# Patient Record
Sex: Female | Born: 2000
Health system: Southern US, Community
[De-identification: ages and names within clinical notes are randomized; demographics above are authoritative.]

## PROBLEM LIST (undated history)

## (undated) DIAGNOSIS — D649 Anemia, unspecified: Secondary | ICD-10-CM

## (undated) DIAGNOSIS — G4730 Sleep apnea, unspecified: Secondary | ICD-10-CM

## (undated) DIAGNOSIS — A6000 Herpesviral infection of urogenital system, unspecified: Secondary | ICD-10-CM

## (undated) DIAGNOSIS — K219 Gastro-esophageal reflux disease without esophagitis: Secondary | ICD-10-CM

## (undated) DIAGNOSIS — R0602 Shortness of breath: Secondary | ICD-10-CM

## (undated) DIAGNOSIS — R7303 Prediabetes: Secondary | ICD-10-CM

## (undated) DIAGNOSIS — R519 Headache, unspecified: Secondary | ICD-10-CM

## (undated) DIAGNOSIS — J45909 Unspecified asthma, uncomplicated: Secondary | ICD-10-CM

## (undated) DIAGNOSIS — R5383 Other fatigue: Secondary | ICD-10-CM

## (undated) DIAGNOSIS — T7840XA Allergy, unspecified, initial encounter: Secondary | ICD-10-CM

## (undated) DIAGNOSIS — E282 Polycystic ovarian syndrome: Secondary | ICD-10-CM

## (undated) DIAGNOSIS — Z87898 Personal history of other specified conditions: Secondary | ICD-10-CM

## (undated) HISTORY — PX: TONSILLECTOMY: SUR1361

## (undated) HISTORY — DX: Unspecified asthma, uncomplicated: J45.909

## (undated) HISTORY — DX: Other fatigue: R53.83

## (undated) HISTORY — DX: Sleep apnea, unspecified: G47.30

## (undated) HISTORY — DX: Anemia, unspecified: D64.9

## (undated) HISTORY — DX: Allergy, unspecified, initial encounter: T78.40XA

## (undated) HISTORY — DX: Shortness of breath: R06.02

## (undated) HISTORY — DX: Polycystic ovarian syndrome: E28.2

## (undated) HISTORY — DX: Herpesviral infection of urogenital system, unspecified: A60.00

## (undated) HISTORY — DX: Headache, unspecified: R51.9

## (undated) HISTORY — DX: Prediabetes: R73.03

## (undated) HISTORY — DX: Gastro-esophageal reflux disease without esophagitis: K21.9

---
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
## 2018-02-22 ENCOUNTER — Other Ambulatory Visit: Payer: Self-pay

## 2018-02-22 ENCOUNTER — Encounter (HOSPITAL_COMMUNITY): Payer: Self-pay

## 2018-02-22 ENCOUNTER — Emergency Department (HOSPITAL_COMMUNITY)
Admission: EM | Admit: 2018-02-22 | Discharge: 2018-02-22 | Disposition: A | Discharge status: Home / Self Care | Payer: Medicaid Other | Attending: Emergency Medicine | Admitting: Emergency Medicine

## 2018-02-22 DIAGNOSIS — J029 Acute pharyngitis, unspecified: Secondary | ICD-10-CM | POA: Diagnosis present

## 2018-02-22 DIAGNOSIS — R69 Illness, unspecified: Secondary | ICD-10-CM

## 2018-02-22 DIAGNOSIS — J111 Influenza due to unidentified influenza virus with other respiratory manifestations: Secondary | ICD-10-CM | POA: Insufficient documentation

## 2018-02-22 LAB — GROUP A STREP BY PCR: GROUP A STREP BY PCR: NOT DETECTED

## 2018-02-22 MED ORDER — ACETAMINOPHEN 325 MG PO TABS
650.0000 mg | ORAL_TABLET | Freq: Once | ORAL | Status: AC
  Administered 2018-02-22: 650 mg via ORAL
  Filled 2018-02-22: qty 2

## 2018-02-22 MED ORDER — KETOROLAC TROMETHAMINE 30 MG/ML IJ SOLN
30.0000 mg | Freq: Once | INTRAMUSCULAR | Status: AC
  Administered 2018-02-22: 30 mg via INTRAMUSCULAR
  Filled 2018-02-22: qty 1

## 2018-02-22 NOTE — ED Triage Notes (Signed)
Pt states she has had a sore throat x 2 days. Pt also states generalized abdominal pain. Pt states she has been cold.  Pt states no relief with Nyquil and Robotussin.

## 2018-02-22 NOTE — Discharge Instructions (Signed)
Please read and follow all provided instructions.  Your diagnoses today include:  1. Influenza-like illness     Tests performed today include:  Strep test - negative  Vital signs. See below for your results today.   Medications prescribed:   Naproxen - anti-inflammatory pain medication  Do not exceed 500mg  naproxen every 12 hours, take with food  You have been prescribed an anti-inflammatory medication or NSAID. Take with food. Take smallest effective dose for the shortest duration needed for your pain. Stop taking if you experience stomach pain or vomiting.   Take any prescribed medications only as directed.  Home care instructions:  Follow any educational materials contained in this packet. Please continue drinking plenty of fluids. Use over-the-counter cold and flu medications as needed as directed on packaging for symptom relief. You may also use ibuprofen or tylenol as directed on packaging for pain or fever.   BE VERY CAREFUL not to take multiple medicines containing Tylenol (also called acetaminophen). Doing so can lead to an overdose which can damage your liver and cause liver failure and possibly death.   Follow-up instructions: Please follow-up with your primary care provider in the next 3 days for further evaluation of your symptoms.   Return instructions:   Please return to the Emergency Department if you experience worsening symptoms.  Please return if you have a high fever greater than 101 degrees not controlled with over-the-counter medications, persistent vomiting and cannot keep down fluids, or worsening trouble breathing.  Please return if you have any other emergent concerns.  Additional Information:  Your vital signs today were: BP 119/76 (BP Location: Left Arm)    Pulse (!) 119    Temp (!) 101.6 F (38.7 C) (Oral)    Resp 20    Ht 5\' 2"  (1.575 m)    Wt 95.3 kg    SpO2 100%    BMI 38.41 kg/m  If your blood pressure (BP) was elevated above 135/85 this  visit, please have this repeated by your doctor within one month.

## 2018-02-22 NOTE — ED Provider Notes (Signed)
[facility]-EMERGENCY DEPT Provider Note   CSN: [id number] Arrival date & [id number]: 02/22/18  0935     History   Chief Complaint Chief Complaint  Patient presents with  . Sore Throat  . Abdominal Pain    HPI Peggy Garza is a 18 y.o. female.  Patient presents emergency department with flulike symptoms over the past 2 days.  Patient complains of sore throat, body aches, fever/chills, and left upper quadrant abdominal pain.  No nausea, vomiting, or diarrhea.  She denies cough or chest pain.  She has been taking NyQuil and Robitussin without any improvement.  No known sick contacts.     History reviewed. No pertinent past medical history.  There are no active problems to display for this patient.   Past Surgical History:  Procedure Laterality Date  . TONSILLECTOMY       OB History   No obstetric history on file.      Home Medications    Prior to Admission medications   Not on File    Family History No family history on file.  Social History Social History   Tobacco Use  . Smoking status: Never Smoker  Substance Use Topics  . Alcohol use: Never    Frequency: Never  . Drug use: Never     Allergies   Patient has no known allergies.   Review of Systems Review of Systems  Constitutional: Positive for chills, fatigue and fever.  HENT: Positive for sore throat. Negative for congestion, ear pain, rhinorrhea and sinus pressure.   Eyes: Negative for redness.  Respiratory: Negative for cough and wheezing.   Cardiovascular: Negative for chest pain.  Gastrointestinal: Positive for abdominal pain. Negative for diarrhea, nausea and vomiting.  Genitourinary: Negative for dysuria.  Musculoskeletal: Positive for myalgias. Negative for neck stiffness.  Skin: Negative for rash.  Neurological: Negative for headaches.  Hematological: Negative for adenopathy.     Physical Exam Updated Vital Signs BP 119/76 (BP Location: Left Arm)   Pulse  (!) 119   Temp (!) 101.6 F (38.7 C) (Oral)   Resp 20   Ht 5\' 2"  (1.575 m)   Wt 95.3 kg   SpO2 100%   BMI 38.41 kg/m   Physical Exam Vitals signs and nursing note reviewed.  Constitutional:      Appearance: She is well-developed.  HENT:     Head: Normocephalic and atraumatic.     Right Ear: Tympanic membrane normal.     Left Ear: Tympanic membrane normal.     Mouth/Throat:     Mouth: Mucous membranes are moist.     Pharynx: Posterior oropharyngeal erythema present. No pharyngeal swelling or oropharyngeal exudate.     Tonsils: No tonsillar exudate or tonsillar abscesses.  Eyes:     General:        Right eye: No discharge.        Left eye: No discharge.     Conjunctiva/sclera: Conjunctivae normal.  Neck:     Musculoskeletal: Normal range of motion and neck supple.  Cardiovascular:     Rate and Rhythm: Regular rhythm. Tachycardia present.     Heart sounds: Normal heart sounds.  Pulmonary:     Effort: Pulmonary effort is normal.     Breath sounds: Normal breath sounds.  Abdominal:     Palpations: Abdomen is soft.     Tenderness: There is abdominal tenderness.     Comments: Minimal left upper quadrant tenderness  Skin:    General: Skin is warm and  dry.  Neurological:     Mental Status: She is alert.      ED Treatments / Results  Labs (all labs ordered are listed, but only abnormal results are displayed) Labs Reviewed  GROUP A STREP BY PCR    EKG None  Radiology No results found.  Procedures Procedures (including critical care time)  Medications Ordered in ED Medications  acetaminophen (TYLENOL) tablet 650 mg (650 mg Oral Given 02/22/18 1009)  ketorolac (TORADOL) 30 MG/ML injection 30 mg (30 mg Intramuscular Given 02/22/18 1009)     Initial Impression / Assessment and Plan / ED Course  I have reviewed the triage vital signs and the nursing notes.  Pertinent labs & imaging results that were available during my care of the patient were reviewed by me and  considered in my medical decision making (see chart for details).     Patient seen and examined. Work-up initiated --we will check strep test is primary complaint is sore throat today. Medications ordered.   Vital signs reviewed and are as follows: BP 119/76 (BP Location: Left Arm)   Pulse (!) 119   Temp (!) 101.6 F (38.7 C) (Oral)   Resp 20   Ht 5\' 2"  (1.575 m)   Wt 95.3 kg   SpO2 100%   BMI 38.41 kg/m   11:54 AM patient updated on negative strep test.  Fever is improving.  Encouraged to rest and drink plenty of fluids.  Patient told to return to ED or see their primary doctor if their symptoms worsen, high fever not controlled with tylenol, persistent vomiting, they feel they are dehydrated, or if they have any other concerns.  Patient verbalized understanding and agreed with plan.     Final Clinical Impressions(s) / ED Diagnoses   Final diagnoses:  Influenza-like illness   Patient with symptoms consistent with influenza. Strep neg.  No sign of PTA.  Vitals are stable, low-grade fever. No signs of dehydration, tolerating PO's. Lungs are clear. Supportive therapy indicated with return if symptoms worsen. Patient counseled.   ED Discharge Orders    None       Renne Crigler, Cordelia Poche 02/22/18 1525    Azalia Bilis, MD 02/22/18 [phone]

## 2018-04-06 ENCOUNTER — Encounter: Payer: Self-pay | Admitting: Obstetrics & Gynecology

## 2018-04-27 ENCOUNTER — Encounter: Payer: Self-pay | Admitting: *Deleted

## 2018-08-24 ENCOUNTER — Telehealth: Payer: Self-pay | Admitting: Family Medicine

## 2018-08-24 NOTE — Telephone Encounter (Signed)
Patient called in stating that she needs to reschedule her appointment because she has to work. Other appointment day/times were give to the patient but they did not work for her. Patient verbalized that she will just keep her appointment on 7/16 and work it out with her job. Patient instructed to give the office a call back if it does not work out so she can be rescheduled.

## 2018-08-26 ENCOUNTER — Encounter: Payer: Medicaid Other | Admitting: Obstetrics & Gynecology

## 2018-08-26 ENCOUNTER — Encounter: Payer: Self-pay | Admitting: Obstetrics & Gynecology

## 2018-10-21 ENCOUNTER — Encounter: Payer: Medicaid Other | Admitting: Obstetrics & Gynecology

## 2018-11-10 ENCOUNTER — Telehealth: Payer: Self-pay | Admitting: Women's Health

## 2018-11-10 NOTE — Telephone Encounter (Signed)
Attempted to call patient about her appointment on 10/1 @ 10:35. No answer left voicemail instructing patient to wear a face mask for the entire appointment and no visitors are allowed during the visit. Patient instructed not to attend the appointment if she was any symptoms. Symptom list and office number left.  °

## 2018-11-11 ENCOUNTER — Encounter: Payer: Medicaid Other | Admitting: Women's Health

## 2019-03-01 DIAGNOSIS — A6000 Herpesviral infection of urogenital system, unspecified: Secondary | ICD-10-CM | POA: Insufficient documentation

## 2019-03-01 DIAGNOSIS — K8020 Calculus of gallbladder without cholecystitis without obstruction: Secondary | ICD-10-CM | POA: Insufficient documentation

## 2019-03-28 ENCOUNTER — Ambulatory Visit (INDEPENDENT_AMBULATORY_CARE_PROVIDER_SITE_OTHER): Payer: 59 | Admitting: Family Medicine

## 2019-03-28 ENCOUNTER — Encounter: Payer: Self-pay | Admitting: Family Medicine

## 2019-03-28 ENCOUNTER — Other Ambulatory Visit: Payer: Self-pay

## 2019-03-28 ENCOUNTER — Other Ambulatory Visit (HOSPITAL_COMMUNITY)
Admission: RE | Admit: 2019-03-28 | Discharge: 2019-03-28 | Disposition: A | Discharge status: Home / Self Care | Payer: 59 | Source: Ambulatory Visit | Attending: Family Medicine | Admitting: Family Medicine

## 2019-03-28 VITALS — BP 115/76 | HR 99 | Wt 223.5 lb

## 2019-03-28 DIAGNOSIS — E6609 Other obesity due to excess calories: Secondary | ICD-10-CM | POA: Diagnosis not present

## 2019-03-28 DIAGNOSIS — Z23 Encounter for immunization: Secondary | ICD-10-CM

## 2019-03-28 DIAGNOSIS — E282 Polycystic ovarian syndrome: Secondary | ICD-10-CM

## 2019-03-28 DIAGNOSIS — N898 Other specified noninflammatory disorders of vagina: Secondary | ICD-10-CM | POA: Insufficient documentation

## 2019-03-28 MED ORDER — METFORMIN HCL 500 MG PO TABS: ORAL_TABLET | ORAL | 5 refills | Status: DC

## 2019-03-28 MED ORDER — CLOTRIMAZOLE-BETAMETHASONE 1-0.05 % EX CREA: 1.0000 "application " | TOPICAL_CREAM | Freq: Two times a day (BID) | CUTANEOUS | 0 refills | Status: DC

## 2019-03-28 MED ORDER — NORETHIN ACE-ETH ESTRAD-FE 1-20 MG-MCG(24) PO TABS: 1.0000 | ORAL_TABLET | Freq: Every day | ORAL | 3 refills | Status: DC

## 2019-03-28 NOTE — Progress Notes (Signed)
Started menstruating at 19 yrs old. Began depo at approx 19 yrs old; pt reports being on Depo Provera for approx 3-4 years. Pt reports bleeding for 3 months after discontinuing Depo in 2018. No period since that time. Pt reports trying for pregnancy for 1 year.  Reports taking Provera twice about a year ago with no period afterwards.  Fleet Contras RN  03/28/19

## 2019-03-28 NOTE — Patient Instructions (Signed)

## 2019-03-28 NOTE — Progress Notes (Signed)
   Subjective:    Patient ID: Peggy Garza, female    DOB: Apr 15, 2000, 19 y.o.   MRN: [id number]  HPI  Patient is a G0, seen for secondary amenorrhea. Has history of irregular menses, starting as teenager. Started menses at age 41, menses every 3 months. At 14, was started on depo. Was on depo for a couple of years, but got off at age 44. Bleed for 3 months, then hasn't had menses since then. Had two provera challenges - about a year apart. No bleeding afterwards and no bleeding in between.  Has history of hair growth on chin and neck since teenager. Has strong family history of hirsutism with irregular cycles.   No galactorrhea, no new medications. No weight loss, weight gain.  I have reviewed the patients past medical, family, and social history.  I have reviewed the patient's medication list and allergies.   Review of Systems  BP 115/76   Pulse 99   Wt 223 lb 8 oz (101.4 kg)   BMI 40.88 kg/m      Objective:   Physical Exam Vitals reviewed.  Constitutional:      Appearance: Normal appearance.  HENT:     Head: Normocephalic and atraumatic.  Cardiovascular:     Rate and Rhythm: Normal rate and regular rhythm.     Pulses: Normal pulses.     Heart sounds: Normal heart sounds.  Pulmonary:     Effort: Pulmonary effort is normal.     Breath sounds: Normal breath sounds.  Abdominal:     General: Abdomen is flat. Bowel sounds are normal.     Palpations: Abdomen is soft.  Skin:    General: Skin is warm and dry.     Capillary Refill: Capillary refill takes less than 2 seconds.     Comments: Moderate Hirsutism on chin and neck  Neurological:     General: No focal deficit present.     Mental Status: She is alert.  Psychiatric:        Mood and Affect: Mood normal.        Behavior: Behavior normal.        Thought Content: Thought content normal.        Judgment: Judgment normal.       Assessment & Plan:  1. PCOS (polycystic ovarian syndrome) Check TSH, testosterone,  HgA1c. Discussed weight loss, even 5-10 pounds can improve testosterone levels and fertility. Patient does want to get pregnant in the next couple of years. Start metformin and OCPs. Will refer to obesity medicine (Dr Rinaldo Ratel). Will also refer to nutritionist.  - TSH - TestT+TestF+SHBG - Hemoglobin A1c - Ambulatory referral to [facility] - Referral to Nutrition and Diabetes Services  2. Obesity due to excess calories without serious comorbidity, unspecified classification - Ambulatory referral to [facility] - Referral to Nutrition and Diabetes Services  3. Vaginal discharge - Cervicovaginal ancillary only( [place])

## 2019-03-31 ENCOUNTER — Other Ambulatory Visit: Payer: Medicaid Other

## 2019-03-31 LAB — CERVICOVAGINAL ANCILLARY ONLY
Bacterial Vaginitis (gardnerella): POSITIVE — AB
Candida Glabrata: NEGATIVE
Candida Vaginitis: NEGATIVE
Chlamydia: NEGATIVE
Comment: NEGATIVE
Comment: NEGATIVE
Comment: NEGATIVE
Comment: NEGATIVE
Comment: NEGATIVE
Comment: NORMAL
Neisseria Gonorrhea: NEGATIVE
Trichomonas: NEGATIVE

## 2019-04-01 LAB — TESTT+TESTF+SHBG
Sex Hormone Binding: 26.8 nmol/L (ref 24.6–122.0)
Testosterone, Free: 6.1 pg/mL — ABNORMAL HIGH (ref 0.0–4.2)
Testosterone, Total, LC/MS: 108.8 ng/dL — ABNORMAL HIGH (ref 10.0–55.0)

## 2019-04-01 LAB — TSH: TSH: 0.516 u[IU]/mL (ref 0.450–4.500)

## 2019-04-01 LAB — HEMOGLOBIN A1C
Est. average glucose Bld gHb Est-mCnc: 117 mg/dL
Hgb A1c MFr Bld: 5.7 % — ABNORMAL HIGH (ref 4.8–5.6)

## 2019-04-04 ENCOUNTER — Telehealth: Payer: Self-pay | Admitting: Lactation Services

## 2019-04-04 ENCOUNTER — Telehealth (HOSPITAL_COMMUNITY): Payer: Self-pay | Admitting: Lactation Services

## 2019-04-04 ENCOUNTER — Telehealth: Payer: Self-pay | Admitting: *Deleted

## 2019-04-04 ENCOUNTER — Other Ambulatory Visit: Payer: Self-pay | Admitting: Family Medicine

## 2019-04-04 MED ORDER — METRONIDAZOLE 500 MG PO TABS: 500.0000 mg | ORAL_TABLET | Freq: Two times a day (BID) | ORAL | 0 refills | Status: DC

## 2019-04-04 NOTE — Telephone Encounter (Signed)
-----   Message from Levie Heritage, DO sent at 04/04/2019 11:49 AM EST ----- Regarding: RE: Treatment for BV? Thank you - I sent a prescription for flagyl to the pharmacy for her. No alcohol while on this medication. ----- Message ----- From: Ed Blalock, RN Sent: 04/04/2019   9:52 AM EST To: Levie Heritage, DO Subject: Treatment for BV?                              Dr. Adrian Blackwater,   Do you advise treatment for BV? I attempted to reach patient earlier today with other results and left a message for her to call back.   Thanks Jasmine December, RN

## 2019-04-04 NOTE — Telephone Encounter (Signed)
Received voicemail message stating she is calling for results.  Jakell Trusty,RN

## 2019-04-04 NOTE — Telephone Encounter (Signed)
-----   Message from Levie Heritage, DO sent at 04/01/2019 12:47 PM EST ----- Patient has PCOS and pre-diabetes. Continue with metformin and OCPs. Please let patient know.

## 2019-04-04 NOTE — Telephone Encounter (Signed)
Called patient and informed her that she is prediabetic and has PCOS and to keep taking OCP's and Metformin. . Patient informed she has BV and antibiotics called into pharmacy. Pt advised not to drink alcohol while taking Flagyl. She reports she has frequent BV but it is not usually bothersome. She reports she uses Dove soap and wears Polyester underwear. She does not douche. Advised her to try 100% cotton underwear to see if that helps. Pt to let us know if she has symptoms after taking ATB. Pt voiced understanding to all the above.

## 2019-04-04 NOTE — Telephone Encounter (Signed)
Called patient to inform her of her lab results and recommendations. Pt did not answer. Left message for patient to call the office for her lab results.

## 2019-04-04 NOTE — Telephone Encounter (Signed)
This was addressed in another telephone encounter also dated today. Kanan Sobek,RN

## 2019-06-15 DIAGNOSIS — Z0000 Encounter for general adult medical examination without abnormal findings: Secondary | ICD-10-CM | POA: Diagnosis not present

## 2019-06-15 DIAGNOSIS — Z124 Encounter for screening for malignant neoplasm of cervix: Secondary | ICD-10-CM | POA: Diagnosis not present

## 2019-06-15 DIAGNOSIS — E282 Polycystic ovarian syndrome: Secondary | ICD-10-CM | POA: Insufficient documentation

## 2019-06-15 DIAGNOSIS — E669 Obesity, unspecified: Secondary | ICD-10-CM | POA: Diagnosis not present

## 2019-06-22 DIAGNOSIS — R7303 Prediabetes: Secondary | ICD-10-CM | POA: Insufficient documentation

## 2019-06-22 DIAGNOSIS — E559 Vitamin D deficiency, unspecified: Secondary | ICD-10-CM | POA: Insufficient documentation

## 2019-07-14 ENCOUNTER — Ambulatory Visit: Payer: 59 | Admitting: Family Medicine

## 2019-08-13 DIAGNOSIS — N63 Unspecified lump in unspecified breast: Secondary | ICD-10-CM | POA: Diagnosis not present

## 2019-08-13 DIAGNOSIS — A609 Anogenital herpesviral infection, unspecified: Secondary | ICD-10-CM | POA: Diagnosis not present

## 2019-08-13 DIAGNOSIS — Z309 Encounter for contraceptive management, unspecified: Secondary | ICD-10-CM | POA: Diagnosis not present

## 2019-08-29 ENCOUNTER — Other Ambulatory Visit: Payer: Self-pay | Admitting: Urgent Care

## 2019-08-29 ENCOUNTER — Ambulatory Visit
Admission: RE | Admit: 2019-08-29 | Discharge: 2019-08-29 | Disposition: A | Discharge status: Home / Self Care | Payer: BC Managed Care – PPO | Source: Ambulatory Visit | Attending: Urgent Care | Admitting: Urgent Care

## 2019-08-29 ENCOUNTER — Other Ambulatory Visit: Payer: Self-pay

## 2019-08-29 DIAGNOSIS — N63 Unspecified lump in unspecified breast: Secondary | ICD-10-CM

## 2019-08-29 DIAGNOSIS — N611 Abscess of the breast and nipple: Secondary | ICD-10-CM

## 2019-08-29 DIAGNOSIS — N6310 Unspecified lump in the right breast, unspecified quadrant: Secondary | ICD-10-CM | POA: Diagnosis not present

## 2019-08-29 DIAGNOSIS — N61 Mastitis without abscess: Secondary | ICD-10-CM | POA: Diagnosis not present

## 2019-09-09 ENCOUNTER — Other Ambulatory Visit: Payer: Self-pay

## 2019-09-09 ENCOUNTER — Ambulatory Visit
Admission: RE | Admit: 2019-09-09 | Discharge: 2019-09-09 | Disposition: A | Discharge status: Home / Self Care | Payer: BC Managed Care – PPO | Source: Ambulatory Visit | Attending: Urgent Care | Admitting: Urgent Care

## 2019-09-09 DIAGNOSIS — N611 Abscess of the breast and nipple: Secondary | ICD-10-CM

## 2019-10-19 DIAGNOSIS — Z6841 Body Mass Index (BMI) 40.0 and over, adult: Secondary | ICD-10-CM | POA: Diagnosis not present

## 2019-10-19 DIAGNOSIS — G43909 Migraine, unspecified, not intractable, without status migrainosus: Secondary | ICD-10-CM | POA: Diagnosis not present

## 2019-10-19 DIAGNOSIS — N644 Mastodynia: Secondary | ICD-10-CM | POA: Diagnosis not present

## 2019-10-21 ENCOUNTER — Other Ambulatory Visit: Payer: Self-pay

## 2019-10-21 ENCOUNTER — Other Ambulatory Visit: Payer: Self-pay | Admitting: Family

## 2019-10-21 DIAGNOSIS — N644 Mastodynia: Secondary | ICD-10-CM

## 2019-11-03 ENCOUNTER — Other Ambulatory Visit: Payer: BC Managed Care – PPO

## 2019-11-07 ENCOUNTER — Ambulatory Visit: Payer: BC Managed Care – PPO | Admitting: Family Medicine

## 2019-12-09 ENCOUNTER — Other Ambulatory Visit: Payer: BC Managed Care – PPO

## 2019-12-20 ENCOUNTER — Other Ambulatory Visit: Payer: BC Managed Care – PPO

## 2020-01-13 ENCOUNTER — Other Ambulatory Visit: Payer: BC Managed Care – PPO

## 2020-01-25 ENCOUNTER — Ambulatory Visit
Admission: RE | Admit: 2020-01-25 | Discharge: 2020-01-25 | Disposition: A | Discharge status: Home / Self Care | Payer: BC Managed Care – PPO | Source: Ambulatory Visit | Attending: Family | Admitting: Family

## 2020-01-25 ENCOUNTER — Other Ambulatory Visit: Payer: Self-pay

## 2020-01-25 DIAGNOSIS — N6489 Other specified disorders of breast: Secondary | ICD-10-CM | POA: Diagnosis not present

## 2020-01-25 DIAGNOSIS — N644 Mastodynia: Secondary | ICD-10-CM

## 2020-01-26 ENCOUNTER — Ambulatory Visit: Payer: Medicaid Other | Admitting: Neurology

## 2020-01-26 DIAGNOSIS — Z209 Contact with and (suspected) exposure to unspecified communicable disease: Secondary | ICD-10-CM | POA: Diagnosis not present

## 2020-01-28 DIAGNOSIS — M79661 Pain in right lower leg: Secondary | ICD-10-CM | POA: Diagnosis not present

## 2020-01-28 DIAGNOSIS — Z041 Encounter for examination and observation following transport accident: Secondary | ICD-10-CM | POA: Diagnosis not present

## 2020-01-28 DIAGNOSIS — M79674 Pain in right toe(s): Secondary | ICD-10-CM | POA: Diagnosis not present

## 2020-02-25 DIAGNOSIS — N61 Mastitis without abscess: Secondary | ICD-10-CM | POA: Diagnosis not present

## 2020-02-28 ENCOUNTER — Ambulatory Visit (INDEPENDENT_AMBULATORY_CARE_PROVIDER_SITE_OTHER): Payer: Medicaid Other | Admitting: Family Medicine

## 2020-03-06 ENCOUNTER — Encounter (INDEPENDENT_AMBULATORY_CARE_PROVIDER_SITE_OTHER): Payer: Self-pay | Admitting: Family Medicine

## 2020-03-06 ENCOUNTER — Other Ambulatory Visit: Payer: Self-pay

## 2020-03-06 ENCOUNTER — Other Ambulatory Visit: Payer: Self-pay | Admitting: Urgent Care

## 2020-03-06 ENCOUNTER — Ambulatory Visit
Admission: RE | Admit: 2020-03-06 | Discharge: 2020-03-06 | Disposition: A | Discharge status: Home / Self Care | Payer: BC Managed Care – PPO | Source: Ambulatory Visit | Attending: Urgent Care | Admitting: Urgent Care

## 2020-03-06 ENCOUNTER — Ambulatory Visit (INDEPENDENT_AMBULATORY_CARE_PROVIDER_SITE_OTHER): Payer: BC Managed Care – PPO | Admitting: Family Medicine

## 2020-03-06 VITALS — BP 94/65 | HR 83 | Temp 98.3°F | Ht 61.0 in | Wt 239.0 lb

## 2020-03-06 DIAGNOSIS — Z1331 Encounter for screening for depression: Secondary | ICD-10-CM | POA: Diagnosis not present

## 2020-03-06 DIAGNOSIS — R739 Hyperglycemia, unspecified: Secondary | ICD-10-CM | POA: Diagnosis not present

## 2020-03-06 DIAGNOSIS — N611 Abscess of the breast and nipple: Secondary | ICD-10-CM

## 2020-03-06 DIAGNOSIS — Z9189 Other specified personal risk factors, not elsewhere classified: Secondary | ICD-10-CM

## 2020-03-06 DIAGNOSIS — R5383 Other fatigue: Secondary | ICD-10-CM

## 2020-03-06 DIAGNOSIS — Z0289 Encounter for other administrative examinations: Secondary | ICD-10-CM

## 2020-03-06 DIAGNOSIS — R0602 Shortness of breath: Secondary | ICD-10-CM | POA: Diagnosis not present

## 2020-03-06 DIAGNOSIS — Z6841 Body Mass Index (BMI) 40.0 and over, adult: Secondary | ICD-10-CM

## 2020-03-07 LAB — T3: T3, Total: 154 ng/dL (ref 71–180)

## 2020-03-07 LAB — COMPREHENSIVE METABOLIC PANEL
ALT: 8 IU/L (ref 0–32)
AST: 18 IU/L (ref 0–40)
Albumin/Globulin Ratio: 1 — ABNORMAL LOW (ref 1.2–2.2)
Albumin: 3.8 g/dL — ABNORMAL LOW (ref 3.9–5.0)
Alkaline Phosphatase: 49 IU/L (ref 44–121)
BUN/Creatinine Ratio: 10 (ref 9–23)
BUN: 8 mg/dL (ref 6–20)
Bilirubin Total: 0.3 mg/dL (ref 0.0–1.2)
CO2: 22 mmol/L (ref 20–29)
Calcium: 9.3 mg/dL (ref 8.7–10.2)
Chloride: 104 mmol/L (ref 96–106)
Creatinine, Ser: 0.79 mg/dL (ref 0.57–1.00)
GFR calc Af Amer: 124 mL/min/{1.73_m2} (ref >59)
GFR calc non Af Amer: 107 mL/min/{1.73_m2} (ref >59)
Globulin, Total: 4 g/dL (ref 1.5–4.5)
Glucose: 84 mg/dL (ref 65–99)
Potassium: 4.3 mmol/L (ref 3.5–5.2)
Sodium: 138 mmol/L (ref 134–144)
Total Protein: 7.8 g/dL (ref 6.0–8.5)

## 2020-03-07 LAB — CBC WITH DIFFERENTIAL/PLATELET
Basophils Absolute: 0 10*3/uL (ref 0.0–0.2)
Basos: 1 %
EOS (ABSOLUTE): 0.1 10*3/uL (ref 0.0–0.4)
Eos: 2 %
Hematocrit: 36.8 % (ref 34.0–46.6)
Hemoglobin: 11.3 g/dL (ref 11.1–15.9)
Immature Grans (Abs): 0 10*3/uL (ref 0.0–0.1)
Immature Granulocytes: 0 %
Lymphocytes Absolute: 3 10*3/uL (ref 0.7–3.1)
Lymphs: 49 %
MCH: 25.3 pg — ABNORMAL LOW (ref 26.6–33.0)
MCHC: 30.7 g/dL — ABNORMAL LOW (ref 31.5–35.7)
MCV: 82 fL (ref 79–97)
Monocytes Absolute: 0.4 10*3/uL (ref 0.1–0.9)
Monocytes: 6 %
Neutrophils Absolute: 2.6 10*3/uL (ref 1.4–7.0)
Neutrophils: 42 %
Platelets: 283 10*3/uL (ref 150–450)
RBC: 4.47 x10E6/uL (ref 3.77–5.28)
RDW: 14.1 % (ref 11.7–15.4)
WBC: 6.1 10*3/uL (ref 3.4–10.8)

## 2020-03-07 LAB — LIPID PANEL WITH LDL/HDL RATIO
Cholesterol, Total: 150 mg/dL (ref 100–199)
HDL: 43 mg/dL (ref >39)
LDL Chol Calc (NIH): 88 mg/dL (ref 0–99)
LDL/HDL Ratio: 2 ratio (ref 0.0–3.2)
Triglycerides: 103 mg/dL (ref 0–149)
VLDL Cholesterol Cal: 19 mg/dL (ref 5–40)

## 2020-03-07 LAB — INSULIN, RANDOM: INSULIN: 28.7 u[IU]/mL — ABNORMAL HIGH (ref 2.6–24.9)

## 2020-03-07 LAB — TSH: TSH: 0.575 u[IU]/mL (ref 0.450–4.500)

## 2020-03-07 LAB — HEMOGLOBIN A1C
Est. average glucose Bld gHb Est-mCnc: 126 mg/dL
Hgb A1c MFr Bld: 6 % — ABNORMAL HIGH (ref 4.8–5.6)

## 2020-03-07 LAB — VITAMIN D 25 HYDROXY (VIT D DEFICIENCY, FRACTURES): Vit D, 25-Hydroxy: 31.6 ng/mL (ref 30.0–100.0)

## 2020-03-07 LAB — FOLATE: Folate: 16.4 ng/mL (ref >3.0)

## 2020-03-07 LAB — VITAMIN B12: Vitamin B-12: 262 pg/mL (ref 232–1245)

## 2020-03-07 LAB — T4: T4, Total: 9.5 ug/dL (ref 4.5–12.0)

## 2020-03-07 NOTE — Progress Notes (Signed)
Chief Complaint:   OBESITY Peggy Garza (MR# [id number]) is a 20 y.o. female who presents for evaluation and treatment of obesity and related comorbidities. Current BMI is Body mass index is 45.16 kg/m. Peggy Garza has been struggling with her weight for many years and has been unsuccessful in either losing weight, maintaining weight loss, or reaching her healthy weight goal.  Peggy Garza states she is a very picky eater, and this limits her food choices. She especially doesn't like to eat fruit or vegetables.  Peggy Garza is currently in the action stage of change and ready to dedicate time achieving and maintaining a healthier weight. Peggy Garza is interested in becoming our patient and working on intensive lifestyle modifications including (but not limited to) diet and exercise for weight loss.  Peggy Garza's habits were reviewed today and are as follows: her desired weight loss is 59 lbs, she has been heavy most of her life, she started gaining weight after the depot shot, her heaviest weight ever was 247 pounds, she is a picky eater and doesn't like to eat healthier foods, she has significant food cravings issues, she skips meals frequently, she frequently makes poor food choices and she struggles with emotional eating.  Depression Screen Peggy Garza's Food and Mood (modified PHQ-9) score was 16.  Depression screen [facility] 2/9 03/06/2020  Decreased Interest 2  Down, Depressed, Hopeless 1  PHQ - 2 Score 3  Altered sleeping 3  Tired, decreased energy 3  Change in appetite 1  Feeling bad or failure about yourself  3  Trouble concentrating 3  Moving slowly or fidgety/restless 0  Suicidal thoughts -  PHQ-9 Score 16   Subjective:   1. Other fatigue Peggy Garza admits to daytime somnolence and admits to waking up still tired. Patent has a history of symptoms of daytime fatigue and morning headache. Peggy Garza generally gets 5 hours of sleep per night, and states that she has generally restful sleep. Snoring is present.  Apneic episodes are present. Epworth Sleepiness Score is 15.  2. Shortness of breath on exertion Keyoni notes increasing shortness of breath with exercising and seems to be worsening over time with weight gain. She notes getting out of breath sooner with activity than she used to. This has not gotten worse recently. Peggy Garza denies shortness of breath at rest or orthopnea.  3. Hyperglycemia Peggy Garza is on metformin, but she takes this infrequently. She notes polyphagia sometimes but not always.  4. At risk for diabetes mellitus Peggy Garza is at higher than average risk for developing diabetes due to obesity.   Assessment/Plan:   1. Other fatigue Peggy Garza does feel that her weight is causing her energy to be lower than it should be. Fatigue may be related to obesity, depression or many other causes. Labs will be ordered, and in the meanwhile, Peggy Garza will focus on self care including making healthy food choices, increasing physical activity and focusing on stress reduction.  - Vitamin B12 - CBC with Differential/Platelet - EKG 12-Lead - Folate - T3 - T4 - TSH - VITAMIN D 25 Hydroxy (Vit-D Deficiency, Fractures)  2. Shortness of breath on exertion Peggy Garza does feel that she gets out of breath more easily that she used to when she exercises. Peggy Garza's shortness of breath appears to be obesity related and exercise induced. She has agreed to work on weight loss and gradually increase exercise to treat her exercise induced shortness of breath. Will continue to monitor closely.  - Lipid Panel With LDL/HDL Ratio  3. Hyperglycemia Fasting labs  will be obtained today, and results with be discussed with Corena in 2 weeks at her follow up visit. In the meanwhile Peggy Garza will start journaling and will work on weight loss efforts.  - Vitamin B12 - Comprehensive metabolic panel - Hemoglobin A1c - Insulin, random  4. Screening for depression Peggy Garza had a positive depression screening. Depression is commonly  associated with obesity and often results in emotional eating behaviors. We will monitor this closely and work on CBT to help improve the non-hunger eating patterns. Referral to Psychology may be required if no improvement is seen as she continues in our clinic.  5. At risk for diabetes mellitus Peggy Garza was given approximately 15 minutes of diabetes education and counseling today. We discussed intensive lifestyle modifications today with an emphasis on weight loss as well as increasing exercise and decreasing simple carbohydrates in her diet. We also reviewed medication options with an emphasis on risk versus benefit of those discussed.   Repetitive spaced learning was employed today to elicit superior memory formation and behavioral change.  6. Class 3 severe obesity with serious comorbidity and body mass index (BMI) of 45.0 to 49.9 in adult, unspecified obesity type (HCC) Peggy Garza is currently in the action stage of change and her goal is to continue with weight loss efforts. I recommend Peggy Garza begin the structured treatment plan as follows:  She has agreed to keeping a food journal and adhering to recommended goals of 1400-1700 calories and 85+ grams of protein daily.  Exercise goals: No exercise has been prescribed for now, while we concentrate on nutritional changes.  Behavioral modification strategies: meal planning and cooking strategies and keeping a strict food journal.  She was informed of the importance of frequent follow-up visits to maximize her success with intensive lifestyle modifications for her multiple health conditions. She was informed we would discuss her lab results at her next visit unless there is a critical issue that needs to be addressed sooner. Peggy Garza agreed to keep her next visit at the agreed upon time to discuss these results.  Objective:   Blood pressure 94/65, pulse 83, temperature 98.3 F (36.8 C), height 5\' 1"  (1.549 m), weight 239 lb (108.4 kg), SpO2 98 %. Body  mass index is 45.16 kg/m.  EKG: Normal sinus rhythm, rate 82 BPM.  Indirect Calorimeter completed today shows a VO2 of 292 and a REE of 2031.  Her calculated basal metabolic rate is 2032 thus her basal metabolic rate is better than expected.  General: Cooperative, alert, well developed, in no acute distress. HEENT: Conjunctivae and lids unremarkable. Cardiovascular: Regular rhythm.  Lungs: Normal work of breathing. Neurologic: No focal deficits.   Lab Results  Component Value Date   CREATININE 0.79 03/06/2020   BUN 8 03/06/2020   NA 138 03/06/2020   K 4.3 03/06/2020   CL 104 03/06/2020   CO2 22 03/06/2020   Lab Results  Component Value Date   ALT 8 03/06/2020   AST 18 03/06/2020   ALKPHOS 49 03/06/2020   BILITOT 0.3 03/06/2020   Lab Results  Component Value Date   HGBA1C 6.0 (H) 03/06/2020   HGBA1C 5.7 (H) 03/28/2019   Lab Results  Component Value Date   INSULIN 28.7 (H) 03/06/2020   Lab Results  Component Value Date   TSH 0.575 03/06/2020   Lab Results  Component Value Date   CHOL 150 03/06/2020   HDL 43 03/06/2020   LDLCALC 88 03/06/2020   TRIG 103 03/06/2020   Lab Results  Component  Value Date   WBC 6.1 03/06/2020   HGB 11.3 03/06/2020   HCT 36.8 03/06/2020   MCV 82 03/06/2020   PLT 283 03/06/2020   No results found for: IRON, TIBC, FERRITIN  Attestation Statements:   Reviewed by clinician on day of visit: allergies, medications, problem list, medical history, surgical history, family history, social history, and previous encounter notes.   I, Burt Knack, am acting as transcriptionist for Quillian Quince, MD.  I have reviewed the above documentation for accuracy and completeness, and I agree with the above. - Quillian Quince, MD

## 2020-03-13 ENCOUNTER — Ambulatory Visit (INDEPENDENT_AMBULATORY_CARE_PROVIDER_SITE_OTHER): Payer: Medicaid Other | Admitting: Family Medicine

## 2020-03-14 ENCOUNTER — Emergency Department (HOSPITAL_BASED_OUTPATIENT_CLINIC_OR_DEPARTMENT_OTHER)
Admission: EM | Admit: 2020-03-14 | Discharge: 2020-03-14 | Disposition: A | Discharge status: Home / Self Care | Payer: BC Managed Care – PPO | Attending: Emergency Medicine | Admitting: Emergency Medicine

## 2020-03-14 ENCOUNTER — Encounter (HOSPITAL_BASED_OUTPATIENT_CLINIC_OR_DEPARTMENT_OTHER): Payer: Self-pay | Admitting: *Deleted

## 2020-03-14 ENCOUNTER — Emergency Department (HOSPITAL_BASED_OUTPATIENT_CLINIC_OR_DEPARTMENT_OTHER): Payer: BC Managed Care – PPO

## 2020-03-14 ENCOUNTER — Other Ambulatory Visit: Payer: Self-pay

## 2020-03-14 DIAGNOSIS — M545 Low back pain, unspecified: Secondary | ICD-10-CM | POA: Insufficient documentation

## 2020-03-14 DIAGNOSIS — Y92481 Parking lot as the place of occurrence of the external cause: Secondary | ICD-10-CM | POA: Insufficient documentation

## 2020-03-14 LAB — PREGNANCY, URINE: Preg Test, Ur: NEGATIVE

## 2020-03-14 MED ORDER — METHOCARBAMOL 500 MG PO TABS: 500.0000 mg | ORAL_TABLET | Freq: Two times a day (BID) | ORAL | 0 refills | Status: DC

## 2020-03-14 MED ORDER — LIDOCAINE 5 % EX PTCH: 1.0000 | MEDICATED_PATCH | CUTANEOUS | 0 refills | Status: DC
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
## 2020-03-14 NOTE — Discharge Instructions (Addendum)
At this time there does not appear to be the presence of an emergent medical condition, however there is always the potential for conditions to change. Please read and follow the below instructions.  Please return to the Emergency Department immediately for any new or worsening symptoms. Please be sure to follow up with your Primary Care Provider within one week regarding your visit today; please call their office to schedule an appointment even if you are feeling better for a follow-up visit. You may use the muscle relaxer Robaxin as prescribed to help with your symptoms.  Do not drive or operate heavy machinery while taking Robaxin as it will make you drowsy.  Do not drink alcohol or take other sedating medications while taking Robaxin as this will worsen side effects. You may use the Lidoderm patch as prescribed to help with your symptoms.  Lidoderm may be expensive so you may speak with your pharmacist about finding over-the-counter medications that work similarly such as SalonPas. You may use over-the-counter anti-inflammatory such as Tylenol as directed on the packaging to help with your symptoms.  Go to the nearest Emergency Department immediately if: You have fever or chills You have: Loss of feeling (numbness), tingling, or weakness in your arms or legs. Very bad neck pain, especially tenderness in the middle of the back of your neck. A change in your ability to control your pee or poop (stool). More pain in any area of your body. Swelling in any area of your body, especially your legs. Shortness of breath or light-headedness. Chest pain. Blood in your pee, poop, or vomit. Very bad pain in your belly (abdomen) or your back. Very bad headaches or headaches that are getting worse. Sudden vision loss or double vision. Your eye suddenly turns red. The black center of your eye (pupil) is an odd shape or size. You have any new/concerning or worsening of symptoms   Please read the  additional information packets attached to your discharge summary.  Do not take your medicine if  develop an itchy rash, swelling in your mouth or lips, or difficulty breathing; call 911 and seek immediate emergency medical attention if this occurs.  You may review your lab tests and imaging results in their entirety on your MyChart account.  Please discuss all results of fully with your primary care provider and other specialist at your follow-up visit.  Note: Portions of this text may have been transcribed using voice recognition software. Every effort was made to ensure accuracy; however, inadvertent computerized transcription errors may still be present.

## 2020-03-14 NOTE — ED Notes (Signed)
ED Provider at bedside. 

## 2020-03-17 DIAGNOSIS — S299XXD Unspecified injury of thorax, subsequent encounter: Secondary | ICD-10-CM | POA: Diagnosis not present

## 2020-03-19 DIAGNOSIS — N6089 Other benign mammary dysplasias of unspecified breast: Secondary | ICD-10-CM | POA: Diagnosis not present

## 2020-03-19 DIAGNOSIS — N62 Hypertrophy of breast: Secondary | ICD-10-CM | POA: Diagnosis not present

## 2020-03-20 ENCOUNTER — Other Ambulatory Visit: Payer: Self-pay

## 2020-03-20 ENCOUNTER — Ambulatory Visit: Payer: Medicaid Other | Admitting: Neurology

## 2020-03-20 ENCOUNTER — Ambulatory Visit (INDEPENDENT_AMBULATORY_CARE_PROVIDER_SITE_OTHER): Payer: BC Managed Care – PPO | Admitting: Family Medicine

## 2020-03-20 ENCOUNTER — Encounter (INDEPENDENT_AMBULATORY_CARE_PROVIDER_SITE_OTHER): Payer: Self-pay | Admitting: Family Medicine

## 2020-03-20 VITALS — BP 117/68 | HR 82 | Temp 98.3°F | Ht 61.0 in | Wt 239.0 lb

## 2020-03-20 DIAGNOSIS — R7303 Prediabetes: Secondary | ICD-10-CM | POA: Diagnosis not present

## 2020-03-20 DIAGNOSIS — E538 Deficiency of other specified B group vitamins: Secondary | ICD-10-CM

## 2020-03-20 DIAGNOSIS — E559 Vitamin D deficiency, unspecified: Secondary | ICD-10-CM

## 2020-03-20 DIAGNOSIS — Z6841 Body Mass Index (BMI) 40.0 and over, adult: Secondary | ICD-10-CM

## 2020-03-20 MED ORDER — VITAMIN D (ERGOCALCIFEROL) 1.25 MG (50000 UNIT) PO CAPS: 50000.0000 [IU] | ORAL_CAPSULE | ORAL | 0 refills | Status: DC

## 2020-03-21 NOTE — Progress Notes (Signed)
Chief Complaint:   OBESITY Peggy Garza is here to discuss her progress with her obesity treatment plan along with follow-up of her obesity related diagnoses. Peggy Garza is on keeping a food journal and adhering to recommended goals of 1400-1700 calories and 85+ grams of protein daily and states she is following her eating plan approximately 0% of the time. Peggy Garza states she is doing 0 minutes 0 times per week.  Today's visit was #: 2 Starting weight: 239 lbs Starting date: 03/06/2020 Today's weight: 239 lbs Today's date: 03/20/2020 Total lbs lost to date: 0 Total lbs lost since last in-office visit: 0  Interim History: Peggy Garza started to keep a journal since our last visit. Her protein intake was low at 30 grams daily, and her carbohydrates were increased. She frequently ate too few calories which is likely decreased her RMR.  Subjective:   1. Pre-diabetes Peggy Garza's A1c is worsening and is elevated now to 6.0. She denies morning polyphagia. She has a history of GI upset with metformin but also traditionally has eaten too many simple carbohydrates. I discussed labs with the patient today.  2. Vitamin D deficiency Peggy Garza is on Vit D, and her level is very low. She notes fatigue. I discussed labs with the patient today.  3. B12 deficiency Peggy Garza's B12 is lower than goal. She denies a history of anemia and or GI surgery, but she eats very little meat. I discussed labs with the patient today.  Assessment/Plan:   1. Pre-diabetes Peggy Garza will restart metformin at 1/2 tablet PO q daily (250 mg q daily with food), and decrease simple carbohydrates to help decrease the risk of diabetes.   2. Vitamin D deficiency Low Vitamin D level contributes to fatigue and are associated with obesity, breast, and colon cancer. Peggy Garza agreed to start prescription Vitamin D 50,000 IU every week with no refills. She will follow-up for routine testing of Vitamin D, at least 2-3 times per year to avoid  over-replacement.  - Vitamin D, Ergocalciferol, (DRISDOL) 1.25 MG (50000 UNIT) CAPS capsule; Take 1 capsule (50,000 Units total) by mouth every 7 (seven) days.  Dispense: 4 capsule; Refill: 0  3. B12 deficiency The diagnosis was reviewed with the patient. Peggy Garza is now on a B12 rich diet, and we will recheck labs in 3 months. Orders and follow up as documented in patient record.  4. Class 3 severe obesity with serious comorbidity and body mass index (BMI) of 45.0 to 49.9 in adult, unspecified obesity type (HCC) Peggy Garza is currently in the action stage of change. As such, her goal is to continue with weight loss efforts. She has agreed to keeping a food journal and adhering to recommended goals of 1400-1700 calories and 85+ grams of protein daily.   Peggy Garza is ok to use protein supplements for now to help increase her RMR.  Behavioral modification strategies: increasing lean protein intake and no skipping meals.  Peggy Garza has agreed to follow-up with our clinic in 2 weeks. She was informed of the importance of frequent follow-up visits to maximize her success with intensive lifestyle modifications for her multiple health conditions.   Objective:   Blood pressure 117/68, pulse 82, temperature 98.3 F (36.8 C), height 5\' 1"  (1.549 m), weight 239 lb (108.4 kg), SpO2 98 %. Body mass index is 45.16 kg/m.  General: Cooperative, alert, well developed, in no acute distress. HEENT: Conjunctivae and lids unremarkable. Cardiovascular: Regular rhythm.  Lungs: Normal work of breathing. Neurologic: No focal deficits.   Lab Results  Component Value Date   CREATININE 0.79 03/06/2020   BUN 8 03/06/2020   NA 138 03/06/2020   K 4.3 03/06/2020   CL 104 03/06/2020   CO2 22 03/06/2020   Lab Results  Component Value Date   ALT 8 03/06/2020   AST 18 03/06/2020   ALKPHOS 49 03/06/2020   BILITOT 0.3 03/06/2020   Lab Results  Component Value Date   HGBA1C 6.0 (H) 03/06/2020   HGBA1C 5.7 (H)  03/28/2019   Lab Results  Component Value Date   INSULIN 28.7 (H) 03/06/2020   Lab Results  Component Value Date   TSH 0.575 03/06/2020   Lab Results  Component Value Date   CHOL 150 03/06/2020   HDL 43 03/06/2020   LDLCALC 88 03/06/2020   TRIG 103 03/06/2020   Lab Results  Component Value Date   WBC 6.1 03/06/2020   HGB 11.3 03/06/2020   HCT 36.8 03/06/2020   MCV 82 03/06/2020   PLT 283 03/06/2020   No results found for: IRON, TIBC, FERRITIN  Attestation Statements:   Reviewed by clinician on day of visit: allergies, medications, problem list, medical history, surgical history, family history, social history, and previous encounter notes.  Time spent on visit including pre-visit chart review and post-visit care and charting was 45 minutes.    I, Burt Knack, am acting as transcriptionist for Quillian Quince, MD.  I have reviewed the above documentation for accuracy and completeness, and I agree with the above. -  Quillian Quince, MD

## 2020-03-23 DIAGNOSIS — M545 Low back pain, unspecified: Secondary | ICD-10-CM | POA: Diagnosis not present

## 2020-04-02 ENCOUNTER — Other Ambulatory Visit: Payer: Self-pay

## 2020-04-02 ENCOUNTER — Encounter (INDEPENDENT_AMBULATORY_CARE_PROVIDER_SITE_OTHER): Payer: Self-pay | Admitting: Physician Assistant

## 2020-04-02 ENCOUNTER — Ambulatory Visit (INDEPENDENT_AMBULATORY_CARE_PROVIDER_SITE_OTHER): Payer: BC Managed Care – PPO | Admitting: Physician Assistant

## 2020-04-02 VITALS — BP 105/67 | HR 88 | Temp 98.6°F | Ht 61.0 in | Wt 239.0 lb

## 2020-04-02 DIAGNOSIS — Z9189 Other specified personal risk factors, not elsewhere classified: Secondary | ICD-10-CM | POA: Diagnosis not present

## 2020-04-02 DIAGNOSIS — R7303 Prediabetes: Secondary | ICD-10-CM

## 2020-04-02 DIAGNOSIS — E559 Vitamin D deficiency, unspecified: Secondary | ICD-10-CM | POA: Diagnosis not present

## 2020-04-02 DIAGNOSIS — Z6841 Body Mass Index (BMI) 40.0 and over, adult: Secondary | ICD-10-CM

## 2020-04-03 NOTE — Progress Notes (Signed)
Chief Complaint:   OBESITY Peggy Garza is here to discuss her progress with her obesity treatment plan along with follow-up of her obesity related diagnoses. Peggy Garza is on keeping a food journal and adhering to recommended goals of 1400-1700 calories and 85+ grams of protein daily and states she is following her eating plan approximately 50% of the time. Peggy Garza states she is doing squats and cardio for 120 minutes 5 times per week.  Today's visit was #: 3 Starting weight: 239 lbs Starting date: 03/06/2020 Today's weight: 239 lbs Today's date: 04/02/2020 Total lbs lost to date: 0 Total lbs lost since last in-office visit: 0  Interim History: Peggy Garza reports that she is a very picky eater. She has not been journaling consistently. She states she is not always hungry early in the day, and sometimes she skips meals and then eats the wrong foods at night. She does not like fruits or veggies.  Subjective:   1. Pre-diabetes Peggy Garza is on metformin BID. Last A1c was 6.0. She denies polyphagia.  2. Vitamin D deficiency Peggy Garza is on Vit D weekly, and she is tolerating it well.  3. At risk for diabetes mellitus Peggy Garza is at higher than average risk for developing diabetes due to obesity.   Assessment/Plan:   1. Pre-diabetes Peggy Garza will continue her medications, and will continue to work on weight loss, exercise, and decreasing simple carbohydrates to help decrease the risk of diabetes.   2. Vitamin D deficiency Low Vitamin D level contributes to fatigue and are associated with obesity, breast, and colon cancer. Peggy Garza agreed to continue taking prescription Vitamin D 50,000 IU every week and will follow-up for routine testing of Vitamin D, at least 2-3 times per year to avoid over-replacement.  3. At risk for diabetes mellitus Peggy Garza was given approximately 15 minutes of diabetes education and counseling today. We discussed intensive lifestyle modifications today with an emphasis on weight loss  as well as increasing exercise and decreasing simple carbohydrates in her diet. We also reviewed medication options with an emphasis on risk versus benefit of those discussed.   Repetitive spaced learning was employed today to elicit superior memory formation and behavioral change.  4. Class 3 severe obesity with serious comorbidity and body mass index (BMI) of 45.0 to 49.9 in adult, unspecified obesity type (HCC) Peggy Garza is currently in the action stage of change. As such, her goal is to continue with weight loss efforts. She has agreed to the Category 3 Plan, modified.   Category 3 plan, and options given in detail.   Exercise goals: As is.  Behavioral modification strategies: increasing lean protein intake and no skipping meals.  Peggy Garza has agreed to follow-up with our clinic in 2 weeks. She was informed of the importance of frequent follow-up visits to maximize her success with intensive lifestyle modifications for her multiple health conditions.   Objective:   Blood pressure 105/67, pulse 88, temperature 98.6 F (37 C), height 5\' 1"  (1.549 m), weight 239 lb (108.4 kg), SpO2 100 %. Body mass index is 45.16 kg/m.  General: Cooperative, alert, well developed, in no acute distress. HEENT: Conjunctivae and lids unremarkable. Cardiovascular: Regular rhythm.  Lungs: Normal work of breathing. Neurologic: No focal deficits.   Lab Results  Component Value Date   CREATININE 0.79 03/06/2020   BUN 8 03/06/2020   NA 138 03/06/2020   K 4.3 03/06/2020   CL 104 03/06/2020   CO2 22 03/06/2020   Lab Results  Component Value Date  ALT 8 03/06/2020   AST 18 03/06/2020   ALKPHOS 49 03/06/2020   BILITOT 0.3 03/06/2020   Lab Results  Component Value Date   HGBA1C 6.0 (H) 03/06/2020   HGBA1C 5.7 (H) 03/28/2019   Lab Results  Component Value Date   INSULIN 28.7 (H) 03/06/2020   Lab Results  Component Value Date   TSH 0.575 03/06/2020   Lab Results  Component Value Date   CHOL  150 03/06/2020   HDL 43 03/06/2020   LDLCALC 88 03/06/2020   TRIG 103 03/06/2020   Lab Results  Component Value Date   WBC 6.1 03/06/2020   HGB 11.3 03/06/2020   HCT 36.8 03/06/2020   MCV 82 03/06/2020   PLT 283 03/06/2020   No results found for: IRON, TIBC, FERRITIN  Attestation Statements:   Reviewed by clinician on day of visit: allergies, medications, problem list, medical history, surgical history, family history, social history, and previous encounter notes.  Time spent on visit including pre-visit chart review and post-visit care and charting was 45 minutes.    Trude Mcburney, am acting as transcriptionist for [employer], PA-C.  I have reviewed the above documentation for accuracy and completeness, and I agree with the above. -  metacarpal

## 2020-04-04 ENCOUNTER — Encounter (INDEPENDENT_AMBULATORY_CARE_PROVIDER_SITE_OTHER): Payer: Self-pay

## 2020-04-07 DIAGNOSIS — M542 Cervicalgia: Secondary | ICD-10-CM | POA: Diagnosis not present

## 2020-04-16 ENCOUNTER — Ambulatory Visit (INDEPENDENT_AMBULATORY_CARE_PROVIDER_SITE_OTHER): Payer: BC Managed Care – PPO | Admitting: Family Medicine

## 2020-04-18 ENCOUNTER — Ambulatory Visit: Payer: Medicaid Other | Admitting: Neurology

## 2020-04-20 ENCOUNTER — Other Ambulatory Visit (INDEPENDENT_AMBULATORY_CARE_PROVIDER_SITE_OTHER): Payer: Self-pay | Admitting: Family Medicine

## 2020-04-20 DIAGNOSIS — E559 Vitamin D deficiency, unspecified: Secondary | ICD-10-CM

## 2020-04-23 ENCOUNTER — Encounter (INDEPENDENT_AMBULATORY_CARE_PROVIDER_SITE_OTHER): Payer: Self-pay

## 2020-04-23 NOTE — Telephone Encounter (Signed)
Last OV with Tracey 

## 2020-04-25 NOTE — Telephone Encounter (Signed)
Please review

## 2020-05-02 ENCOUNTER — Other Ambulatory Visit: Payer: Self-pay

## 2020-05-02 ENCOUNTER — Ambulatory Visit (INDEPENDENT_AMBULATORY_CARE_PROVIDER_SITE_OTHER): Payer: BC Managed Care – PPO | Admitting: Physician Assistant

## 2020-05-02 ENCOUNTER — Other Ambulatory Visit (INDEPENDENT_AMBULATORY_CARE_PROVIDER_SITE_OTHER): Payer: Self-pay | Admitting: Physician Assistant

## 2020-05-02 ENCOUNTER — Encounter (INDEPENDENT_AMBULATORY_CARE_PROVIDER_SITE_OTHER): Payer: Self-pay | Admitting: Physician Assistant

## 2020-05-02 VITALS — BP 107/67 | HR 94 | Temp 97.9°F | Ht 61.0 in | Wt 244.0 lb

## 2020-05-02 DIAGNOSIS — Z9189 Other specified personal risk factors, not elsewhere classified: Secondary | ICD-10-CM

## 2020-05-02 DIAGNOSIS — Z6841 Body Mass Index (BMI) 40.0 and over, adult: Secondary | ICD-10-CM

## 2020-05-02 DIAGNOSIS — E559 Vitamin D deficiency, unspecified: Secondary | ICD-10-CM

## 2020-05-02 DIAGNOSIS — R7303 Prediabetes: Secondary | ICD-10-CM | POA: Diagnosis not present

## 2020-05-02 MED ORDER — VITAMIN D (ERGOCALCIFEROL) 1.25 MG (50000 UNIT) PO CAPS: 50000.0000 [IU] | ORAL_CAPSULE | ORAL | 0 refills | Status: DC

## 2020-05-07 NOTE — Progress Notes (Signed)
Chief Complaint:   OBESITY Peggy Garza is here to discuss her progress with her obesity treatment plan along with follow-up of her obesity related diagnoses. Peggy Garza is on the Category 3 Plan and states she is following her eating plan approximately 0% of the time. Peggy Garza states she is doing 0 minutes 0 times per week.  Today's visit was #: 4 Starting weight: 239 lbs Starting date: 03/06/2020 Today's weight: 244 lbs Today's date: 05/02/2020 Total lbs lost to date: 0 Total lbs lost since last in-office visit: 0  Interim History: Peggy Garza has tried [place] bacon and she liked it. She is also eating yogurt. She skips breakfast and lunch, and she is drinking protein shakes. She does not like veggies.  Subjective:   1. Vitamin D deficiency As[place] is on Vit D. She missed the last 2 weeks due to running out.  2. Pre-diabetes Peggy Garza is on metformin, and she denies issues with medications. She denies polyphagia.  3. At risk for osteoporosis Peggy Garza is at higher risk of osteopenia and osteoporosis due to Vitamin D deficiency.   Assessment/Plan:   1. Vitamin D deficiency Low Vitamin D level contributes to fatigue and are associated with obesity, breast, and colon cancer. We will refill prescription Vitamin D for 1 month. Peggy Garza will follow-up for routine testing of Vitamin D, at least 2-3 times per year to avoid over-replacement.  - Vitamin D, Ergocalciferol, (DRISDOL) 1.25 MG (50000 UNIT) CAPS capsule; Take 1 capsule (50,000 Units total) by mouth every 7 (seven) days.  Dispense: 4 capsule; Refill: 0  2. Pre-diabetes Peggy Garza will continue her medications, and will continue to work on weight loss, exercise, and decreasing simple carbohydrates to help decrease the risk of diabetes.   3. At risk for osteoporosis Peggy Garza was given approximately 15 minutes of osteoporosis prevention counseling today. Peggy Garza is at risk for osteopenia and osteoporosis due to her Vitamin D deficiency. She was encouraged  to take her Vitamin D and follow her higher calcium diet and increase strengthening exercise to help strengthen her bones and decrease her risk of osteopenia and osteoporosis.  Repetitive spaced learning was employed today to elicit superior memory formation and behavioral change.  4. Class 3 severe obesity with serious comorbidity and body mass index (BMI) of 45.0 to 49.9 in adult, unspecified obesity type (HCC) Peggy Garza is currently in the action stage of change. As such, her goal is to continue with weight loss efforts. She has agreed to the Category 3 Plan.   Exercise goals: No exercise has been prescribed at this time.  Behavioral modification strategies: meal planning and cooking strategies and keeping healthy foods in the home.  Peggy Garza has agreed to follow-up with our clinic in 2 weeks. She was informed of the importance of frequent follow-up visits to maximize her success with intensive lifestyle modifications for her multiple health conditions.   Objective:   Blood pressure 107/67, pulse 94, temperature 97.9 F (36.6 C), temperature source Oral, height 5\' 1"  (1.549 m), weight 244 lb (110.7 kg), SpO2 96 %. Body mass index is 46.1 kg/m.  General: Cooperative, alert, well developed, in no acute distress. HEENT: Conjunctivae and lids unremarkable. Cardiovascular: Regular rhythm.  Lungs: Normal work of breathing. Neurologic: No focal deficits.   Lab Results  Component Value Date   CREATININE 0.79 03/06/2020   BUN 8 03/06/2020   NA 138 03/06/2020   K 4.3 03/06/2020   CL 104 03/06/2020   CO2 22 03/06/2020   Lab Results  Component Value Date  ALT 8 03/06/2020   AST 18 03/06/2020   ALKPHOS 49 03/06/2020   BILITOT 0.3 03/06/2020   Lab Results  Component Value Date   HGBA1C 6.0 (H) 03/06/2020   HGBA1C 5.7 (H) 03/28/2019   Lab Results  Component Value Date   INSULIN 28.7 (H) 03/06/2020   Lab Results  Component Value Date   TSH 0.575 03/06/2020   Lab Results   Component Value Date   CHOL 150 03/06/2020   HDL 43 03/06/2020   LDLCALC 88 03/06/2020   TRIG 103 03/06/2020   Lab Results  Component Value Date   WBC 6.1 03/06/2020   HGB 11.3 03/06/2020   HCT 36.8 03/06/2020   MCV 82 03/06/2020   PLT 283 03/06/2020   No results found for: IRON, TIBC, FERRITIN  Attestation Statements:   Reviewed by clinician on day of visit: allergies, medications, problem list, medical history, surgical history, family history, social history, and previous encounter notes.   Trude Mcburney, am acting as transcriptionist for [employer], PA-C.  I have reviewed the above documentation for accuracy and completeness, and I agree with the above. -  *Alois Cliche, PA-C

## 2020-05-09 ENCOUNTER — Ambulatory Visit: Payer: BC Managed Care – PPO | Admitting: Family Medicine

## 2020-05-09 ENCOUNTER — Encounter: Payer: Self-pay | Admitting: Family Medicine

## 2020-05-16 ENCOUNTER — Ambulatory Visit (INDEPENDENT_AMBULATORY_CARE_PROVIDER_SITE_OTHER): Payer: No Typology Code available for payment source | Admitting: Physician Assistant

## 2020-05-16 ENCOUNTER — Encounter (INDEPENDENT_AMBULATORY_CARE_PROVIDER_SITE_OTHER): Payer: Self-pay | Admitting: Physician Assistant

## 2020-05-16 ENCOUNTER — Other Ambulatory Visit: Payer: Self-pay

## 2020-05-16 VITALS — BP 103/64 | HR 67 | Temp 97.9°F | Ht 61.0 in | Wt 248.0 lb

## 2020-05-16 DIAGNOSIS — Z6841 Body Mass Index (BMI) 40.0 and over, adult: Secondary | ICD-10-CM | POA: Diagnosis not present

## 2020-05-16 DIAGNOSIS — R7303 Prediabetes: Secondary | ICD-10-CM | POA: Diagnosis not present

## 2020-05-23 NOTE — Progress Notes (Signed)
Chief Complaint:   OBESITY Peggy Garza is here to discuss her progress with her obesity treatment plan along with follow-up of her obesity related diagnoses. Peggy Garza is on the Category 3 Plan and states she is following her eating plan approximately 50% of the time. Peggy Garza states she is doing cardio for 120 minutes 4 times per week.  Today's visit was #: 5 Starting weight: 239 lbs Starting date: 03/06/2020 Today's weight: 248 lbs Today's date: 05/16/2020 Total lbs lost to date: 0 Total lbs lost since last in-office visit: 0  Interim History: Peggy Garza is eating more yogurt with ritz crackers, peanut butter, and milk. She skips lunch 3-4 times a week. She is eating protein for dinner but no vegetables.   Subjective:   1. Pre-diabetes Peggy Garza has a diagnosis of prediabetes based on her elevated HgA1c and was informed this puts her at greater risk of developing diabetes. She continues to work on diet and exercise to decrease her risk of diabetes. She denies nausea or hypoglycemia. Medication: Metformin, tolerating well.   Lab Results  Component Value Date   HGBA1C 6.0 (H) 03/06/2020   Lab Results  Component Value Date   INSULIN 28.7 (H) 03/06/2020   Assessment/Plan:   1. Pre-diabetes Peggy Garza will continue to work on weight loss, exercise, and decreasing simple carbohydrates to help decrease the risk of diabetes. Continue with medications and weight loss./  2. Obesity, current BMI 46.9 Peggy Garza is currently in the action stage of change. As such, her goal is to continue with weight loss efforts. She has agreed to the Category 3 Plan.   Exercise goals: As is.  Behavioral modification strategies: increasing lean protein intake and no skipping meals.  Peggy Garza has agreed to follow-up with our clinic in 3 weeks. She was informed of the importance of frequent follow-up visits to maximize her success with intensive lifestyle modifications for her multiple health conditions.   Objective:    Blood pressure 103/64, pulse 67, temperature 97.9 F (36.6 C), height 5\' 1"  (1.549 m), weight 248 lb (112.5 kg), SpO2 97 %. Body mass index is 46.86 kg/m.  General: Cooperative, alert, well developed, in no acute distress. HEENT: Conjunctivae and lids unremarkable. Cardiovascular: Regular rhythm.  Lungs: Normal work of breathing. Neurologic: No focal deficits.   Lab Results  Component Value Date   CREATININE 0.79 03/06/2020   BUN 8 03/06/2020   NA 138 03/06/2020   K 4.3 03/06/2020   CL 104 03/06/2020   CO2 22 03/06/2020   Lab Results  Component Value Date   ALT 8 03/06/2020   AST 18 03/06/2020   ALKPHOS 49 03/06/2020   BILITOT 0.3 03/06/2020   Lab Results  Component Value Date   HGBA1C 6.0 (H) 03/06/2020   HGBA1C 5.7 (H) 03/28/2019   Lab Results  Component Value Date   INSULIN 28.7 (H) 03/06/2020   Lab Results  Component Value Date   TSH 0.575 03/06/2020   Lab Results  Component Value Date   CHOL 150 03/06/2020   HDL 43 03/06/2020   LDLCALC 88 03/06/2020   TRIG 103 03/06/2020   Lab Results  Component Value Date   WBC 6.1 03/06/2020   HGB 11.3 03/06/2020   HCT 36.8 03/06/2020   MCV 82 03/06/2020   PLT 283 03/06/2020   Attestation Statements:   Reviewed by clinician on day of visit: allergies, medications, problem list, medical history, surgical history, family history, social history, and previous encounter notes.  Time spent on visit including pre-visit  chart review and post-visit care and charting was 31 minutes.   Carlos Levering Friedenbach, CMA, am acting as Energy manager for [employer], PA-C.  I have reviewed the above documentation for accuracy and completeness, and I agree with the above. Alois Cliche, PA-C

## 2020-05-30 ENCOUNTER — Other Ambulatory Visit: Payer: Self-pay

## 2020-05-30 ENCOUNTER — Ambulatory Visit (INDEPENDENT_AMBULATORY_CARE_PROVIDER_SITE_OTHER): Payer: BC Managed Care – PPO | Admitting: Family Medicine

## 2020-05-30 ENCOUNTER — Encounter: Payer: Self-pay | Admitting: Family Medicine

## 2020-05-30 VITALS — BP 124/74 | HR 90 | Wt 250.3 lb

## 2020-05-30 DIAGNOSIS — E282 Polycystic ovarian syndrome: Secondary | ICD-10-CM

## 2020-05-30 DIAGNOSIS — E6609 Other obesity due to excess calories: Secondary | ICD-10-CM

## 2020-05-30 MED ORDER — METFORMIN HCL ER 500 MG PO TB24: 500.0000 mg | ORAL_TABLET | Freq: Every day | ORAL | 3 refills | Status: DC

## 2020-05-30 NOTE — Progress Notes (Signed)
   Subjective:    Patient ID: Peggy Garza, female    DOB: 2000-11-05, 20 y.o.   MRN: [id number]  HPI  Patient seen for follow up of PCOS. Still having oligomenorrhea - last menses was in January. Has been taking her COCs. She is not tolerating metformin, even at 250mg  - gets nausea. Has been taking it with food. [facility]   Weight has been increasing over past year - 223# a year ago, now 250#.  A1c 6.0  Review of Systems     Objective:   Physical Exam Vitals reviewed. Exam conducted with a chaperone present.  Constitutional:      Appearance: Normal appearance.  Cardiovascular:     Rate and Rhythm: Normal rate.  Skin:    Capillary Refill: Capillary refill takes less than 2 seconds.  Neurological:     General: No focal deficit present.     Mental Status: She is alert.  Psychiatric:        Mood and Affect: Mood normal.        Behavior: Behavior normal.        Thought Content: Thought content normal.        Judgment: Judgment normal.       Assessment & Plan:  1. PCOS (polycystic ovarian syndrome) Will check [employer] for polycystic ovaries May need higher dose of COCs. Continue at [facility] office Will trial metformin XR500mg  daily with largest meal of day. Ok to increase to 2 tabs daily if tolerating. F/u in 3 months - US PELVIC COMPLETE WITH TRANSVAGINAL; Future  2. Obesity due to excess calories without serious comorbidity, unspecified classification

## 2020-06-07 ENCOUNTER — Ambulatory Visit (INDEPENDENT_AMBULATORY_CARE_PROVIDER_SITE_OTHER): Payer: No Typology Code available for payment source | Admitting: Physician Assistant

## 2020-06-07 ENCOUNTER — Other Ambulatory Visit: Payer: Self-pay

## 2020-06-07 VITALS — BP 112/77 | HR 75 | Temp 98.3°F | Ht 61.0 in | Wt 248.0 lb

## 2020-06-07 DIAGNOSIS — Z9189 Other specified personal risk factors, not elsewhere classified: Secondary | ICD-10-CM | POA: Diagnosis not present

## 2020-06-07 DIAGNOSIS — E559 Vitamin D deficiency, unspecified: Secondary | ICD-10-CM | POA: Diagnosis not present

## 2020-06-07 DIAGNOSIS — R7303 Prediabetes: Secondary | ICD-10-CM

## 2020-06-07 DIAGNOSIS — Z6841 Body Mass Index (BMI) 40.0 and over, adult: Secondary | ICD-10-CM | POA: Diagnosis not present

## 2020-06-07 MED ORDER — VITAMIN D (ERGOCALCIFEROL) 1.25 MG (50000 UNIT) PO CAPS: 50000.0000 [IU] | ORAL_CAPSULE | ORAL | 0 refills | Status: DC
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
## 2020-06-19 ENCOUNTER — Telehealth: Payer: Self-pay | Admitting: Neurology

## 2020-06-19 NOTE — Telephone Encounter (Signed)
She has canceled 4 new appointments in Neurology. No show and Canceled within 24 hours. If she calls back please discuss with Angie whether she can be scheduled d.

## 2020-06-20 ENCOUNTER — Ambulatory Visit: Payer: BC Managed Care – PPO | Admitting: Neurology

## 2020-06-21 ENCOUNTER — Inpatient Hospital Stay: Admission: RE | Admit: 2020-06-21 | Payer: BC Managed Care – PPO | Source: Ambulatory Visit

## 2020-06-26 ENCOUNTER — Ambulatory Visit (INDEPENDENT_AMBULATORY_CARE_PROVIDER_SITE_OTHER): Payer: No Typology Code available for payment source | Admitting: Physician Assistant

## 2020-08-07 ENCOUNTER — Telehealth: Payer: Self-pay | Admitting: Family Medicine

## 2020-08-14 ENCOUNTER — Other Ambulatory Visit: Payer: Self-pay

## 2020-08-14 ENCOUNTER — Ambulatory Visit (HOSPITAL_COMMUNITY)
Admission: RE | Admit: 2020-08-14 | Discharge: 2020-08-14 | Disposition: A | Discharge status: Home / Self Care | Payer: BC Managed Care – PPO | Source: Ambulatory Visit | Attending: Family Medicine | Admitting: Family Medicine

## 2020-08-14 DIAGNOSIS — N888 Other specified noninflammatory disorders of cervix uteri: Secondary | ICD-10-CM | POA: Diagnosis not present

## 2020-08-14 DIAGNOSIS — E282 Polycystic ovarian syndrome: Secondary | ICD-10-CM | POA: Insufficient documentation

## 2020-08-23 ENCOUNTER — Other Ambulatory Visit: Payer: Self-pay

## 2020-08-23 ENCOUNTER — Encounter (INDEPENDENT_AMBULATORY_CARE_PROVIDER_SITE_OTHER): Payer: Self-pay | Admitting: Adult Health

## 2020-08-23 ENCOUNTER — Ambulatory Visit (INDEPENDENT_AMBULATORY_CARE_PROVIDER_SITE_OTHER): Payer: BC Managed Care – PPO | Admitting: Adult Health

## 2020-08-23 ENCOUNTER — Other Ambulatory Visit (INDEPENDENT_AMBULATORY_CARE_PROVIDER_SITE_OTHER): Payer: Self-pay | Admitting: Adult Health

## 2020-08-23 VITALS — BP 105/69 | HR 76 | Temp 98.2°F | Ht 61.0 in | Wt 249.0 lb

## 2020-08-23 DIAGNOSIS — Z6841 Body Mass Index (BMI) 40.0 and over, adult: Secondary | ICD-10-CM

## 2020-08-23 DIAGNOSIS — Z9189 Other specified personal risk factors, not elsewhere classified: Secondary | ICD-10-CM | POA: Diagnosis not present

## 2020-08-23 DIAGNOSIS — E559 Vitamin D deficiency, unspecified: Secondary | ICD-10-CM

## 2020-08-23 DIAGNOSIS — E282 Polycystic ovarian syndrome: Secondary | ICD-10-CM

## 2020-08-23 DIAGNOSIS — R7303 Prediabetes: Secondary | ICD-10-CM

## 2020-08-23 DIAGNOSIS — R739 Hyperglycemia, unspecified: Secondary | ICD-10-CM

## 2020-08-23 DIAGNOSIS — E66813 Obesity, class 3: Secondary | ICD-10-CM

## 2020-08-23 MED ORDER — VITAMIN D (ERGOCALCIFEROL) 1.25 MG (50000 UNIT) PO CAPS: 50000.0000 [IU] | ORAL_CAPSULE | ORAL | 0 refills | Status: DC

## 2020-08-23 NOTE — Telephone Encounter (Signed)
Pt last seen by Katy Danford, FNP.  

## 2020-08-24 LAB — COMPREHENSIVE METABOLIC PANEL
ALT: 17 IU/L (ref 0–32)
AST: 18 IU/L (ref 0–40)
Albumin/Globulin Ratio: 1.1 — ABNORMAL LOW (ref 1.2–2.2)
Albumin: 4.1 g/dL (ref 3.9–5.0)
Alkaline Phosphatase: 58 IU/L (ref 44–121)
BUN/Creatinine Ratio: 13 (ref 9–23)
BUN: 10 mg/dL (ref 6–20)
Bilirubin Total: <0.2 mg/dL (ref 0.0–1.2)
CO2: 21 mmol/L (ref 20–29)
Calcium: 9.5 mg/dL (ref 8.7–10.2)
Chloride: 101 mmol/L (ref 96–106)
Creatinine, Ser: 0.77 mg/dL (ref 0.57–1.00)
Globulin, Total: 3.6 g/dL (ref 1.5–4.5)
Glucose: 90 mg/dL (ref 65–99)
Potassium: 4.5 mmol/L (ref 3.5–5.2)
Sodium: 140 mmol/L (ref 134–144)
Total Protein: 7.7 g/dL (ref 6.0–8.5)
eGFR: 112 mL/min/{1.73_m2} (ref >59)

## 2020-08-24 LAB — HEMOGLOBIN A1C
Est. average glucose Bld gHb Est-mCnc: 123 mg/dL
Hgb A1c MFr Bld: 5.9 % — ABNORMAL HIGH (ref 4.8–5.6)

## 2020-08-24 LAB — VITAMIN D 25 HYDROXY (VIT D DEFICIENCY, FRACTURES): Vit D, 25-Hydroxy: 29.6 ng/mL — ABNORMAL LOW (ref 30.0–100.0)

## 2020-08-24 LAB — INSULIN, RANDOM: INSULIN: 58.5 u[IU]/mL — ABNORMAL HIGH (ref 2.6–24.9)

## 2020-08-30 NOTE — Progress Notes (Signed)
Chief Complaint:   OBESITY Peggy Garza is here to discuss her progress with her obesity treatment plan along with follow-up of her obesity related diagnoses. [name] is on the Category 3 Plan and states she is following her eating plan approximately 80% of the time. [name] states she is going to the gym 120 minutes 5 times per week.  Today's visit was #: 7 Starting weight: 239 lbs Starting date: 03/06/2020 Today's weight: 249 lbs Today's date: 08/23/2020 Total lbs lost to date: 0 Total lbs lost since last in-office visit: 0  Interim History: Peggy Garza has been challenged to find protein, fruits, and vegetables that she like to eat on plan.  The only fruit she likes is cantaloupe. The only types of proteins she will consume are chicken and shrimp.  he has yet to find any vegetables that agree with her palate. A typical day of consumption is: Frosted Flakes,- breakfast Yogurt, peanut butter and jelly with 45 calorie bread- lunch Either shrimp or chicken often with rice- dinner  Subjective:   1. Vitamin D deficiency 03/06/20 Vit D level was 31.6 - below goal of 50. She is currently taking prescription vitamin D 50,000 IU each week. She denies nausea, vomiting or muscle weakness.   2. PCOS (polycystic ovarian syndrome) Peggy Garza has tried both formulations of Metformin- plain and XR- and experienced diarrhea with both.  3. Hyperglycemia She is unable to tolerate any formulation of Metformin due to diarrhea.  4. At risk for deficient intake of food Peggy Garza is at risk for deficient intake of food due to food aversions.  Assessment/Plan:   1. Vitamin D deficiency Low Vitamin D level contributes to fatigue and are associated with obesity, breast, and colon cancer. She agrees to continue to take prescription Vitamin D @50 ,000 IU every week and will follow-up for routine testing of Vitamin D, at least 2-3 times per year to avoid over-replacement. Check labs today.  Refill- Vitamin D,  Ergocalciferol, (DRISDOL) 1.25 MG (50000 UNIT) CAPS capsule; Take 1 capsule (50,000 Units total) by mouth every 7 (seven) days.  Dispense: 4 capsule; Refill: 0  - VITAMIN D 25 Hydroxy (Vit-D Deficiency, Fractures)  2. PCOS (polycystic ovarian syndrome) Follow up with OB/Gyn. Intensive lifestyle modifications are first line treatment for this issue. We discussed several lifestyle modifications today and she will continue to work on diet, exercise and weight loss efforts. Orders and follow up as documented in patient record.  Counseling PCOS is a leading cause of menstrual irregularities and infertility. It is also associated with obesity, hirsutism (excessive hair growth on the face, chest, or back), and cardiovascular risk factors such as high cholesterol and insulin resistance. Insulin resistance appears to play a central role.  Women with PCOS have been shown to have impaired appetite-regulating hormones. Metformin is one medication that can improve metabolic parameters.  Women with polycystic ovary syndrome (PCOS) have an increased risk for cardiovascular disease (CVD) - European Journal of Preventive Cardiology.   3. Hyperglycemia Fasting labs will be obtained and results with be discussed with [name] in 2 weeks at her follow up visit. In the meanwhile [name] was started on a lower simple carbohydrate diet and will work on weight loss efforts.  Check labs today.  - Comprehensive metabolic panel - Hemoglobin A1c - Insulin, random  4. At risk for deficient intake of food Peggy Garza was given approximately 15 minutes of deficit intake of food prevention counseling today. Peggy Garza is at risk for eating too few calories based on current  food recall. She was encouraged to focus on meeting caloric and protein goals according to her recommended meal plan.    5. Obesity, current BMI 47.1  [name] is currently in the action stage of change. As such, her goal is to continue with weight loss efforts. She  has agreed to the Category 3 Plan.   Exercise goals:  As is  Behavioral modification strategies: increasing lean protein intake, decreasing simple carbohydrates, no skipping meals, meal planning and cooking strategies, keeping healthy foods in the home, better snacking choices, and planning for success.  Peggy Garza has agreed to follow-up with our clinic in 3 weeks. She was informed of the importance of frequent follow-up visits to maximize her success with intensive lifestyle modifications for her multiple health conditions.   Peggy Garza was informed we would discuss her lab results at her next visit unless there is a critical issue that needs to be addressed sooner. Peggy Garza agreed to keep her next visit at the agreed upon time to discuss these results.  Objective:   Blood pressure 105/69, pulse 76, temperature 98.2 F (36.8 C), height 5\' 1"  (1.549 m), weight 249 lb (112.9 kg), SpO2 98 %. Body mass index is 47.05 kg/m.  General: Cooperative, alert, well developed, in no acute distress. HEENT: Conjunctivae and lids unremarkable. Cardiovascular: Regular rhythm.  Lungs: Normal work of breathing. Neurologic: No focal deficits.   Lab Results  Component Value Date   CREATININE 0.77 08/23/2020   BUN 10 08/23/2020   NA 140 08/23/2020   K 4.5 08/23/2020   CL 101 08/23/2020   CO2 21 08/23/2020   Lab Results  Component Value Date   ALT 17 08/23/2020   AST 18 08/23/2020   ALKPHOS 58 08/23/2020   BILITOT <0.2 08/23/2020   Lab Results  Component Value Date   HGBA1C 5.9 (H) 08/23/2020   HGBA1C 6.0 (H) 03/06/2020   HGBA1C 5.7 (H) 03/28/2019   Lab Results  Component Value Date   INSULIN 58.5 (H) 08/23/2020   INSULIN 28.7 (H) 03/06/2020   Lab Results  Component Value Date   TSH 0.575 03/06/2020   Lab Results  Component Value Date   CHOL 150 03/06/2020   HDL 43 03/06/2020   LDLCALC 88 03/06/2020   TRIG 103 03/06/2020   Lab Results  Component Value Date   VD25OH 29.6 (L) 08/23/2020    VD25OH 31.6 03/06/2020   Lab Results  Component Value Date   WBC 6.1 03/06/2020   HGB 11.3 03/06/2020   HCT 36.8 03/06/2020   MCV 82 03/06/2020   PLT 283 03/06/2020    Attestation Statements:   Reviewed by clinician on day of visit: allergies, medications, problem list, medical history, surgical history, family history, social history, and previous encounter notes.  03/08/2020, CMA, am acting as transcriptionist for Edmund Hilda, NP.  I have reviewed the above documentation for accuracy and completeness, and I agree with the above. -  Joscelyne Renville d. George Alcantar, NP-C

## 2020-09-02 DIAGNOSIS — R739 Hyperglycemia, unspecified: Secondary | ICD-10-CM | POA: Insufficient documentation

## 2020-09-02 DIAGNOSIS — Z6841 Body Mass Index (BMI) 40.0 and over, adult: Secondary | ICD-10-CM | POA: Insufficient documentation

## 2020-09-13 ENCOUNTER — Encounter (INDEPENDENT_AMBULATORY_CARE_PROVIDER_SITE_OTHER): Payer: Self-pay | Admitting: Adult Health

## 2020-09-13 ENCOUNTER — Ambulatory Visit (INDEPENDENT_AMBULATORY_CARE_PROVIDER_SITE_OTHER): Payer: BC Managed Care – PPO | Admitting: Adult Health

## 2020-09-13 ENCOUNTER — Other Ambulatory Visit: Payer: Self-pay

## 2020-09-13 ENCOUNTER — Other Ambulatory Visit (INDEPENDENT_AMBULATORY_CARE_PROVIDER_SITE_OTHER): Payer: Self-pay | Admitting: Adult Health

## 2020-09-13 VITALS — BP 104/69 | HR 84 | Temp 97.5°F | Ht 61.0 in | Wt 247.0 lb

## 2020-09-13 DIAGNOSIS — Z9189 Other specified personal risk factors, not elsewhere classified: Secondary | ICD-10-CM | POA: Diagnosis not present

## 2020-09-13 DIAGNOSIS — E559 Vitamin D deficiency, unspecified: Secondary | ICD-10-CM | POA: Diagnosis not present

## 2020-09-13 DIAGNOSIS — Z6841 Body Mass Index (BMI) 40.0 and over, adult: Secondary | ICD-10-CM | POA: Diagnosis not present

## 2020-09-13 DIAGNOSIS — R7303 Prediabetes: Secondary | ICD-10-CM

## 2020-09-13 MED ORDER — PEN NEEDLES 32G X 4 MM MISC: 1.0000 | Freq: Two times a day (BID) | 0 refills | Status: DC

## 2020-09-13 MED ORDER — VITAMIN D (ERGOCALCIFEROL) 1.25 MG (50000 UNIT) PO CAPS: 50000.0000 [IU] | ORAL_CAPSULE | ORAL | 0 refills | Status: DC

## 2020-09-13 MED ORDER — LIRAGLUTIDE 18 MG/3ML ~~LOC~~ SOPN
0.6000 mg | PEN_INJECTOR | Freq: Every day | SUBCUTANEOUS | 0 refills | Status: DC
Start: 2020-09-13 — End: 2020-10-08

## 2020-09-18 NOTE — Progress Notes (Signed)
Chief Complaint:   OBESITY Peggy Garza is here to discuss her progress with her obesity treatment plan along with follow-up of her obesity related diagnoses. Peggy Garza is on the Category 3 Plan and states she is following her eating plan approximately 80% of the time. Peggy Garza states she is doing cardio and weights for 60-120 minutes 5 times per week.  Today's visit was #: 8 Starting weight: 249 lbs Starting date: 03/06/2020 Today's weight: 247 lbs Today's date: 09/13/2020 Total lbs lost to date: 2 Total lbs lost since last in-office visit: 2  Interim History: Peggy Garza is frustrated with slow weight loss despite following Category 3 >80% and exercising on a regular basis.  Of note-previously intolerant to any formulation of metformin.   Subjective:   1. Vitamin D deficiency Peggy Garza's Vit D is worsening. Last Vit D level was 29.6, and is well below goal of 50. She is on Ergocalciferol. I discussed labs with the patient today.  2. Pre-diabetes Peggy Garza's level are worsening. Last insulin level was 58.5 and A1c was 5.9 on 08/23/2020, both above goal. Her maternal grandmother, mother, paternal grandfather, all were diabetic. She has profound nausea and diarrhea when on metformin (plain and XR).  She denies a family history of MTC and she denies a personal history of pancreatitis. I discussed labs with the patient today.  3. At risk for constipation Peggy Garza is at increased risk for constipation due to GLP-1 initiated for pre-diabetes.  Assessment/Plan:   1. Vitamin D deficiency Low Vitamin D level contributes to fatigue and are associated with obesity, breast, and colon cancer. Peggy Garza agreed to continue prescription Vitamin D 50,000 IU every week and we will refill for 1 month. She will follow-up for routine testing of Vitamin D, at least 2-3 times per year to avoid over-replacement.  - Vitamin D, Ergocalciferol, (DRISDOL) 1.25 MG (50000 UNIT) CAPS capsule; Take 1 capsule (50,000 Units total) by  mouth every 7 (seven) days.  Dispense: 4 capsule; Refill: 0  2. Pre-diabetes Peggy Garza agreed to start Victoza 0.6 mg once daily with no refills, and pen needles #100 with no refills. Slow titration on GLP-1 due to previous GI upset on metformin. She will continue to work on weight loss, exercise, and decreasing simple carbohydrates to help decrease the risk of diabetes.   - liraglutide (VICTOZA) 18 MG/3ML SOPN; Inject 0.6 mg into the skin daily.  Dispense: 3 mL; Refill: 0 - Insulin Pen Needle (PEN NEEDLES) 32G X 4 MM MISC; 1 Device by Does not apply route 2 (two) times daily.  Dispense: 100 each; Refill: 0  3. At risk for constipation Peggy Garza was informed that a decrease in bowel movement frequency is normal while losing weight, but stools should not be hard or painful. Orders and follow up as documented in patient record.   Counseling Getting to Good Bowel Health: Your goal is to have one soft bowel movement each day. Drink at least 8 glasses of water each day. Eat plenty of fiber (goal is over 25 grams each day). It is best to get most of your fiber from dietary sources which includes leafy green vegetables, fresh fruit, and whole grains. You may need to add fiber with the help of OTC fiber supplements. These include Metamucil, Citrucel, and Flaxseed. If you are still having trouble, try adding Miralax or Magnesium Citrate. If all of these changes do not work, Dietitian.  4. Obesity, current BMI 46.7 Peggy Garza is currently in the action stage of change. As such,  her goal is to continue with weight loss efforts. She has agreed to the Category 3 Plan.   Exercise goals: As is.  Behavioral modification strategies: increasing lean protein intake, decreasing simple carbohydrates, meal planning and cooking strategies, keeping healthy foods in the home, and planning for success.  Peggy Garza has agreed to follow-up with our clinic in 2 to 3 weeks. She was informed of the importance of frequent  follow-up visits to maximize her success with intensive lifestyle modifications for her multiple health conditions.   Objective:   Blood pressure 104/69, pulse 84, temperature (!) 97.5 F (36.4 C), height 5\' 1"  (1.549 m), weight 247 lb (112 kg), SpO2 97 %. Body mass index is 46.67 kg/m.  General: Cooperative, alert, well developed, in no acute distress. HEENT: Conjunctivae and lids unremarkable. Cardiovascular: Regular rhythm.  Lungs: Normal work of breathing. Neurologic: No focal deficits.   Lab Results  Component Value Date   CREATININE 0.77 08/23/2020   BUN 10 08/23/2020   NA 140 08/23/2020   K 4.5 08/23/2020   CL 101 08/23/2020   CO2 21 08/23/2020   Lab Results  Component Value Date   ALT 17 08/23/2020   AST 18 08/23/2020   ALKPHOS 58 08/23/2020   BILITOT <0.2 08/23/2020   Lab Results  Component Value Date   HGBA1C 5.9 (H) 08/23/2020   HGBA1C 6.0 (H) 03/06/2020   HGBA1C 5.7 (H) 03/28/2019   Lab Results  Component Value Date   INSULIN 58.5 (H) 08/23/2020   INSULIN 28.7 (H) 03/06/2020   Lab Results  Component Value Date   TSH 0.575 03/06/2020   Lab Results  Component Value Date   CHOL 150 03/06/2020   HDL 43 03/06/2020   LDLCALC 88 03/06/2020   TRIG 103 03/06/2020   Lab Results  Component Value Date   VD25OH 29.6 (L) 08/23/2020   VD25OH 31.6 03/06/2020   Lab Results  Component Value Date   WBC 6.1 03/06/2020   HGB 11.3 03/06/2020   HCT 36.8 03/06/2020   MCV 82 03/06/2020   PLT 283 03/06/2020   No results found for: IRON, TIBC, FERRITIN  Attestation Statements:   Reviewed by clinician on day of visit: allergies, medications, problem list, medical history, surgical history, family history, social history, and previous encounter notes.   03/08/2020, am acting as transcriptionist for Trude Mcburney, NP.  I have reviewed the above documentation for accuracy and completeness, and I agree with the above. -  Shayon Trompeter d. Treyden Hakim, NP-C

## 2020-10-05 DIAGNOSIS — N6002 Solitary cyst of left breast: Secondary | ICD-10-CM | POA: Diagnosis not present

## 2020-10-08 ENCOUNTER — Other Ambulatory Visit: Payer: Self-pay

## 2020-10-08 ENCOUNTER — Other Ambulatory Visit (INDEPENDENT_AMBULATORY_CARE_PROVIDER_SITE_OTHER): Payer: Self-pay | Admitting: Adult Health

## 2020-10-08 ENCOUNTER — Encounter (INDEPENDENT_AMBULATORY_CARE_PROVIDER_SITE_OTHER): Payer: Self-pay | Admitting: Adult Health

## 2020-10-08 ENCOUNTER — Ambulatory Visit (INDEPENDENT_AMBULATORY_CARE_PROVIDER_SITE_OTHER): Payer: BC Managed Care – PPO | Admitting: Adult Health

## 2020-10-08 VITALS — BP 105/68 | HR 91 | Temp 98.3°F | Ht 61.0 in | Wt 242.0 lb

## 2020-10-08 DIAGNOSIS — Z6841 Body Mass Index (BMI) 40.0 and over, adult: Secondary | ICD-10-CM | POA: Diagnosis not present

## 2020-10-08 DIAGNOSIS — E559 Vitamin D deficiency, unspecified: Secondary | ICD-10-CM

## 2020-10-08 DIAGNOSIS — Z9189 Other specified personal risk factors, not elsewhere classified: Secondary | ICD-10-CM

## 2020-10-08 DIAGNOSIS — R7303 Prediabetes: Secondary | ICD-10-CM

## 2020-10-08 MED ORDER — VITAMIN D (ERGOCALCIFEROL) 1.25 MG (50000 UNIT) PO CAPS: 50000.0000 [IU] | ORAL_CAPSULE | ORAL | 0 refills | Status: DC

## 2020-10-08 MED ORDER — LIRAGLUTIDE 18 MG/3ML ~~LOC~~ SOPN: 1.2000 mg | PEN_INJECTOR | Freq: Every day | SUBCUTANEOUS | 0 refills | Status: DC

## 2020-10-08 NOTE — Progress Notes (Signed)
Chief Complaint:   OBESITY Peggy Garza is here to discuss her progress with her obesity treatment plan along with follow-up of her obesity related diagnoses. Peggy Garza is on the Category 3 Plan and states she is following her eating plan approximately 50% of the time. [name] states she is doing cardio and weights for 2 hours 3-4 times per week.  Today's visit was #: 9 Starting weight: 249 lbs Starting date: 03/06/2020 Today's weight: 242 lbs Today's date: 2/29/202 Total lbs lost to date: 7 lbs Total lbs lost since last in-office visit: 5 lbs  Interim History: Peggy Garza was started on Victoza 0.6 mg daily on 09/13/2020 for prediabetes (previously intolerant to metformin). She denies mass in neck, dysphagia, dyspepsia, persistent hoarseness, nausea, or constipation. 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  Subjective:   1. Vitamin D deficiency Vitamin D level was 29.6 on 08/23/2020 - well below goal of 50.  She is currently taking prescription vitamin D 50,000 IU each week. She denies nausea, vomiting or muscle weakness.  Lab Results  Component Value Date   VD25OH 29.6 (L) 08/23/2020   VD25OH 31.6 03/06/2020   2. Pre-diabetes On 08/23/2020, A1c was 5.9.  She was started on Victoza 0.6 mg daily on 09/13/2020.  She denies mass in neck, dysphagia, dyspepsia, persistent hoarseness, nausea, or constipation.  Lab Results  Component Value Date   HGBA1C 5.9 (H) 08/23/2020   Lab Results  Component Value Date   INSULIN 58.5 (H) 08/23/2020   INSULIN 28.7 (H) 03/06/2020   3. At risk for constipation Peggy Garza is at increased risk for constipation due to increasing her GLP-1 for prediabetes.   Assessment/Plan:   1. Vitamin D deficiency Low Vitamin D level contributes to fatigue and are associated with obesity, breast, and colon cancer. She agrees to continue to take prescription Vitamin D @50 ,000 IU every week.  We will check labs in 3 months.    -  Vitamin D, Ergocalciferol, (DRISDOL) 1.25 MG (50000 UNIT) CAPS capsule; Take 1 capsule (50,000 Units total) by mouth every 7 (seven) days.  Dispense: 4 capsule; Refill: 0  2. Pre-diabetes Peggy Garza will continue to work on weight loss, exercise, and decreasing simple carbohydrates to help decrease the risk of diabetes.  Will increase Victoza to 1.2 mg daily, as per below, and check labs in 3 months.   - Increase and refill liraglutide (VICTOZA) 18 MG/3ML SOPN; Inject 1.2 mg into the skin daily.  Dispense: 6 mL; Refill: 0  3. At risk for constipation Peggy Garza was given approximately 15 minutes of counseling today regarding prevention of constipation. She was encouraged to increase water and fiber intake.    4. Obesity, current BMI 45.8  Peggy Garza is currently in the action stage of change. As such, her goal is to continue with weight loss efforts. She has agreed to the Category 3 Plan.   Exercise goals:  As is.  Behavioral modification strategies: increasing lean protein intake, decreasing simple carbohydrates, meal planning and cooking strategies, keeping healthy foods in the home, and planning for success.  Consume at least a high protein shake for dinner instead of skipping a meal altogether.  Peggy Garza has agreed to follow-up with our clinic in 3 weeks. She was informed of the importance of frequent follow-up visits to maximize her success with intensive lifestyle modifications for her multiple health conditions.   Objective:   Blood pressure 105/68, pulse 91, temperature 98.3 F (36.8 C), height 5\' 1"  (  1.549 m), weight 242 lb (109.8 kg), SpO2 98 %. Body mass index is 45.73 kg/m.  General: Cooperative, alert, well developed, in no acute distress. HEENT: Conjunctivae and lids unremarkable. Cardiovascular: Regular rhythm.  Lungs: Normal work of breathing. Neurologic: No focal deficits.   Lab Results  Component Value Date   CREATININE 0.77 08/23/2020   BUN 10 08/23/2020   NA 140  08/23/2020   K 4.5 08/23/2020   CL 101 08/23/2020   CO2 21 08/23/2020   Lab Results  Component Value Date   ALT 17 08/23/2020   AST 18 08/23/2020   ALKPHOS 58 08/23/2020   BILITOT <0.2 08/23/2020   Lab Results  Component Value Date   HGBA1C 5.9 (H) 08/23/2020   HGBA1C 6.0 (H) 03/06/2020   HGBA1C 5.7 (H) 03/28/2019   Lab Results  Component Value Date   INSULIN 58.5 (H) 08/23/2020   INSULIN 28.7 (H) 03/06/2020   Lab Results  Component Value Date   TSH 0.575 03/06/2020   Lab Results  Component Value Date   CHOL 150 03/06/2020   HDL 43 03/06/2020   LDLCALC 88 03/06/2020   TRIG 103 03/06/2020   Lab Results  Component Value Date   VD25OH 29.6 (L) 08/23/2020   VD25OH 31.6 03/06/2020   Lab Results  Component Value Date   WBC 6.1 03/06/2020   HGB 11.3 03/06/2020   HCT 36.8 03/06/2020   MCV 82 03/06/2020   PLT 283 03/06/2020   Attestation Statements:   Reviewed by clinician on day of visit: allergies, medications, problem list, medical history, surgical history, family history, social history, and previous encounter notes.  I, Insurance claims handler, CMA, am acting as Energy manager for William Hamburger, NP.  I have reviewed the above documentation for accuracy and completeness, and I agree with the above. -  Mabrey Howland d. Elyse Prevo, NP-C

## 2020-10-20 DIAGNOSIS — R519 Headache, unspecified: Secondary | ICD-10-CM | POA: Diagnosis not present

## 2020-10-20 DIAGNOSIS — A609 Anogenital herpesviral infection, unspecified: Secondary | ICD-10-CM | POA: Diagnosis not present

## 2020-10-20 DIAGNOSIS — Z711 Person with feared health complaint in whom no diagnosis is made: Secondary | ICD-10-CM | POA: Diagnosis not present

## 2020-10-23 ENCOUNTER — Other Ambulatory Visit: Payer: Self-pay

## 2020-10-23 ENCOUNTER — Encounter: Payer: Self-pay | Admitting: Neurology

## 2020-10-23 ENCOUNTER — Ambulatory Visit (INDEPENDENT_AMBULATORY_CARE_PROVIDER_SITE_OTHER): Payer: BC Managed Care – PPO | Admitting: Neurology

## 2020-10-23 VITALS — BP 114/71 | HR 86 | Ht 62.0 in | Wt 241.8 lb

## 2020-10-23 DIAGNOSIS — Q064 Hydromyelia: Secondary | ICD-10-CM

## 2020-10-23 DIAGNOSIS — R519 Headache, unspecified: Secondary | ICD-10-CM | POA: Diagnosis not present

## 2020-10-23 DIAGNOSIS — H538 Other visual disturbances: Secondary | ICD-10-CM | POA: Diagnosis not present

## 2020-10-23 DIAGNOSIS — G43709 Chronic migraine without aura, not intractable, without status migrainosus: Secondary | ICD-10-CM

## 2020-10-23 DIAGNOSIS — R5383 Other fatigue: Secondary | ICD-10-CM | POA: Diagnosis not present

## 2020-10-23 DIAGNOSIS — R0681 Apnea, not elsewhere classified: Secondary | ICD-10-CM

## 2020-10-23 DIAGNOSIS — R0683 Snoring: Secondary | ICD-10-CM

## 2020-10-23 DIAGNOSIS — R51 Headache with orthostatic component, not elsewhere classified: Secondary | ICD-10-CM

## 2020-10-23 MED ORDER — RIZATRIPTAN BENZOATE 10 MG PO TBDP: 10.0000 mg | ORAL_TABLET | ORAL | 11 refills | Status: DC | PRN

## 2020-10-23 MED ORDER — ONDANSETRON 4 MG PO TBDP: 4.0000 mg | ORAL_TABLET | Freq: Three times a day (TID) | ORAL | 3 refills | Status: DC | PRN

## 2020-10-23 MED ORDER — TOPIRAMATE 50 MG PO TABS: 50.0000 mg | ORAL_TABLET | Freq: Every day | ORAL | 3 refills | Status: DC

## 2020-10-23 NOTE — Patient Instructions (Addendum)
Acute/emergent: Rizatriptan. Please take one tablet at the onset of your headache. If it does not improve the symptoms please take one additional tablet. Do not take more then 2 tablets in 24hrs. Do not take use more then 2 to 3 times in a week. If fails try Ubrelvy. Ondansetron for nausea.  Preventative: Topiramate every night. If filed would try Ajovy, Emgality or Qulipta  Sleep study: Send sleep team - will call  MRI of the brain  Check thyroid  Topiramate Tablets What is this medication? TOPIRAMATE (toe PYRE a mate) prevents and controls seizures in people with epilepsy. It may also be used to prevent migraine headaches. It works by calming overactive nerves in your body. This medicine may be used for other purposes; ask your health care provider or pharmacist if you have questions. COMMON BRAND NAME(S): Topamax, Topiragen What should I tell my care team before I take this medication? They need to know if you have any of these conditions: Bleeding disorder Kidney disease Lung disease Suicidal thoughts, plans or attempt An unusual or allergic reaction to topiramate, other medications, foods, dyes, or preservatives Pregnant or trying to get pregnant Breast-feeding How should I use this medication? Take this medication by mouth with water. Take it as directed on the prescription label at the same time every day. Do not cut, crush or chew this medicine. Swallow the tablets whole. You can take it with or without food. If it upsets your stomach, take it with food. Keep taking it unless your care team tells you to stop. A special MedGuide will be given to you by the pharmacist with each prescription and refill. Be sure to read this information carefully each time. Talk to your care team about the use of this medication in children. While it may be prescribed for children as young as 2 years for selected conditions, precautions do apply. Overdosage: If you think you have taken too much of  this medicine contact a poison control center or emergency room at once. NOTE: This medicine is only for you. Do not share this medicine with others. What if I miss a dose? If you miss a dose, take it as soon as you can unless it is within 6 hours of the next dose. If it is within 6 hours of the next dose, skip the missed dose. Take the next dose at the normal time. Do not take double or extra doses. What may interact with this medication? Acetazolamide Alcohol Antihistamines for allergy, cough, and cold Aspirin and aspirin-like medications Atropine Birth control pills Certain medications for anxiety or sleep Certain medications for bladder problems like oxybutynin, tolterodine Certain medications for depression like amitriptyline, fluoxetine, sertraline Certain medications for Parkinson's disease like benztropine, trihexyphenidyl Certain medications for seizures like carbamazepine, lamotrigine, phenobarbital, phenytoin, primidone, valproic acid, zonisamide Certain medications for stomach problems like dicyclomine, hyoscyamine Certain medications for travel sickness like scopolamine Certain medications that treat or prevent blood clots like warfarin, enoxaparin, dalteparin, apixaban, dabigatran, and rivaroxaban Digoxin Diltiazem General anesthetics like halothane, isoflurane, methoxyflurane, propofol Glyburide Hydrochlorothiazide Ipratropium Lithium Medications that relax muscles Metformin Narcotic medications for pain NSAIDs, medications for pain and inflammation, like ibuprofen or naproxen Phenothiazines like chlorpromazine, mesoridazine, prochlorperazine, thioridazine Pioglitazone This list may not describe all possible interactions. Give your health care provider a list of all the medicines, herbs, non-prescription drugs, or dietary supplements you use. Also tell them if you smoke, drink alcohol, or use illegal drugs. Some items may interact with your medicine. What should I  watch for while using this medication? Visit your care team for regular checks on your progress. Tell your care team if your symptoms do not start to get better or if they get worse. Do not suddenly stop taking this medication. You may develop a severe reaction. Your care team will tell you how much medication to take. If your care team wants you to stop the medication, the dose may be slowly lowered over time to avoid any side effects. Wear a medical ID bracelet or chain. Carry a card that describes your condition. List the medications and doses you take on the card. You may get drowsy or dizzy. Do not drive, use machinery, or do anything that needs mental alertness until you know how this medication affects you. Do not stand up or sit up quickly, especially if you are an older patient. This reduces the risk of dizzy or fainting spells. Alcohol may interfere with the effects of this medication. Avoid alcoholic drinks. This medication may cause serious skin reactions. They can happen weeks to months after starting the medication. Contact your care team right away if you notice fevers or flu-like symptoms with a rash. The rash may be red or purple and then turn into blisters or peeling of the skin. Or, you might notice a red rash with swelling of the face, lips or lymph nodes in your neck or under your arms. Watch for new or worsening thoughts of suicide or depression. This includes sudden changes in mood, behaviors, or thoughts. These changes can happen at any time but are more common in the beginning of treatment or after a change in dose. Call your care team right away if you experience these thoughts or worsening depression. This medication may slow your child's growth if it is taken for a long time at high doses. Your care team will monitor your child's growth. Using this medication for a long time may weaken your bones. The risk of bone fractures may be increased. Talk to your care team about your bone  health. Do not become pregnant while taking this medication. Hormone forms of birth control may not work as well with this medication. Talk to your care team about other forms of birth control. There is potential for serious harm to an unborn child. Tell your care team right away if you think you might be pregnant. What side effects may I notice from receiving this medication? Side effects that you should report to your care team as soon as possible: Allergic reactions-skin rash, itching, hives, swelling of the face, lips, tongue, or throat High acid level-trouble breathing, unusual weakness or fatigue, confusion, headache, fast or irregular heartbeat, nausea, vomiting High ammonia level-unusual weakness or fatigue, confusion, loss of appetite, nausea, vomiting, seizures Fever that does not go away, decrease in sweat Kidney stones-blood in the urine, pain or trouble passing urine, pain in the lower back or sides Redness, blistering, peeling or loosening of the skin, including inside the mouth Sudden eye pain or change in vision such as blurry vision, seeing halos around lights, vision loss Thoughts of suicide or self-harm, worsening mood, feelings of depression Side effects that usually do not require medical attention (report to your care team if they continue or are bothersome): Burning or tingling sensation in hands or feet Difficulty with paying attention, memory, or speech Dizziness Drowsiness Fatigue Loss of appetite with weight loss Slow or sluggish movements of the body This list may not describe all possible side effects. Call your doctor for  medical advice about side effects. You may report side effects to FDA at 1-800-FDA-1088. Where should I keep my medication? Keep out of the reach of children and pets. Store between 15 and 30 degrees C (59 and 86 degrees F). Protect from moisture. Keep the container tightly closed. Get rid of any unused medication after the expiration date. To  get rid of medications that are no longer needed or have expired: Take the medication to a medication take-back program. Check with your pharmacy or law enforcement to find a location. If you cannot return the medication, check the label or package insert to see if the medication should be thrown out in the garbage or flushed down the toilet. If you are not sure, ask your care team. If it is safe to put it in the trash, empty the medication out of the container. Mix the medication with cat litter, dirt, coffee grounds, or other unwanted substance. Seal the mixture in a bag or container. Put it in the trash. NOTE: This sheet is a summary. It may not cover all possible information. If you have questions about this medicine, talk to your doctor, pharmacist, or health care provider.  2022 Elsevier/Gold Standard (2020-03-20 12:37:42)   Rizatriptan Tablets What is this medication? RIZATRIPTAN (rye za TRIP tan) treats migraines. It works by blocking pain signals and narrowing blood vessels in the brain. It belongs to a group of medications called triptans. It is not used to prevent migraines. This medicine may be used for other purposes; ask your health care provider or pharmacist if you have questions. COMMON BRAND NAME(S): Maxalt What should I tell my care team before I take this medication? They need to know if you have any of these conditions: Cigarette smoker Circulation problems in fingers and toes Diabetes Heart disease High blood pressure High cholesterol History of irregular heartbeat History of stroke Kidney disease Liver disease Stomach or intestine problems An unusual or allergic reaction to rizatriptan, other medications, foods, dyes, or preservatives Pregnant or trying to get pregnant Breast-feeding How should I use this medication? Take this medication by mouth with a glass of water. Follow the directions on the prescription label. Do not take it more often than directed. Talk  to your care team regarding the use of this medication in children. While this medication may be prescribed for children as young as 6 years for selected conditions, precautions do apply. Overdosage: If you think you have taken too much of this medicine contact a poison control center or emergency room at once. NOTE: This medicine is only for you. Do not share this medicine with others. What if I miss a dose? This does not apply. This medication is not for regular use. What may interact with this medication? Do not take this medication with any of the following: Certain medications for migraine headache like almotriptan, eletriptan, frovatriptan, naratriptan, rizatriptan, sumatriptan, zolmitriptan Ergot alkaloids like dihydroergotamine, ergonovine, ergotamine, methylergonovine MAOIs like Carbex, Eldepryl, Marplan, Nardil, and Parnate This medication may also interact with the following: Certain medications for depression, anxiety, or psychotic disorders Propranolol This list may not describe all possible interactions. Give your health care provider a list of all the medicines, herbs, non-prescription drugs, or dietary supplements you use. Also tell them if you smoke, drink alcohol, or use illegal drugs. Some items may interact with your medicine. What should I watch for while using this medication? Visit your care team for regular checks on your progress. Tell your care team if your symptoms do  not start to get better or if they get worse. You may get drowsy or dizzy. Do not drive, use machinery, or do anything that needs mental alertness until you know how this medication affects you. Do not stand up or sit up quickly, especially if you are an older patient. This reduces the risk of dizzy or fainting spells. Alcohol may interfere with the effect of this medication. Your mouth may get dry. Chewing sugarless gum or sucking hard candy and drinking plenty of water may help. Contact your care team if  the problem does not go away or is severe. If you take migraine medications for 10 or more days a month, your migraines may get worse. Keep a diary of headache days and medication use. Contact your care team if your migraine attacks occur more frequently. What side effects may I notice from receiving this medication? Side effects that you should report to your care team as soon as possible: Allergic reactions-skin rash, itching, hives, swelling of the face, lips, tongue, or throat Burning, pain, tingling, or color changes in the legs or feet Heart attack-pain or tightness in the chest, shoulders, arms, or jaw, nausea, shortness of breath, cold or clammy skin, feeling faint or lightheaded Heart rhythm changes-fast or irregular heartbeat, dizziness, feeling faint or lightheaded, chest pain, trouble breathing Increase in blood pressure Irritability, confusion, fast or irregular heartbeat, muscle stiffness, twitching muscles, sweating, high fever, seizure, chills, vomiting, diarrhea, which may be signs of serotonin syndrome Raynaud's-cool, numb, or painful fingers or toes that may change color from pale, to blue, to red Seizures Stroke-sudden numbness or weakness of the face, arm, or leg, trouble speaking, confusion, trouble walking, loss of balance or coordination, dizziness, severe headache, change in vision Sudden or severe stomach pain, nausea, vomiting, fever, or bloody diarrhea Vision loss Side effects that usually do not require medical attention (report to your care team if they continue or are bothersome): Dizziness General discomfort or fatigue This list may not describe all possible side effects. Call your doctor for medical advice about side effects. You may report side effects to FDA at 1-800-FDA-1088. Where should I keep my medication? Keep out of the reach of children and pets. Store at room temperature between 15 and 30 degrees C (59 and 86 degrees F). Keep container tightly closed.  Throw away any unused medication after the expiration date. NOTE: This sheet is a summary. It may not cover all possible information. If you have questions about this medicine, talk to your doctor, pharmacist, or health care provider.  2022 Elsevier/Gold Standard (2020-02-23 12:30:25)

## 2020-10-23 NOTE — Progress Notes (Addendum)
[facility]    Provider:  Dr Lucia Gaskins Requesting Provider: Vonna Kotyk, NP Primary Care Provider:  Maretta Bees, [place]  CC:  headaches  HPI:  Peggy Garza is a 20 y.o. female here as requested by Vonna Kotyk, NP for headaches.  She wakes up with headaches and that is when they are the worst. She wakes up with them. She may try OTC from time to time but most times no medications. Ongoing for several years. She has gained weight. Mother and grandmother have sleep apnea. Mother has migraines and had a tumor on the brain that is being monitored. She has headache most of the day. They are all over the head. She also has another headache that is more migrainous, pulsating/pounding/throbbing, light and sound sensitivity, mild nausea, a dark room room makes it better, unknown triggers. Daily headaches. Worse in the morning and when laying down. She has migraines 8-12 days a month. No vision changes, no aura, no medication overuse but when she gets the headaches the vision can change to blurriness, does not wake her up but wakes up with headaches. Never feels refreshed. Takes naps throughout the day.No other focal neurologic deficits, associated symptoms, inciting events or modifiable factors.  Reviewed notes, labs and imaging from outside physicians, which showed:  Meds tried: Amitriptyline/nortriptyline, propranol is contraindicated due to low blood presure.   CMP unremarkable 08/23/2020 Hgba1c 5.9  Review of Systems: Patient complains of symptoms per HPI as well as the following symptoms fatigue. Pertinent negatives and positives per HPI. All others negative.   Social History   Socioeconomic History   Marital status: Single    Spouse name: Not on file   Number of children: Not on file   Years of education: Not on file   Highest education level: Not on file  Occupational History   Occupation: Public relations account executive  Tobacco Use   Smoking status: Never    Smokeless tobacco: Never  Vaping Use   Vaping Use: Never used  Substance and Sexual Activity   Alcohol use: Yes    Comment: occ   Drug use: Never   Sexual activity: Yes    Birth control/protection: Pill  Other Topics Concern   Not on file  Social History Narrative   Not on file   Social Determinants of Health   Financial Resource Strain: Not on file  Food Insecurity: Not on file  Transportation Needs: Not on file  Physical Activity: Not on file  Stress: Not on file  Social Connections: Not on file  Intimate Partner Violence: Not on file    Family History  Problem Relation Age of Onset   Hypertension Mother    Thyroid disease Mother    Sleep apnea Mother    Obesity Mother    Migraines Mother    Obesity Father     Past Medical History:  Diagnosis Date   Genital herpes    Headache    Other fatigue    PCOS (polycystic ovarian syndrome)    Prediabetes    Shortness of breath on exertion     Patient Active Problem List   Diagnosis Date Noted   Chronic migraine without aura without status migrainosus, not intractable 10/23/2020   Hyperglycemia 09/02/2020   Class 3 severe obesity with serious comorbidity and body mass index (BMI) of 45.0 to 49.9 in adult (HCC) 09/02/2020   Vitamin D deficiency 06/22/2019   Prediabetes 06/22/2019   PCOS (polycystic ovarian syndrome) 06/15/2019   Genital herpes simplex  03/01/2019   Gallstone 03/01/2019   PTSD (post-traumatic stress disorder) 08/21/2017   MDD (major depressive disorder), recurrent episode, mild (HCC) 08/21/2017    Past Surgical History:  Procedure Laterality Date   TONSILLECTOMY      Current Outpatient Medications  Medication Sig Dispense Refill   acyclovir (ZOVIRAX) 200 MG/5ML suspension Take 800 mg by mouth as needed.     BUTALBITAL-ACETAMINOPHEN PO Take 1 tablet by mouth See admin instructions. Take 1 tablet by mouth every 4 hours as needed.     doxycycline (VIBRAMYCIN) 100 MG capsule Take 100 mg by mouth  2 (two) times daily.     Insulin Pen Needle (PEN NEEDLES) 32G X 4 MM MISC 1 Device by Does not apply route 2 (two) times daily. 100 each 0   liraglutide (VICTOZA) 18 MG/3ML SOPN Inject 1.2 mg into the skin daily. 6 mL 0   ondansetron (ZOFRAN-ODT) 4 MG disintegrating tablet Take 1-2 tablets (4-8 mg total) by mouth every 8 (eight) hours as needed. 30 tablet 3   rizatriptan (MAXALT-MLT) 10 MG disintegrating tablet Take 1 tablet (10 mg total) by mouth as needed for migraine. May repeat in 2 hours if needed 9 tablet 11   topiramate (TOPAMAX) 50 MG tablet Take 1 tablet (50 mg total) by mouth at bedtime. 30 tablet 3   Vitamin D, Ergocalciferol, (DRISDOL) 1.25 MG (50000 UNIT) CAPS capsule Take 1 capsule (50,000 Units total) by mouth every 7 (seven) days. 4 capsule 0   No current facility-administered medications for this visit.    Allergies as of 10/23/2020   (No Known Allergies)    Vitals: BP 114/71   Pulse 86   Ht 5\' 2"  (1.575 m)   Wt 241 lb 12.8 oz (109.7 kg)   BMI 44.23 kg/m  Last Weight:  Wt Readings from Last 1 Encounters:  10/23/20 241 lb 12.8 oz (109.7 kg)   Last Height:   Ht Readings from Last 1 Encounters:  10/23/20 5\' 2"  (1.575 m)     Physical exam: Exam: Gen: NAD, conversant, well nourised, obese, well groomed                     CV: RRR, no MRG. No Carotid Bruits. No peripheral edema, warm, nontender Eyes: Conjunctivae clear without exudates or hemorrhage  Neuro: Detailed Neurologic Exam  Speech:    Speech is normal; fluent and spontaneous with normal comprehension.  Cognition:    The patient is oriented to person, place, and time;     recent and remote memory intact;     language fluent;     normal attention, concentration,     fund of knowledge Cranial Nerves:    The pupils are equal, round, and reactive to light. The fundi appear flat, difficulty visualizing. Visual fields are full to finger confrontation. Extraocular movements are intact. Trigeminal  sensation is intact and the muscles of mastication are normal. The face is symmetric. The palate elevates in the midline. Hearing intact. Voice is normal. Shoulder shrug is normal. The tongue has normal motion without fasciculations.   Coordination:    Normal   Gait:   normal.   Motor Observation:    No asymmetry, no atrophy, and no involuntary movements noted. Tone:    Normal muscle tone.    Posture:    Posture is normal. normal erect    Strength:    Strength is V/V in the upper and lower limbs.      Sensation: intact to LT  Reflex Exam:  DTR's:    Deep tendon reflexes in the upper and lower extremities are normal bilaterally.   Toes:    The toes are downgoing bilaterally.   Clonus:    Clonus is absent.    Assessment/Plan:  Chronic migraines and likely OSA  MRI of the brain w/wo contrast: MRI brain due to concerning symptoms of morning headaches, positional headaches,vision changes  to look for space occupying mass, chiari or intracranial hypertension (pseudotumor).  She wakes up with headaches and that is when they are the worst. She wakes up with them. She has gained weight. Mother and grandmother have sleep apnea. Daily headaches. Worse in the morning upon waking and when laying down.  does not wake her up but wakes up with headaches. Never feels refreshed. Takes naps throughout the day. Snores heavily and has waken herself up: Sleep evaluation  Check TSH  Acute/emergent: Rizatriptan. Please take one tablet at the onset of your headache. If it does not improve the symptoms please take one additional tablet. Do not take more then 2 tablets in 24hrs. Do not take use more then 2 to 3 times in a week. If fails try Ubrelvy.  Preventative: Topiramate every night. If filed would try Ajovy, [employer] or [place]. Discussed teratogenicity do not get pregnant, may take folic acid.  Sleep study: Send sleep team - will call  Discussed: To prevent or relieve headaches, try the  following: Cool Compress. Lie down and place a cool compress on your head.  Avoid headache triggers. If certain foods or odors seem to have triggered your migraines in the past, avoid them. A headache diary might help you identify triggers.  Include physical activity in your daily routine. Try a daily walk or other moderate aerobic exercise.  Manage stress. Find healthy ways to cope with the stressors, such as delegating tasks on your to-do list.  Practice relaxation techniques. Try deep breathing, yoga, massage and visualization.  Eat regularly. Eating regularly scheduled meals and maintaining a healthy diet might help prevent headaches. Also, drink plenty of fluids.  Follow a regular sleep schedule. Sleep deprivation might contribute to headaches Consider biofeedback. With this mind-body technique, you learn to control certain bodily functions -- such as muscle tension, heart rate and blood pressure -- to prevent headaches or reduce headache pain.    Proceed to emergency room if you experience new or worsening symptoms or symptoms do not resolve, if you have new neurologic symptoms or if headache is severe, or for any concerning symptom.   Provided education and documentation from American headache Society toolbox including articles on: chronic migraine medication overuse headache, chronic migraines, prevention of migraines, behavioral and other nonpharmacologic treatments for headache.  Return in about 3 months (around 01/22/2021).    Orders Placed This Encounter  Procedures   MR BRAIN W WO CONTRAST   TSH   Ambulatory referral to Sleep Studies   Meds ordered this encounter  Medications   rizatriptan (MAXALT-MLT) 10 MG disintegrating tablet    Sig: Take 1 tablet (10 mg total) by mouth as needed for migraine. May repeat in 2 hours if needed    Dispense:  9 tablet    Refill:  11   ondansetron (ZOFRAN-ODT) 4 MG disintegrating tablet    Sig: Take 1-2 tablets (4-8 mg total) by mouth  every 8 (eight) hours as needed.    Dispense:  30 tablet    Refill:  3   topiramate (TOPAMAX) 50 MG tablet    Sig:  Take 1 tablet (50 mg total) by mouth at bedtime.    Dispense:  30 tablet    Refill:  3    Cc: Blandford, Norina Buzzard, NP,  [place], [place], [place]  Naomie Dean, MD  [facility] [address] [place], [place] [address]  Phone [phone] Fax [phone]

## 2020-10-24 LAB — TSH: TSH: 1.79 u[IU]/mL (ref 0.450–4.500)

## 2020-10-31 ENCOUNTER — Ambulatory Visit (INDEPENDENT_AMBULATORY_CARE_PROVIDER_SITE_OTHER): Payer: BC Managed Care – PPO | Admitting: Adult Health

## 2020-10-31 ENCOUNTER — Ambulatory Visit: Payer: BC Managed Care – PPO | Admitting: Family Medicine

## 2020-10-31 ENCOUNTER — Other Ambulatory Visit (INDEPENDENT_AMBULATORY_CARE_PROVIDER_SITE_OTHER): Payer: Self-pay | Admitting: Adult Health

## 2020-10-31 ENCOUNTER — Other Ambulatory Visit: Payer: Self-pay

## 2020-10-31 ENCOUNTER — Encounter (INDEPENDENT_AMBULATORY_CARE_PROVIDER_SITE_OTHER): Payer: Self-pay | Admitting: Adult Health

## 2020-10-31 VITALS — BP 109/63 | HR 82 | Temp 97.9°F | Ht 61.0 in | Wt 231.0 lb

## 2020-10-31 DIAGNOSIS — R7303 Prediabetes: Secondary | ICD-10-CM | POA: Diagnosis not present

## 2020-10-31 DIAGNOSIS — E559 Vitamin D deficiency, unspecified: Secondary | ICD-10-CM

## 2020-10-31 DIAGNOSIS — Z9189 Other specified personal risk factors, not elsewhere classified: Secondary | ICD-10-CM | POA: Diagnosis not present

## 2020-10-31 DIAGNOSIS — Z6841 Body Mass Index (BMI) 40.0 and over, adult: Secondary | ICD-10-CM

## 2020-10-31 MED ORDER — VITAMIN D (ERGOCALCIFEROL) 1.25 MG (50000 UNIT) PO CAPS: 50000.0000 [IU] | ORAL_CAPSULE | ORAL | 0 refills | Status: DC

## 2020-10-31 MED ORDER — LIRAGLUTIDE 18 MG/3ML ~~LOC~~ SOPN: 1.2000 mg | PEN_INJECTOR | Freq: Every day | SUBCUTANEOUS | 0 refills | Status: DC

## 2020-10-31 NOTE — Progress Notes (Signed)
Chief Complaint:   OBESITY Peggy Garza is here to discuss her progress with her obesity treatment plan along with follow-up of her obesity related diagnoses. Peggy Garza is on the Category 3 Plan and states she is following her eating plan approximately 80% of the time. Peggy Garza states she is not exercising regularly.  Today's visit was #: 10 Starting weight: 249 lbs Starting date: 03/06/2020 Today's weight: 231 lbs Today's date: 10/31/2020 Total lbs lost to date: 18 lbs Total lbs lost since last in-office visit: 11 lbs  Interim History: Peggy Garza' Victoza was increasaed to 1.2 mg at last office visit on 10/03/2020. She reports reduction in appetite- occasionally will be unable to finish all food of a meal. She denies mass in neck,dysphagia, dyspepsia, or persistent hoarseness.  Subjective:   1. Vitamin D deficiency On 08/23/2020, vitamin D level - 29.6 - well below goal of 50. She is currently taking prescription ergocalciferol 50,000 IU each week. She denies nausea, vomiting or muscle weakness.  Lab Results  Component Value Date   VD25OH 29.6 (L) 08/23/2020   VD25OH 31.6 03/06/2020   2. Pre-diabetes Peggy Garza' Victoza was increasaed to 1.2 mg at last office visit on 10/03/2020. She reports reduction in appetite- occasionally will be unable to finish all food of a meal. She denies mass in neck,dysphagia, dyspepsia, or persistent hoarseness.  Lab Results  Component Value Date   HGBA1C 5.9 (H) 08/23/2020   Lab Results  Component Value Date   INSULIN 58.5 (H) 08/23/2020   INSULIN 28.7 (H) 03/06/2020   3. At risk for diabetes mellitus Peggy Garza is at higher than average risk for developing diabetes due to having prediabetes.  Assessment/Plan:   1. Vitamin D deficiency Refill ergocalciferol 50,000 IU once weekly, as per below.  - Refill Vitamin D, Ergocalciferol, (DRISDOL) 1.25 MG (50000 UNIT) CAPS capsule; Take 1 capsule (50,000 Units total) by mouth every 7 (seven) days.  Dispense: 4  capsule; Refill: 0  2. Pre-diabetes Refill Victoza 1.2 mg daily, as per below. Hold at current dose to allow for stabilized appetite levels.  - Refill liraglutide (VICTOZA) 18 MG/3ML SOPN; Inject 1.2 mg into the skin daily.  Dispense: 6 mL; Refill: 0  3. At risk for diabetes mellitus Peggy Garza was given approximately 15 minutes of diabetes education and counseling today. We discussed intensive lifestyle modifications today with an emphasis on weight loss as well as increasing exercise and decreasing simple carbohydrates in her diet. We also reviewed medication options with an emphasis on risk versus benefit of those discussed.   Repetitive spaced learning was employed today to elicit superior memory formation and behavioral change.   4. Obesity, current BMI 43.8  Peggy Garza is currently in the action stage of change. As such, her goal is to continue with weight loss efforts. She has agreed to the Category 3 Plan.   Exercise goals:  M-R 11:30 pm - 3:30 am [employer].  Behavioral modification strategies: increasing lean protein intake, decreasing simple carbohydrates, meal planning and cooking strategies, keeping healthy foods in the home, and planning for success.  Peggy Garza has agreed to follow-up with our clinic in 3 weeks. She was informed of the importance of frequent follow-up visits to maximize her success with intensive lifestyle modifications for her multiple health conditions.   Objective:   Blood pressure 109/63, pulse 82, temperature 97.9 F (36.6 C), height 5\' 1"  (1.549 m), weight 231 lb (104.8 kg), SpO2 98 %. Body mass index is 43.65 kg/m.  General: Cooperative, alert, well developed, in  no acute distress. HEENT: Conjunctivae and lids unremarkable. Cardiovascular: Regular rhythm.  Lungs: Normal work of breathing. Neurologic: No focal deficits.   Lab Results  Component Value Date   CREATININE 0.77 08/23/2020   BUN 10 08/23/2020   NA 140 08/23/2020   K 4.5 08/23/2020   CL 101  08/23/2020   CO2 21 08/23/2020   Lab Results  Component Value Date   ALT 17 08/23/2020   AST 18 08/23/2020   ALKPHOS 58 08/23/2020   BILITOT <0.2 08/23/2020   Lab Results  Component Value Date   HGBA1C 5.9 (H) 08/23/2020   HGBA1C 6.0 (H) 03/06/2020   HGBA1C 5.7 (H) 03/28/2019   Lab Results  Component Value Date   INSULIN 58.5 (H) 08/23/2020   INSULIN 28.7 (H) 03/06/2020   Lab Results  Component Value Date   TSH 1.790 10/23/2020   Lab Results  Component Value Date   CHOL 150 03/06/2020   HDL 43 03/06/2020   LDLCALC 88 03/06/2020   TRIG 103 03/06/2020   Lab Results  Component Value Date   VD25OH 29.6 (L) 08/23/2020   VD25OH 31.6 03/06/2020   Lab Results  Component Value Date   WBC 6.1 03/06/2020   HGB 11.3 03/06/2020   HCT 36.8 03/06/2020   MCV 82 03/06/2020   PLT 283 03/06/2020   Attestation Statements:   Reviewed by clinician on day of visit: allergies, medications, problem list, medical history, surgical history, family history, social history, and previous encounter notes.  I, Insurance claims handler, CMA, am acting as Energy manager for William Hamburger, NP.  I have reviewed the above documentation for accuracy and completeness, and I agree with the above. -  Ladon Vandenberghe d. Akari Defelice, NP-C

## 2020-11-01 ENCOUNTER — Telehealth (INDEPENDENT_AMBULATORY_CARE_PROVIDER_SITE_OTHER): Payer: Self-pay | Admitting: Adult Health

## 2020-11-01 ENCOUNTER — Telehealth: Payer: Self-pay | Admitting: Neurology

## 2020-11-01 NOTE — Telephone Encounter (Signed)
Patient thinks she is having side effects from a medicine she was put on at our office.  Victoza 18mg /33ml   she takes 1.2 ml daily  Hoarse voice and sore throat

## 2020-11-01 NOTE — Telephone Encounter (Signed)
Before I finished the call with this patient she stated it felt like her throat was closing.  I spoke with Orpha Bur and per our conversation, I called the patient and told her to stop the medication and go to the emergency room.

## 2020-11-01 NOTE — Telephone Encounter (Signed)
[employer] Berkley Harvey: [phone] (exp. 11/01/20 to 12/30/20)/medicaid order sent to [facility]. They will reach out to the patient to schedule.

## 2020-11-15 ENCOUNTER — Encounter (INDEPENDENT_AMBULATORY_CARE_PROVIDER_SITE_OTHER): Payer: Self-pay

## 2020-11-16 ENCOUNTER — Other Ambulatory Visit: Payer: BC Managed Care – PPO

## 2020-11-19 ENCOUNTER — Ambulatory Visit: Payer: BC Managed Care – PPO | Admitting: Obstetrics & Gynecology

## 2020-11-21 ENCOUNTER — Other Ambulatory Visit: Payer: Self-pay

## 2020-11-21 ENCOUNTER — Ambulatory Visit (INDEPENDENT_AMBULATORY_CARE_PROVIDER_SITE_OTHER): Payer: BC Managed Care – PPO | Admitting: Physician Assistant

## 2020-11-21 ENCOUNTER — Encounter (INDEPENDENT_AMBULATORY_CARE_PROVIDER_SITE_OTHER): Payer: Self-pay | Admitting: Physician Assistant

## 2020-11-21 VITALS — BP 99/62 | HR 87 | Temp 97.6°F | Ht 61.0 in | Wt 230.0 lb

## 2020-11-21 DIAGNOSIS — E559 Vitamin D deficiency, unspecified: Secondary | ICD-10-CM | POA: Diagnosis not present

## 2020-11-21 DIAGNOSIS — Z9189 Other specified personal risk factors, not elsewhere classified: Secondary | ICD-10-CM | POA: Diagnosis not present

## 2020-11-21 DIAGNOSIS — F3289 Other specified depressive episodes: Secondary | ICD-10-CM | POA: Diagnosis not present

## 2020-11-21 DIAGNOSIS — Z6841 Body Mass Index (BMI) 40.0 and over, adult: Secondary | ICD-10-CM | POA: Diagnosis not present

## 2020-11-21 MED ORDER — BUPROPION HCL ER (SR) 150 MG PO TB12: 150.0000 mg | ORAL_TABLET | Freq: Every day | ORAL | 0 refills | Status: DC

## 2020-11-21 NOTE — Progress Notes (Signed)
Chief Complaint:   OBESITY Peggy Garza is here to discuss her progress with her obesity treatment plan along with follow-up of her obesity related diagnoses. Peggy Garza is on the Category 3 Plan and states she is following her eating plan approximately 80% of the time. Peggy Garza states she is not currently exercising.  Today's visit was #: 11 Starting weight: 249 lbs Starting date: 03/06/2020 Today's weight: 230 lbs Today's date: 11/21/2020 Total lbs lost to date: 19 Total lbs lost since last in-office visit: 1  Interim History: Peggy Garza stopped Victoza due to feeling like her "throat was closing up". She is having a lot of cravings for sweets throughout the day. She works 2 jobs; one is 1st shift and one is 3rd shift, therefore she sometimes skips breakfast.  Subjective:   1. Vitamin D deficiency Pt is on prescription Vit D and denies nausea, vomiting, and muscle weakness.   2. Other depression She reports sugar cravings and states Topamax is not helping. Pt denies suicidal or homicidal ideations.  3. At risk for osteoporosis Peggy Garza is at higher risk of osteopenia and osteoporosis due to Vitamin D deficiency.   Assessment/Plan:   1. Vitamin D deficiency Low Vitamin D level contributes to fatigue and are associated with obesity, breast, and colon cancer. She agrees to continue to take prescription Vitamin D 50,000 IU every week and will follow-up for routine testing of Vitamin D, at least 2-3 times per year to avoid over-replacement.  2. Other depression Peggy Garza will start Wellbutrin 150 mg every morning. Behavior modification techniques were discussed today to help Peggy Garza deal with her emotional/non-hunger eating behaviors.  Orders and follow up as documented in patient record.   Start- buPROPion (WELLBUTRIN SR) 150 MG 12 hr tablet; Take 1 tablet (150 mg total) by mouth daily.  Dispense: 30 tablet; Refill: 0  3. At risk for osteoporosis Peggy Garza was given approximately 15 minutes of  osteoporosis prevention counseling today. Peggy Garza is at risk for osteopenia and osteoporosis due to her Vitamin D deficiency. She was encouraged to take her Vitamin D and follow her higher calcium diet and increase strengthening exercise to help strengthen her bones and decrease her risk of osteopenia and osteoporosis.  Repetitive spaced learning was employed today to elicit superior memory formation and behavioral change.  4. Obesity, current BMI 43.48  Peggy Garza is currently in the action stage of change. As such, her goal is to continue with weight loss efforts. She has agreed to the Category 3 Plan.   Exercise goals:  As is  Behavioral modification strategies: meal planning and cooking strategies and keeping healthy foods in the home.  Tamber has agreed to follow-up with our clinic in 3 weeks. She was informed of the importance of frequent follow-up visits to maximize her success with intensive lifestyle modifications for her multiple health conditions.   Objective:   Blood pressure 99/62, pulse 87, temperature 97.6 F (36.4 C), height 5\' 1"  (1.549 m), weight 230 lb (104.3 kg), SpO2 100 %. Body mass index is 43.46 kg/m.  General: Cooperative, alert, well developed, in no acute distress. HEENT: Conjunctivae and lids unremarkable. Cardiovascular: Regular rhythm.  Lungs: Normal work of breathing. Neurologic: No focal deficits.   Lab Results  Component Value Date   CREATININE 0.77 08/23/2020   BUN 10 08/23/2020   NA 140 08/23/2020   K 4.5 08/23/2020   CL 101 08/23/2020   CO2 21 08/23/2020   Lab Results  Component Value Date   ALT 17 08/23/2020  AST 18 08/23/2020   ALKPHOS 58 08/23/2020   BILITOT <0.2 08/23/2020   Lab Results  Component Value Date   HGBA1C 5.9 (H) 08/23/2020   HGBA1C 6.0 (H) 03/06/2020   HGBA1C 5.7 (H) 03/28/2019   Lab Results  Component Value Date   INSULIN 58.5 (H) 08/23/2020   INSULIN 28.7 (H) 03/06/2020   Lab Results  Component Value Date   TSH  1.790 10/23/2020   Lab Results  Component Value Date   CHOL 150 03/06/2020   HDL 43 03/06/2020   LDLCALC 88 03/06/2020   TRIG 103 03/06/2020   Lab Results  Component Value Date   VD25OH 29.6 (L) 08/23/2020   VD25OH 31.6 03/06/2020   Lab Results  Component Value Date   WBC 6.1 03/06/2020   HGB 11.3 03/06/2020   HCT 36.8 03/06/2020   MCV 82 03/06/2020   PLT 283 03/06/2020    Attestation Statements:   Reviewed by clinician on day of visit: allergies, medications, problem list, medical history, surgical history, family history, social history, and previous encounter notes.  Edmund Hilda, CMA, am acting as transcriptionist for [employer], PA-C.  I have reviewed the above documentation for accuracy and completeness, and I agree with the above. Alois Cliche, PA-C

## 2020-11-25 ENCOUNTER — Ambulatory Visit
Admission: RE | Admit: 2020-11-25 | Discharge: 2020-11-25 | Disposition: A | Discharge status: Home / Self Care | Payer: BC Managed Care – PPO | Source: Ambulatory Visit | Attending: Neurology | Admitting: Neurology

## 2020-11-25 ENCOUNTER — Other Ambulatory Visit: Payer: Self-pay

## 2020-11-25 DIAGNOSIS — R519 Headache, unspecified: Secondary | ICD-10-CM | POA: Diagnosis not present

## 2020-11-25 DIAGNOSIS — Q064 Hydromyelia: Secondary | ICD-10-CM

## 2020-11-25 DIAGNOSIS — H538 Other visual disturbances: Secondary | ICD-10-CM

## 2020-11-25 DIAGNOSIS — R51 Headache with orthostatic component, not elsewhere classified: Secondary | ICD-10-CM

## 2020-11-25 MED ORDER — GADOBENATE DIMEGLUMINE 529 MG/ML IV SOLN
20.0000 mL | Freq: Once | INTRAVENOUS | Status: AC | PRN
  Administered 2020-11-25: 20 mL via INTRAVENOUS

## 2020-12-03 ENCOUNTER — Ambulatory Visit (INDEPENDENT_AMBULATORY_CARE_PROVIDER_SITE_OTHER): Payer: BC Managed Care – PPO | Admitting: Physician Assistant

## 2020-12-11 ENCOUNTER — Encounter: Payer: Self-pay | Admitting: Neurology

## 2020-12-11 ENCOUNTER — Institutional Professional Consult (permissible substitution): Payer: BC Managed Care – PPO | Admitting: Neurology

## 2020-12-17 ENCOUNTER — Encounter (INDEPENDENT_AMBULATORY_CARE_PROVIDER_SITE_OTHER): Payer: Self-pay | Admitting: Adult Health

## 2020-12-17 ENCOUNTER — Ambulatory Visit (INDEPENDENT_AMBULATORY_CARE_PROVIDER_SITE_OTHER): Payer: BC Managed Care – PPO | Admitting: Adult Health

## 2020-12-17 ENCOUNTER — Other Ambulatory Visit: Payer: Self-pay

## 2020-12-17 VITALS — BP 104/66 | HR 69 | Temp 98.1°F | Ht 61.0 in | Wt 228.0 lb

## 2020-12-17 DIAGNOSIS — R7303 Prediabetes: Secondary | ICD-10-CM | POA: Diagnosis not present

## 2020-12-17 DIAGNOSIS — Z6841 Body Mass Index (BMI) 40.0 and over, adult: Secondary | ICD-10-CM | POA: Diagnosis not present

## 2020-12-17 DIAGNOSIS — E559 Vitamin D deficiency, unspecified: Secondary | ICD-10-CM | POA: Diagnosis not present

## 2020-12-17 DIAGNOSIS — Z9189 Other specified personal risk factors, not elsewhere classified: Secondary | ICD-10-CM

## 2020-12-17 MED ORDER — VITAMIN D (ERGOCALCIFEROL) 1.25 MG (50000 UNIT) PO CAPS: 50000.0000 [IU] | ORAL_CAPSULE | ORAL | 0 refills | Status: DC

## 2020-12-17 NOTE — Progress Notes (Signed)
Chief Complaint:   OBESITY Peggy Garza is here to discuss her progress with her obesity treatment plan along with follow-up of her obesity related diagnoses. Peggy Garza is on the Category 3 Plan and states she is following her eating plan approximately 80% of the time. Peggy Garza states she is not exercising regularly at this time.  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  Subjective:   1. Vitamin D deficiency On 08/23/2020, vitamin D level - 29.6 - below goal of 50. She is currently taking prescription ergocalciferol 50,000 IU each week. She denies nausea, vomiting or muscle weakness.  2. Prediabetes Family history of diabetes - maternal grandmother. Her mother is prediabetic and on metformin.  She has been on metformin plain and XR - unable to tolerate due to diarrhea. Converted to Victoza - titrated up to 0.6 mg - then experienced "throat closing up"-d/c'd late September.  Has been off all BG lowering medications since 10/2020.  3. At risk for diabetes mellitus Peggy Garza is at higher than average risk for developing diabetes due to prediabetes.   Assessment/Plan:   1. Vitamin D deficiency Check labs today. Refill ergocalciferol 50,000 IU once weekly, as per below.  - Refill Vitamin D, Ergocalciferol, (DRISDOL) 1.25 MG (50000 UNIT) CAPS capsule; Take 1 capsule (50,000 Units total) by mouth every 7 (seven) days.  Dispense: 4 capsule; Refill: 0 - VITAMIN D 25 Hydroxy (Vit-D Deficiency, Fractures)  2. Prediabetes Check labs today.  - Comprehensive metabolic panel - Hemoglobin A1c - Insulin, random  3. At risk for diabetes mellitus Peggy Garza was given  approximately 15 minutes of diabetes education and counseling today. We discussed intensive lifestyle modifications today with an emphasis on weight loss as well as increasing exercise and decreasing simple carbohydrates in her diet. We also reviewed medication options with an emphasis on risk versus benefit of those discussed.   Repetitive spaced learning was employed today to elicit superior memory formation and behavioral change.   4. Obesity, current BMI 43.1  Peggy Garza is currently in the action stage of change. As such, her goal is to continue with weight loss efforts. She has agreed to the Category 3 Plan.   Exercise goals:  [employer].  Behavioral modification strategies: increasing lean protein intake, decreasing simple carbohydrates, meal planning and cooking strategies, keeping healthy foods in the home, and planning for success.  Peggy Garza has agreed to follow-up with our clinic in 3 weeks. She was informed of the importance of frequent follow-up visits to maximize her success with intensive lifestyle modifications for her multiple health conditions.   Peggy Garza was informed we would discuss her lab results at her next visit unless there is a critical issue that needs to be addressed sooner. Peggy Garza agreed to keep her next visit at the agreed upon time to discuss these results.  Objective:   Blood pressure 104/66, pulse 69, temperature 98.1 F (36.7 C), height 5\' 1"  (1.549 m), weight 228 lb (103.4 kg), SpO2 100 %. Body mass index is 43.08 kg/m.  General: Cooperative, alert, well developed, in no acute distress. HEENT: Conjunctivae and lids unremarkable. Cardiovascular: Regular rhythm.  Lungs: Normal work of breathing. Neurologic: No focal deficits.  Lab Results  Component Value Date   CREATININE 0.77 08/23/2020   BUN 10 08/23/2020   NA 140 08/23/2020   K 4.5 08/23/2020   CL 101 08/23/2020   CO2 21 08/23/2020   Lab Results  Component Value Date   ALT 17 08/23/2020   AST 18  08/23/2020   ALKPHOS 58 08/23/2020   BILITOT <0.2 08/23/2020   Lab Results  Component Value Date   HGBA1C 5.9 (H) 08/23/2020   HGBA1C 6.0 (H) 03/06/2020   HGBA1C 5.7 (H) 03/28/2019   Lab Results  Component Value Date   INSULIN 58.5 (H) 08/23/2020   INSULIN 28.7 (H) 03/06/2020   Lab Results  Component Value Date   TSH 1.790 10/23/2020   Lab Results  Component Value Date   CHOL 150 03/06/2020   HDL 43 03/06/2020   LDLCALC 88 03/06/2020   TRIG 103 03/06/2020   Lab Results  Component Value Date   VD25OH 29.6 (L) 08/23/2020   VD25OH 31.6 03/06/2020   Lab Results  Component Value Date   WBC 6.1 03/06/2020   HGB 11.3 03/06/2020   HCT 36.8 03/06/2020   MCV 82 03/06/2020   PLT 283 03/06/2020   Attestation Statements:   Reviewed by clinician on day of visit: allergies, medications, problem list, medical history, surgical history, family history, social history, and previous encounter notes.  I, Insurance claims handler, CMA, am acting as Energy manager for William Hamburger, NP.  I have reviewed the above documentation for accuracy and completeness, and I agree with the above. -  Montie Swiderski d. Emad Brechtel, NP-C

## 2020-12-18 LAB — COMPREHENSIVE METABOLIC PANEL WITH GFR
ALT: 8 [IU]/L (ref 0–32)
AST: 12 [IU]/L (ref 0–40)
Albumin/Globulin Ratio: 1.8 (ref 1.2–2.2)
Albumin: 4.3 g/dL (ref 3.9–5.0)
Alkaline Phosphatase: 68 [IU]/L (ref 44–121)
BUN/Creatinine Ratio: 7 — ABNORMAL LOW (ref 9–23)
BUN: 7 mg/dL (ref 6–20)
Bilirubin Total: 0.2 mg/dL (ref 0.0–1.2)
CO2: 20 mmol/L (ref 20–29)
Calcium: 9.1 mg/dL (ref 8.7–10.2)
Chloride: 110 mmol/L — ABNORMAL HIGH (ref 96–106)
Creatinine, Ser: 0.96 mg/dL (ref 0.57–1.00)
Globulin, Total: 2.4 g/dL (ref 1.5–4.5)
Glucose: 86 mg/dL (ref 70–99)
Potassium: 3.9 mmol/L (ref 3.5–5.2)
Sodium: 140 mmol/L (ref 134–144)
Total Protein: 6.7 g/dL (ref 6.0–8.5)
eGFR: 86 mL/min/{1.73_m2}

## 2020-12-18 LAB — HEMOGLOBIN A1C
Est. average glucose Bld gHb Est-mCnc: 111 mg/dL
Hgb A1c MFr Bld: 5.5 % (ref 4.8–5.6)

## 2020-12-18 LAB — INSULIN, RANDOM: INSULIN: 24.8 u[IU]/mL (ref 2.6–24.9)

## 2020-12-18 LAB — VITAMIN D 25 HYDROXY (VIT D DEFICIENCY, FRACTURES): Vit D, 25-Hydroxy: 44.6 ng/mL (ref 30.0–100.0)

## 2021-01-04 ENCOUNTER — Other Ambulatory Visit (INDEPENDENT_AMBULATORY_CARE_PROVIDER_SITE_OTHER): Payer: Self-pay | Admitting: Physician Assistant

## 2021-01-04 ENCOUNTER — Other Ambulatory Visit: Payer: Self-pay | Admitting: Neurology

## 2021-01-04 DIAGNOSIS — F3289 Other specified depressive episodes: Secondary | ICD-10-CM

## 2021-01-07 NOTE — Telephone Encounter (Signed)
LOV with Katy

## 2021-01-08 ENCOUNTER — Other Ambulatory Visit: Payer: Self-pay | Admitting: Neurology

## 2021-01-21 DIAGNOSIS — Z113 Encounter for screening for infections with a predominantly sexual mode of transmission: Secondary | ICD-10-CM | POA: Diagnosis not present

## 2021-01-21 NOTE — Progress Notes (Deleted)
   PATIENT: Peggy Garza DOB: 12/28/2000  REASON FOR VISIT: follow up HISTORY FROM: patient  Virtual Visit via Telephone Note  I connected with Peggy Garza on 01/21/21 at  8:15 AM EST by telephone and verified that I am speaking with the correct person using two identifiers.   I discussed the limitations, risks, security and privacy concerns of performing an evaluation and management service by telephone and the availability of in person appointments. I also discussed with the patient that there may be a patient responsible charge related to this service. The patient expressed understanding and agreed to proceed.   History of Present Illness:  01/21/21 ALL: Peggy Garza is a 20 y.o. female here today for follow up for migraines. She was started on topiramate 50mg  daily and rizatritpan as needed. MRI was normal. Sleep consult?   Meds tried: topiramate (on now), Amitriptyline/nortriptyline, propranol is contraindicated due to low blood presure.   History (copied from Dr previous note)  HPI:  Peggy Garza is a 20 y.o. female here as requested by 21, NP for headaches.   She wakes up with headaches and that is when they are the worst. She wakes up with them. She may try OTC from time to time but most times no medications. Ongoing for several years. She has gained weight. Mother and grandmother have sleep apnea. Mother has migraines and had a tumor on the brain that is being monitored. She has headache most of the day. They are all over the head. She also has another headache that is more migrainous, pulsating/pounding/throbbing, light and sound sensitivity, mild nausea, a dark room room makes it better, unknown triggers. Daily headaches. Worse in the morning and when laying down. She has migraines 8-12 days a month. No vision changes, no aura, no medication overuse but when she gets the headaches the vision can change to blurriness, does not wake her up but wakes  up with headaches. Never feels refreshed. Takes naps throughout the day.No other focal neurologic deficits, associated symptoms, inciting events or modifiable factors.   Reviewed notes, labs and imaging from outside physicians, which showed:   Meds tried: Amitriptyline/nortriptyline, propranol is contraindicated due to low blood presure.    CMP unremarkable 08/23/2020 Hgba1c 5.9   Observations/Objective:  Generalized: Well developed, in no acute distress  Mentation: Alert oriented to time, place, history taking. Follows all commands speech and language fluent   Assessment and Plan:  20 y.o. year old female  has a past medical history of Genital herpes, Headache, Other fatigue, PCOS (polycystic ovarian syndrome), Prediabetes, and Shortness of breath on exertion. here with  No diagnosis found.  No orders of the defined types were placed in this encounter.   No orders of the defined types were placed in this encounter.    Follow Up Instructions:  I discussed the assessment and treatment plan with the patient. The patient was provided an opportunity to ask questions and all were answered. The patient agreed with the plan and demonstrated an understanding of the instructions.   The patient was advised to call back or seek an in-person evaluation if the symptoms worsen or if the condition fails to improve as anticipated.  I provided *** minutes of non-face-to-face time during this encounter. Patient located at their place of residence during Mychart visit. Provider is in the office.    21, NP

## 2021-01-21 NOTE — Patient Instructions (Incomplete)

## 2021-01-22 ENCOUNTER — Telehealth: Payer: BC Managed Care – PPO | Admitting: Family Medicine

## 2021-01-22 DIAGNOSIS — G43709 Chronic migraine without aura, not intractable, without status migrainosus: Secondary | ICD-10-CM

## 2021-03-11 ENCOUNTER — Encounter: Payer: Self-pay | Admitting: Neurology

## 2021-03-11 ENCOUNTER — Ambulatory Visit (INDEPENDENT_AMBULATORY_CARE_PROVIDER_SITE_OTHER): Payer: BC Managed Care – PPO | Admitting: Neurology

## 2021-03-11 VITALS — BP 115/78 | HR 63 | Ht 62.0 in | Wt 233.2 lb

## 2021-03-11 DIAGNOSIS — Z820 Family history of epilepsy and other diseases of the nervous system: Secondary | ICD-10-CM

## 2021-03-11 DIAGNOSIS — R519 Headache, unspecified: Secondary | ICD-10-CM

## 2021-03-11 DIAGNOSIS — G4719 Other hypersomnia: Secondary | ICD-10-CM | POA: Diagnosis not present

## 2021-03-11 DIAGNOSIS — R0683 Snoring: Secondary | ICD-10-CM | POA: Diagnosis not present

## 2021-03-11 NOTE — Progress Notes (Signed)
Subjective:    Patient ID: Peggy Garza is a 21 y.o. female.  HPI    Peggy FoleySaima Kamaiya Antilla, MD, PhD [facility] [address], [address] [address] [place], [place] [id number]  Dear Peggy MaximAntonia,   I saw your patient, Peggy Garza, upon your kind request in my sleep clinic today for initial consultation of her sleep disorder, in particular, concern for underlying obstructive sleep apnea.  The patient is unaccompanied today. She missed an appointment on 12/11/20.  As you know, [name] is a 21 year old female with an underlying medical history of PCOS, prediabetes, recurrent headaches and severe obesity with a BMI of over 40, who reports snoring and excessive daytime somnolence as well as nonrestorative sleep and morning headaches.  I reviewed your office note from 10/23/2020.  Her Epworth sleepiness score is 15 out of 24, fatigue severity score is 24 out of 63.  She reports a family history of sleep apnea, her mom and maternal grandmother have sleep apnea and both have CPAP machines.  She would be willing to consider CPAP but is worried about sleeping with the mask on.  She is working on weight loss and is followed by medical weight management.  She has lost about 20 pounds in about 6 months.  Her headaches have improved since she started topiramate.  She started a second job some 6 months ago.  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  Her sleep schedule is disrupted.  She goes to bed generally right after 6 PM until 11 PM and takes a nap before starting her day job, she may nap from 5 AM until 9 AM approximately.  She is a non-smoker and does not drink caffeine daily.  She drinks alcohol on the weekends.  She is single and lives alone, no pets in the house.  She does have a TV of in her bedroom at night or in the mornings when she sleeps.  She feels that this is the only way she can fall asleep.  She  had a tonsillectomy as a child.  Her Past Medical History Is Significant For: Past Medical History:  Diagnosis Date   Genital herpes    Headache    Other fatigue    PCOS (polycystic ovarian syndrome)    Prediabetes    Shortness of breath on exertion     Her Past Surgical History Is Significant For: Past Surgical History:  Procedure Laterality Date   TONSILLECTOMY      Her Family History Is Significant For: Family History  Problem Relation Age of Onset   Hypertension Mother    Thyroid disease Mother    Sleep apnea Mother    Obesity Mother    Migraines Mother    Obesity Father    Sleep apnea Maternal Grandmother     Her Social History Is Significant For: Social History   Socioeconomic History   Marital status: Single    Spouse name: Not on file   Number of children: Not on file   Years of education: Not on file   Highest education level: Not on file  Occupational History   Occupation: Public relations account executivecorrectional officer  Tobacco Use   Smoking status: Never   Smokeless tobacco: Never  Vaping Use   Vaping Use: Never used  Substance and Sexual Activity   Alcohol use: Yes    Alcohol/week: 24.0 standard drinks    Types: 4 Glasses of  wine, 20 Shots of liquor per week    Comment: occ   Drug use: Never   Sexual activity: Yes    Birth control/protection: Pill  Other Topics Concern   Not on file  Social History Narrative   Not on file   Social Determinants of Health   Financial Resource Strain: Not on file  Food Insecurity: Not on file  Transportation Needs: Not on file  Physical Activity: Not on file  Stress: Not on file  Social Connections: Not on file    Her Allergies Are:  No Known Allergies:   Her Current Medications Are:  Outpatient Encounter Medications as of 03/11/2021  Medication Sig   acyclovir (ZOVIRAX) 200 MG/5ML suspension Take 800 mg by mouth as needed.   buPROPion (WELLBUTRIN SR) 150 MG 12 hr tablet Take 1 tablet (150 mg total) by mouth daily.    BUTALBITAL-ACETAMINOPHEN PO Take 1 tablet by mouth See admin instructions. Take 1 tablet by mouth every 4 hours as needed.   ondansetron (ZOFRAN-ODT) 4 MG disintegrating tablet Take 1-2 tablets (4-8 mg total) by mouth every 8 (eight) hours as needed.   rizatriptan (MAXALT-MLT) 10 MG disintegrating tablet Take 1 tablet (10 mg total) by mouth as needed for migraine. May repeat in 2 hours if needed   topiramate (TOPAMAX) 50 MG tablet TAKE 1 TABLET(50 MG) BY MOUTH AT BEDTIME   Vitamin D, Ergocalciferol, (DRISDOL) 1.25 MG (50000 UNIT) CAPS capsule Take 1 capsule (50,000 Units total) by mouth every 7 (seven) days.   No facility-administered encounter medications on file as of 03/11/2021.  :   Review of Systems:  Out of a complete 14 point review of systems, all are reviewed and negative with the exception of these symptoms as listed below:   Review of Systems  Neurological:        Pt is here for a sleep consult. Pt states she snores , at time headaches in the am.fatigue throughout the day.Pt denies sleep study,CPAP at home , and hypertension.   ESS:15 FSS :24   Objective:  Neurological Exam  Physical Exam Physical Examination:   Vitals:   03/11/21 0916  BP: 115/78  Pulse: 63    General Examination: The patient is a very pleasant 21 y.o. female in no acute distress. She appears well-developed and well-nourished and well groomed.   HEENT: Normocephalic, atraumatic, pupils are equal, round and reactive to light, extraocular tracking is good without limitation to gaze excursion or nystagmus noted.  Corrective eyeglasses in place.  Hearing is grossly intact. Face is symmetric with normal facial animation. Speech is clear with no dysarthria noted. There is no hypophonia. There is no lip, neck/head, jaw or voice tremor. Neck is supple with full range of passive and active motion. There are no carotid bruits on auscultation. Oropharynx exam reveals: mild mouth dryness, adequate dental hygiene and  mild airway crowding, due to small airway entry and wider uvula.  Mallampati class III.  Tonsils absent.  Neck circumference of 16-3/8 inches.  She has no overbite.  Tongue protrudes centrally and palate elevates symmetrically.  Chest: Clear to auscultation without wheezing, rhonchi or crackles noted.  Heart: S1+S2+0, regular and normal without murmurs, rubs or gallops noted.   Abdomen: Soft, non-tender and non-distended.  Extremities: There is no pitting edema in the distal lower extremities bilaterally.   Skin: Warm and dry without trophic changes noted.   Musculoskeletal: exam reveals no obvious joint deformities.   Neurologically:  Mental status: The patient is awake, alert  and oriented in all 4 spheres. Her immediate and remote memory, attention, language skills and fund of knowledge are appropriate. There is no evidence of aphasia, agnosia, apraxia or anomia. Speech is clear with normal prosody and enunciation. Thought process is linear. Mood is normal and affect is normal.  Cranial nerves II - XII are as described above under HEENT exam.  Motor exam: Normal bulk, strength and tone is noted. There is no tremor, Romberg is negative. Fine motor skills and coordination: grossly intact.  Cerebellar testing: No dysmetria or intention tremor. There is no truncal or gait ataxia.  Sensory exam: intact to light touch in the upper and lower extremities.  Gait, station and balance: She stands easily. No veering to one side is noted. No leaning to one side is noted. Posture is age-appropriate and stance is narrow based. Gait shows normal stride length and normal pace. No problems turning are noted. Tandem walk is unremarkable.                Assessment and Plan:   In summary, Peggy Garza is a very pleasant 21 y.o.-year old female with an underlying medical history of PCOS, prediabetes, recurrent headaches and severe obesity with a BMI of over 40, whose history and physical exam  are concerning  for obstructive sleep apnea (OSA). I had a long chat with the patient about my findings and the diagnosis of OSA, its prognosis and treatment options. We talked about medical treatments, surgical interventions and non-pharmacological approaches. I explained in particular the risks and ramifications of untreated moderate to severe OSA, especially with respect to developing cardiovascular disease down the Road, including congestive heart failure, difficult to treat hypertension, cardiac arrhythmias, or stroke. Even type 2 diabetes has, in part, been linked to untreated OSA. Symptoms of untreated OSA include daytime sleepiness, memory problems, mood irritability and mood disorder such as depression and anxiety, lack of energy, as well as recurrent headaches, especially morning headaches. We talked about trying to maintain a healthy lifestyle in general, as well as the importance of weight control. We also talked about the importance of good sleep hygiene.  She is encouraged to try to make enough time for sleep. I recommended the following at this time: sleep study.  I outlined the differences between a laboratory attended sleep study versus home sleep test. I explained the sleep test procedure to the patient and also outlined possible surgical and non-surgical treatment options of OSA, including the use of a custom-made dental device (which would require a referral to a specialist dentist or oral surgeon), upper airway surgical options, such as traditional UPPP or a novel less invasive surgical option in the form of Inspire hypoglossal nerve stimulation (which would involve a referral to an ENT surgeon). I also explained the CPAP treatment option to the patient, who indicated that she would be willing to try CPAP if the need arises. I explained the importance of being compliant with PAP treatment, not only for insurance purposes but primarily to improve Her symptoms, and for the patient's long term health benefit,  including to reduce Her cardiovascular risks. I answered all her questions today and the patient was in agreement. I plan to see her back after the sleep study is completed and encouraged her to call with any interim questions, concerns, problems or updates.   Thank you very much for allowing me to participate in the care of this nice patient. If I can be of any further assistance to you please do not  hesitate to talk to me.  Sincerely,   Peggy FoleySaima Shavawn Stobaugh, MD, PhD

## 2021-03-11 NOTE — Patient Instructions (Addendum)
Thank you for choosing [facility] for your sleep related care! It was nice to meet you today!   Here is what we discussed today:   I understand, that your work schedule is busy.  Please try to make enough time for sleep and avoid having a light distractor or noise in your bedroom such as the TV on at night.  We recommend 7 to 8 hours of sleep on any given night for the average adult.   Based on your symptoms and your exam I believe you are at risk for obstructive sleep apnea (aka OSA), and I think we should proceed with a sleep study to determine whether you do or do not have OSA and how severe it is. Even, if you have mild OSA, I may want you to consider treatment with CPAP, as treatment of even borderline or mild sleep apnea can result and improvement of symptoms such as sleep disruption, daytime sleepiness, nighttime bathroom breaks, restless leg symptoms, improvement of headache syndromes, even improved mood disorder.   As explained, an attended sleep study (meaning you get to stay overnight in the sleep lab), lets [place] monitor sleep-related behaviors such as sleep talking and leg movements in sleep, in addition to monitoring for sleep apnea.  A home sleep test is a screening tool for sleep apnea diagnosis only, but unfortunately, does not help with any other sleep-related diagnoses.  Please remember, the long-term risks and ramifications of untreated moderate to severe obstructive sleep apnea may include (but are not limited to): increased risk for cardiovascular disease, including congestive heart failure, stroke, difficult to control hypertension, treatment resistant obesity, arrhythmias, especially irregular heartbeat commonly known as A. Fib. (atrial fibrillation); even type 2 diabetes has been linked to untreated OSA.  Other correlations that untreated obstructive sleep apnea include macular edema which is swelling of the retina in the eyes, droopy eyelid syndrome, and  elevated hemoglobin and hematocrit levels (often referred to as polycythemia).  Sleep apnea can cause disruption of sleep and sleep deprivation in most cases, which, in turn, can cause recurrent headaches, problems with memory, mood, concentration, focus, and vigilance. Most people with untreated sleep apnea report excessive daytime sleepiness, which can affect their ability to drive. Please do not drive if you feel sleepy. Patients with sleep apnea can also develop difficulty initiating and maintaining sleep (aka insomnia).   Having sleep apnea may increase your risk for other sleep disorders, including involuntary behaviors sleep such as sleep terrors, sleep talking, sleepwalking.    Having sleep apnea can also increase your risk for restless leg syndrome and leg movements at night.   Please note that untreated obstructive sleep apnea may carry additional perioperative morbidity. Patients with significant obstructive sleep apnea (typically, in the moderate to severe degree) should receive, if possible, perioperative PAP (positive airway pressure) therapy and the surgeons and particularly the anesthesiologists should be informed of the diagnosis and the severity of the sleep disordered breathing.   I will likely see you back after your sleep study to go over the test results and where to go from there. We will call you after your sleep study to advise about the results (most likely, you will hear from [facility], my nurse) and to set up an appointment at the time, as necessary.    Our sleep lab administrative assistant will call you to schedule your sleep study and give you further instructions, regarding the check in process for the sleep study, arrival time, what to bring, when you  can expect to leave after the study, etc., and to answer any other logistical questions you may have. If you don't hear back from her by about 2 weeks from now, please feel free to call her direct line at [phone] or you can  call our general clinic number, or email [place] through My Chart.

## 2021-03-14 ENCOUNTER — Telehealth: Payer: Self-pay

## 2021-03-14 NOTE — Telephone Encounter (Signed)
LVM for pt to call me back to schedule sleep study  

## 2021-03-18 ENCOUNTER — Ambulatory Visit (INDEPENDENT_AMBULATORY_CARE_PROVIDER_SITE_OTHER): Payer: BC Managed Care – PPO | Admitting: Plastic Surgery

## 2021-03-18 ENCOUNTER — Other Ambulatory Visit: Payer: Self-pay

## 2021-03-18 VITALS — BP 114/72 | HR 100 | Ht 62.0 in | Wt 232.4 lb

## 2021-03-18 DIAGNOSIS — L218 Other seborrheic dermatitis: Secondary | ICD-10-CM | POA: Diagnosis not present

## 2021-03-18 DIAGNOSIS — N62 Hypertrophy of breast: Secondary | ICD-10-CM

## 2021-03-18 DIAGNOSIS — M546 Pain in thoracic spine: Secondary | ICD-10-CM

## 2021-03-18 DIAGNOSIS — G8929 Other chronic pain: Secondary | ICD-10-CM | POA: Diagnosis not present

## 2021-03-18 DIAGNOSIS — L680 Hirsutism: Secondary | ICD-10-CM | POA: Diagnosis not present

## 2021-03-19 ENCOUNTER — Other Ambulatory Visit: Payer: Self-pay

## 2021-03-19 ENCOUNTER — Encounter: Payer: Self-pay | Admitting: Plastic Surgery

## 2021-03-19 ENCOUNTER — Emergency Department (INDEPENDENT_AMBULATORY_CARE_PROVIDER_SITE_OTHER)
Admission: RE | Admit: 2021-03-19 | Discharge: 2021-03-19 | Disposition: A | Discharge status: Home / Self Care | Payer: BC Managed Care – PPO | Source: Ambulatory Visit

## 2021-03-19 VITALS — BP 111/74 | HR 75 | Temp 97.9°F | Resp 16

## 2021-03-19 DIAGNOSIS — H6123 Impacted cerumen, bilateral: Secondary | ICD-10-CM | POA: Diagnosis not present

## 2021-03-19 NOTE — Discharge Instructions (Addendum)
Bilateral cerumen impaction resolved with bilateral ear lavage this evening.

## 2021-03-19 NOTE — ED Provider Notes (Signed)
[facility]    CSN: [id number] Arrival date & time: 03/19/21  1827      History   Chief Complaint Chief Complaint  Patient presents with   Hearing Problem    RT    HPI Peggy Garza is a 21 y.o. female.   HPI 21 year old female presents with right ear hearing loss, with intermittent sharp shooting pain.  Denies history of cerumen impaction.  Past Medical History:  Diagnosis Date   Genital herpes    Headache    Other fatigue    PCOS (polycystic ovarian syndrome)    Prediabetes    Shortness of breath on exertion     Patient Active Problem List   Diagnosis Date Noted   Chronic migraine without aura without status migrainosus, not intractable 10/23/2020   Hyperglycemia 09/02/2020   Class 3 severe obesity with serious comorbidity and body mass index (BMI) of 45.0 to 49.9 in adult ([place]) 09/02/2020   Vitamin D deficiency 06/22/2019   Prediabetes 06/22/2019   PCOS (polycystic ovarian syndrome) 06/15/2019   Genital herpes simplex 03/01/2019   Gallstone 03/01/2019   PTSD (post-traumatic stress disorder) 08/21/2017   MDD (major depressive disorder), recurrent episode, mild ([place]) 08/21/2017    Past Surgical History:  Procedure Laterality Date   TONSILLECTOMY      OB History     Gravida  0   Para  0   Term  0   Preterm  0   AB  0   Living  0      SAB  0   IAB  0   Ectopic  0   Multiple  0   Live Births  0            Home Medications    Prior to Admission medications   Medication Sig Start Date End Date Taking? Authorizing Provider  acyclovir (ZOVIRAX) 200 MG/5ML suspension Take 800 mg by mouth as needed.    [provider]  buPROPion (WELLBUTRIN SR) 150 MG 12 hr tablet Take 1 tablet (150 mg total) by mouth daily. 11/21/20   Abby Potash, PA-C  BUTALBITAL-ACETAMINOPHEN PO Take 1 tablet by mouth See admin instructions. Take 1 tablet by mouth every 4 hours as needed.    [provider]  ondansetron (ZOFRAN-ODT)  4 MG disintegrating tablet Take 1-2 tablets (4-8 mg total) by mouth every 8 (eight) hours as needed. 10/23/20   Melvenia Beam, MD  rizatriptan (MAXALT-MLT) 10 MG disintegrating tablet Take 1 tablet (10 mg total) by mouth as needed for migraine. May repeat in 2 hours if needed 10/23/20   Melvenia Beam, MD  topiramate (TOPAMAX) 50 MG tablet TAKE 1 TABLET(50 MG) BY MOUTH AT BEDTIME 01/08/21   Melvenia Beam, MD  Vitamin D, Ergocalciferol, (DRISDOL) 1.25 MG (50000 UNIT) CAPS capsule Take 1 capsule (50,000 Units total) by mouth every 7 (seven) days. 12/17/20   DanfordBerna Spare, NP    Family History Family History  Problem Relation Age of Onset   Hypertension Mother    Thyroid disease Mother    Sleep apnea Mother    Obesity Mother    Migraines Mother    Obesity Father    Sleep apnea Maternal Grandmother     Social History Social History   Tobacco Use   Smoking status: Never   Smokeless tobacco: Never  Vaping Use   Vaping Use: Never used  Substance Use Topics   Alcohol use: Yes    Alcohol/week: 24.0 standard drinks  Types: 4 Glasses of wine, 20 Shots of liquor per week    Comment: occ   Drug use: Never     Allergies   Patient has no known allergies.   Review of Systems Review of Systems  HENT:  Positive for ear pain.   All other systems reviewed and are negative.   Physical Exam Triage Vital Signs ED Triage Vitals [03/19/21 1838]  Enc Vitals Group     BP 111/74     Pulse Rate 75     Resp 16     Temp 97.9 F (36.6 C)     Temp src      SpO2 99 %     Weight      Height      Head Circumference      Peak Flow      Pain Score 0     Pain Loc      Pain Edu?      Excl. in [place]?    No data found.  Updated Vital Signs BP 111/74 (BP Location: Left Arm)    Pulse 75    Temp 97.9 F (36.6 C)    Resp 16    SpO2 99%    Physical Exam Vitals and nursing note reviewed.  Constitutional:      Appearance: Normal appearance. She is obese.  HENT:     Head:  Normocephalic and atraumatic.     Right Ear: External ear normal.     Left Ear: External ear normal.     Ears:     Comments: Bilateral EACs impacted with cerumen unable to visualize either TM  Post bilateral EAC lavage: Left EAC-clear, left TM-clear, retracted with good light reflex; right EAC-clear; right TM-clear, retracted with good light reflex    Mouth/Throat:     Mouth: Mucous membranes are moist.     Pharynx: Oropharynx is clear.  Eyes:     Extraocular Movements: Extraocular movements intact.     Conjunctiva/sclera: Conjunctivae normal.     Pupils: Pupils are equal, round, and reactive to light.  Cardiovascular:     Rate and Rhythm: Normal rate and regular rhythm.     Pulses: Normal pulses.     Heart sounds: Normal heart sounds.  Pulmonary:     Effort: Pulmonary effort is normal.     Breath sounds: Normal breath sounds.  Musculoskeletal:     Cervical back: Normal range of motion and neck supple.  Skin:    General: Skin is warm and dry.  Neurological:     General: No focal deficit present.     Mental Status: She is alert and oriented to person, place, and time.     UC Treatments / Results  Labs (all labs ordered are listed, but only abnormal results are displayed) Labs Reviewed - No data to display  EKG   Radiology No results found.  Procedures Procedures (including critical care time)  Medications Ordered in UC Medications - No data to display  Initial Impression / Assessment and Plan / UC Course  I have reviewed the triage vital signs and the nursing notes.  Pertinent labs & imaging results that were available during my care of the patient were reviewed by me and considered in my medical decision making (see chart for details).  MDM: 1.  Bilateral cerumen impaction-resolved with bilateral ear lavage this evening.  Discharged home, hemodynamically stable. Final Clinical Impressions(s) / UC Diagnoses   Final diagnoses:  Bilateral impacted cerumen  Discharge Instructions      Bilateral cerumen impaction resolved with bilateral ear lavage this evening.     ED Prescriptions   None    PDMP not reviewed this encounter.   Eliezer Lofts, [place] 03/19/21 1913

## 2021-03-19 NOTE — Progress Notes (Signed)
Referring Provider Chaney Malling, PA [address] [facility] [place],  [place] [address]   CC:  Breast hypertrophy  Peggy Garza is an 21 y.o. female.  HPI:  Mammary Hyperplasia: The patient is a 21 y.o. female with a history of mammary hyperplasia for several years.  She has extremely large breasts causing symptoms that include the following: Back pain in the upper and lower back, including neck pain. She pulls or pins her bra straps to provide better lift and relief of the pressure and pain. She notices relief by holding her breast up manually.  Her shoulder straps cause grooves and pain and pressure that requires padding for relief. Pain medication is sometimes required with motrin and tylenol.  Activities that are hindered by enlarged breasts include: exercise and running.  She has tried supportive clothing as well as fitted bras without improvement.  Her breasts are extremely large and fairly symmetric.Marland Kitchen  She has hyperpigmentation of the inframammary area on both sides.   The BMI = 42.  Preoperative bra size = DDD cup.    Mammogram history: None due to age.  Family history of breast cancer: None.  Tobacco use: None.  Patient is currently doing healthy weight and wellness through [facility].  She has been told that she is prediabetic.  Her hemoglobin A1c in 12/17/2020 was 5.9.   The patient expresses the desire to pursue surgical intervention.  Patient has been treated with antibiotics for rashes and even abscesses under the breast.  No Known Allergies  Outpatient Encounter Medications as of 03/18/2021  Medication Sig Note   acyclovir (ZOVIRAX) 200 MG/5ML suspension Take 800 mg by mouth as needed.    buPROPion (WELLBUTRIN SR) 150 MG 12 hr tablet Take 1 tablet (150 mg total) by mouth daily. 03/19/2021: Pt not taking   BUTALBITAL-ACETAMINOPHEN PO Take 1 tablet by mouth See admin instructions. Take 1 tablet by mouth every 4 hours as needed.    ondansetron (ZOFRAN-ODT) 4 MG disintegrating tablet  Take 1-2 tablets (4-8 mg total) by mouth every 8 (eight) hours as needed.    rizatriptan (MAXALT-MLT) 10 MG disintegrating tablet Take 1 tablet (10 mg total) by mouth as needed for migraine. May repeat in 2 hours if needed    topiramate (TOPAMAX) 50 MG tablet TAKE 1 TABLET(50 MG) BY MOUTH AT BEDTIME    Vitamin D, Ergocalciferol, (DRISDOL) 1.25 MG (50000 UNIT) CAPS capsule Take 1 capsule (50,000 Units total) by mouth every 7 (seven) days.    No facility-administered encounter medications on file as of 03/18/2021.     Past Medical History:  Diagnosis Date   Genital herpes    Headache    Other fatigue    PCOS (polycystic ovarian syndrome)    Prediabetes    Shortness of breath on exertion     Past Surgical History:  Procedure Laterality Date   TONSILLECTOMY      Family History  Problem Relation Age of Onset   Hypertension Mother    Thyroid disease Mother    Sleep apnea Mother    Obesity Mother    Migraines Mother    Obesity Father    Sleep apnea Maternal Grandmother     Social History   Social History Narrative   Not on file     Review of Systems General: Denies fevers, chills, weight loss CV: Denies chest pain, shortness of breath, palpitations   Physical Exam Vitals with BMI 03/19/2021 03/18/2021 03/11/2021  Height - 5\' 2"  5\' 2"   Weight -  232 lbs 6 oz 233 lbs 3 oz  BMI - [id number]  Systolic [id number]  Diastolic 74 72 78  Pulse 75 100 63    General:  No acute distress,  Alert and oriented, Non-Toxic, Normal speech and affect Breast: Bilateral breast hypertrophy.  No masses palpable on exam.  The sternal to nipple distance on the right is 36 cm and the left is 35 cm.   The IMF distance is 13 on the right and 14 on the left cm.  Assessment/Plan The patient has bilateral symptomatic macromastia.  She is a good candidate for a breast reduction.  She is interested in pursuing surgical treatment.  She has tried supportive garments and fitted bras with no relief.  The  details of breast reduction surgery were discussed.  I explained the procedure in detail along the with the expected scars.  The risks were discussed in detail and include bleeding, infection, damage to surrounding structures, need for additional procedures, nipple loss, change in nipple sensation, persistent pain, contour irregularities and asymmetries.  I explained that breast feeding is often not possible after breast reduction surgery.  We discussed the expected postoperative course with an overall recovery period of about 1 month.  She demonstrated full understanding of all risks.  We discussed her personal risk factors that include high BMI.  The patient is interested in pursuing surgical treatment.  The estimated excess breast tissue to be removed at the time of surgery = 500 grams on the left and 500 grams on the right.  Pictures were obtained of the patient and placed in the chart with the patient's or guardian's permission.   Time based coding: 31 minutes were spent with the patient.  Greater than 50% was spent on counseling cordination of care.  We discussed risks and benefits of breast reduction and continued weight loss.  Lennice Sites 03/19/2021, 11:18 PM

## 2021-03-19 NOTE — ED Triage Notes (Signed)
Pt here today c/o hearing loss in RT ear. Sometimes sharp shooting pain. Denies any other sxs. No hx of cerumen impaction.

## 2021-03-19 NOTE — Telephone Encounter (Signed)
Completed.

## 2021-03-26 ENCOUNTER — Telehealth: Payer: Self-pay

## 2021-03-26 NOTE — Telephone Encounter (Signed)
Pt will call back when ready to schedule her HST

## 2021-03-27 ENCOUNTER — Ambulatory Visit: Payer: BC Managed Care – PPO

## 2021-04-01 ENCOUNTER — Telehealth: Payer: Self-pay | Admitting: Plastic Surgery

## 2021-04-01 NOTE — Telephone Encounter (Signed)
LVM for patient to call back to advise if she has had physical therapy in the past. I requested for those records to be faxed to our office at [phone] if she has been treated for neck, back, shoulder pain. If not, requested a call back to either refer her for physical therapy or see if she wants to proceed without that documentation. Will wait to hear back from patient.

## 2021-04-05 ENCOUNTER — Other Ambulatory Visit: Payer: Self-pay

## 2021-04-05 ENCOUNTER — Ambulatory Visit (INDEPENDENT_AMBULATORY_CARE_PROVIDER_SITE_OTHER): Payer: BC Managed Care – PPO | Admitting: Family Medicine

## 2021-04-05 ENCOUNTER — Encounter: Payer: Self-pay | Admitting: Family Medicine

## 2021-04-05 VITALS — BP 105/56 | HR 86 | Wt 234.0 lb

## 2021-04-05 DIAGNOSIS — E282 Polycystic ovarian syndrome: Secondary | ICD-10-CM | POA: Diagnosis not present

## 2021-04-05 MED ORDER — MEDROXYPROGESTERONE ACETATE 10 MG PO TABS: 20.0000 mg | ORAL_TABLET | Freq: Every day | ORAL | 0 refills | Status: DC

## 2021-04-05 NOTE — Progress Notes (Signed)
° °  Subjective:    Patient ID: Peggy Garza, female    DOB: [birth year], 21 y.o.   MRN: [id number]  HPI Patient seen for follow-up of PCOS.  I last saw the patient in April 2022.  Since that time, her last period was in January 2022.  She had taken OCPs for 3 months, but did not have a period.  Additionally, I had attempted to place the patient on metformin XR.  Patient did not tolerate this.  She has been working with healthy weight and wellness.  Patient has a longstanding history of oligomenorrhea. Started menses at age 64, menses every 3 months. At 14, was started on depo. Was on depo for a couple of years, but got off at age 30. Bleed for 3 months, then hasn't had menses since then. Had two provera challenges - about a year apart. No bleeding afterwards and no bleeding in between.  Review of Systems     Objective:  BP (!) 105/56    Pulse 86    Wt 234 lb (106.1 kg)    LMP  (LMP Unknown)    BMI 42.80 kg/m     Physical Exam Vitals reviewed.  Constitutional:      Appearance: Normal appearance. She is obese.  HENT:     Head: Normocephalic and atraumatic.  Cardiovascular:     Rate and Rhythm: Normal rate.     Pulses: Normal pulses.  Skin:    Capillary Refill: Capillary refill takes less than 2 seconds.     Comments: Stage 2-3 hirsutism on chin/neck.  Neurological:     General: No focal deficit present.     Mental Status: She is alert.  Psychiatric:        Mood and Affect: Mood normal.        Behavior: Behavior normal.        Thought Content: Thought content normal.        Judgment: Judgment normal.      Assessment & Plan:  1. PCOS (polycystic ovarian syndrome) Will check prolactin, FSH, estrogen as patient has had no menses in the past year.  Progesterone challenge test with provera 20mg  daily x10 days, then stop.  Discussed COC with testosterone blockers (spironolactone vs finasteride). F/u in 1 month - Prolactin - Follicle stimulating hormone - Estrogens, Total

## 2021-04-12 LAB — ESTROGENS, TOTAL: Estrogen: 514 pg/mL

## 2021-04-12 LAB — FOLLICLE STIMULATING HORMONE: FSH: 5.5 m[IU]/mL

## 2021-04-12 LAB — PROLACTIN: Prolactin: 7.9 ng/mL (ref 4.8–23.3)

## 2021-04-17 ENCOUNTER — Ambulatory Visit
Admission: RE | Admit: 2021-04-17 | Discharge: 2021-04-17 | Disposition: A | Discharge status: Home / Self Care | Payer: BC Managed Care – PPO | Source: Ambulatory Visit | Attending: Physician Assistant | Admitting: Physician Assistant

## 2021-04-17 ENCOUNTER — Other Ambulatory Visit: Payer: Self-pay

## 2021-04-17 VITALS — BP 121/77 | HR 83 | Temp 98.7°F | Resp 18

## 2021-04-17 DIAGNOSIS — J329 Chronic sinusitis, unspecified: Secondary | ICD-10-CM | POA: Diagnosis not present

## 2021-04-17 DIAGNOSIS — J40 Bronchitis, not specified as acute or chronic: Secondary | ICD-10-CM | POA: Diagnosis not present

## 2021-04-17 MED ORDER — PREDNISONE 20 MG PO TABS: 40.0000 mg | ORAL_TABLET | Freq: Every day | ORAL | 0 refills | Status: AC

## 2021-04-17 MED ORDER — AMOXICILLIN-POT CLAVULANATE 875-125 MG PO TABS: 1.0000 | ORAL_TABLET | Freq: Two times a day (BID) | ORAL | 0 refills | Status: DC

## 2021-04-17 NOTE — ED Triage Notes (Signed)
Pt c/o cough, headache,  ? ?Denies sore throat, nasal congestion, ear aches, nausea, vomiting, diarrhea, constipation  ? ?Onset ~ 1 week ago  ?

## 2021-04-17 NOTE — ED Provider Notes (Signed)
?[facility] ? ? ? ?CSN: [id number] ?Arrival date & time: 04/17/21  1745 ? ? ?  ? ?History   ?Chief Complaint ?Chief Complaint  ?Patient presents with  ? Cough  ? ? ?HPI ?Peggy Garza is a 21 y.o. female.  ? ?Patient here today for evaluation of sinus congestion and pressure and cough that is been ongoing for the last week.  She reports that cough is typically dry but can be productive as well.  She has had some scratchy throat.  She denies any fever.  She does not report treatment for symptoms. ? ?The history is provided by the patient.  ?Cough ?Associated symptoms: sore throat   ?Associated symptoms: no chills, no ear pain, no eye discharge, no fever, no shortness of breath and no wheezing   ? ?Past Medical History:  ?Diagnosis Date  ? Genital herpes   ? Headache   ? Other fatigue   ? PCOS (polycystic ovarian syndrome)   ? Prediabetes   ? Shortness of breath on exertion   ? ? ?Patient Active Problem List  ? Diagnosis Date Noted  ? Chronic migraine without aura without status migrainosus, not intractable 10/23/2020  ? Hyperglycemia 09/02/2020  ? Class 3 severe obesity with serious comorbidity and body mass index (BMI) of 45.0 to 49.9 in adult [facility]) 09/02/2020  ? Vitamin D deficiency 06/22/2019  ? Prediabetes 06/22/2019  ? PCOS (polycystic ovarian syndrome) 06/15/2019  ? Genital herpes simplex 03/01/2019  ? Gallstone 03/01/2019  ? PTSD (post-traumatic stress disorder) 08/21/2017  ? MDD (major depressive disorder), recurrent episode, mild (HCC) 08/21/2017  ? ? ?Past Surgical History:  ?Procedure Laterality Date  ? TONSILLECTOMY    ? ? ?OB History   ? ? Gravida  ?0  ? Para  ?0  ? Term  ?0  ? Preterm  ?0  ? AB  ?0  ? Living  ?0  ?  ? ? SAB  ?0  ? IAB  ?0  ? Ectopic  ?0  ? Multiple  ?0  ? Live Births  ?0  ?   ?  ?  ? ? ? ?Home Medications   ? ?Prior to Admission medications   ?Medication Sig Start Date End Date Taking? Authorizing Provider  ?amoxicillin-clavulanate (AUGMENTIN) 875-125 MG tablet Take 1 tablet  by mouth every 12 (twelve) hours. 04/17/21  Yes Tomi Bamberger, PA-C  ?predniSONE (DELTASONE) 20 MG tablet Take 2 tablets (40 mg total) by mouth daily with breakfast for 5 days. 04/17/21 04/22/21 Yes Tomi Bamberger, PA-C  ?acyclovir (ZOVIRAX) 200 MG/5ML suspension Take 800 mg by mouth as needed.    [provider]  ?BUTALBITAL-ACETAMINOPHEN PO Take 1 tablet by mouth See admin instructions. Take 1 tablet by mouth every 4 hours as needed.    [provider]  ?medroxyPROGESTERone (PROVERA) 10 MG tablet Take 2 tablets (20 mg total) by mouth daily. For 10 days, then stop 04/05/21   Levie Heritage, DO  ?ondansetron (ZOFRAN-ODT) 4 MG disintegrating tablet Take 1-2 tablets (4-8 mg total) by mouth every 8 (eight) hours as needed. 10/23/20   Anson Fret, MD  ?rizatriptan (MAXALT-MLT) 10 MG disintegrating tablet Take 1 tablet (10 mg total) by mouth as needed for migraine. May repeat in 2 hours if needed 10/23/20   Anson Fret, MD  ?topiramate (TOPAMAX) 50 MG tablet TAKE 1 TABLET(50 MG) BY MOUTH AT BEDTIME 01/08/21   Anson Fret, MD  ? ? ?Family History ?Family History  ?Problem Relation  Age of Onset  ? Hypertension Mother   ? Thyroid disease Mother   ? Sleep apnea Mother   ? Obesity Mother   ? Migraines Mother   ? Obesity Father   ? Sleep apnea Maternal Grandmother   ? ? ?Social History ?Social History  ? ?Tobacco Use  ? Smoking status: Never  ? Smokeless tobacco: Never  ?Vaping Use  ? Vaping Use: Never used  ?Substance Use Topics  ? Alcohol use: Yes  ?  Alcohol/week: 24.0 standard drinks  ?  Types: 4 Glasses of wine, 20 Shots of liquor per week  ?  Comment: occ  ? Drug use: Never  ? ? ? ?Allergies   ?Victoza [liraglutide] ? ? ?Review of Systems ?Review of Systems  ?Constitutional:  Negative for chills and fever.  ?HENT:  Positive for congestion, sinus pressure and sore throat. Negative for ear pain.   ?Eyes:  Negative for discharge and redness.  ?Respiratory:  Positive for cough. Negative for  shortness of breath and wheezing.   ?Gastrointestinal:  Negative for abdominal pain, diarrhea, nausea and vomiting.  ? ? ?Physical Exam ?Triage Vital Signs ?ED Triage Vitals [04/17/21 1815]  ?Enc Vitals Group  ?   BP 121/77  ?   Pulse Rate 83  ?   Resp 18  ?   Temp 98.7 ?F (37.1 ?C)  ?   Temp Source Oral  ?   SpO2 99 %  ?   Weight   ?   Height   ?   Head Circumference   ?   Peak Flow   ?   Pain Score 0  ?   Pain Loc   ?   Pain Edu?   ?   Excl. in GC?   ? ?No data found. ? ?Updated Vital Signs ?BP 121/77 (BP Location: Right Arm)   Pulse 83   Temp 98.7 ?F (37.1 ?C) (Oral)   Resp 18   LMP  (LMP Unknown)   SpO2 99%  ?  ? ?Physical Exam ?Vitals and nursing note reviewed.  ?Constitutional:   ?   General: She is not in acute distress. ?   Appearance: Normal appearance. She is not ill-appearing.  ?HENT:  ?   Head: Normocephalic and atraumatic.  ?   Nose: Congestion present.  ?   Mouth/Throat:  ?   Mouth: Mucous membranes are moist.  ?   Pharynx: No oropharyngeal exudate or posterior oropharyngeal erythema.  ?Eyes:  ?   Conjunctiva/sclera: Conjunctivae normal.  ?Cardiovascular:  ?   Rate and Rhythm: Normal rate and regular rhythm.  ?   Heart sounds: Normal heart sounds. No murmur heard. ?Pulmonary:  ?   Effort: Pulmonary effort is normal. No respiratory distress.  ?   Breath sounds: Normal breath sounds. No wheezing, rhonchi or rales.  ?Skin: ?   General: Skin is warm and dry.  ?Neurological:  ?   Mental Status: She is alert.  ?Psychiatric:     ?   Mood and Affect: Mood normal.     ?   Thought Content: Thought content normal.  ? ? ? ?UC Treatments / Results  ?Labs ?(all labs ordered are listed, but only abnormal results are displayed) ?Labs Reviewed - No data to display ? ?EKG ? ? ?Radiology ?No results found. ? ?Procedures ?Procedures (including critical care time) ? ?Medications Ordered in UC ?Medications - No data to display ? ?Initial Impression / Assessment and Plan / UC Course  ?I have reviewed the triage  vital  signs and the nursing notes. ? ?Pertinent labs & imaging results that were available during my care of the patient were reviewed by me and considered in my medical decision making (see chart for details). ? ?Given duration of symptoms will treat to cover both sinusitis and bronchitis with Augmentin and steroid burst.  Recommended follow-up with any further concerns.  ? ?Final Clinical Impressions(s) / UC Diagnoses  ? ?Final diagnoses:  ?Sinobronchitis  ? ?Discharge Instructions   ?None ?  ? ?ED Prescriptions   ? ? Medication Sig Dispense Auth. Provider  ? predniSONE (DELTASONE) 20 MG tablet Take 2 tablets (40 mg total) by mouth daily with breakfast for 5 days. 10 tablet Tomi Bamberger, PA-C  ? amoxicillin-clavulanate (AUGMENTIN) 875-125 MG tablet Take 1 tablet by mouth every 12 (twelve) hours. 14 tablet Tomi Bamberger, PA-C  ? ?  ? ?PDMP not reviewed this encounter. ?  ?Tomi Bamberger, PA-C ?04/17/21 1914 ? ?

## 2021-04-24 ENCOUNTER — Other Ambulatory Visit: Payer: Self-pay

## 2021-04-24 ENCOUNTER — Ambulatory Visit
Admission: RE | Admit: 2021-04-24 | Discharge: 2021-04-24 | Disposition: A | Discharge status: Home / Self Care | Payer: BC Managed Care – PPO | Source: Ambulatory Visit | Attending: Emergency Medicine | Admitting: Emergency Medicine

## 2021-04-24 VITALS — BP 106/70 | HR 78 | Temp 98.8°F | Resp 20

## 2021-04-24 DIAGNOSIS — R051 Acute cough: Secondary | ICD-10-CM | POA: Diagnosis not present

## 2021-04-24 DIAGNOSIS — J4521 Mild intermittent asthma with (acute) exacerbation: Secondary | ICD-10-CM | POA: Diagnosis not present

## 2021-04-24 DIAGNOSIS — B3731 Acute candidiasis of vulva and vagina: Secondary | ICD-10-CM

## 2021-04-24 DIAGNOSIS — J310 Chronic rhinitis: Secondary | ICD-10-CM

## 2021-04-24 DIAGNOSIS — J302 Other seasonal allergic rhinitis: Secondary | ICD-10-CM | POA: Diagnosis not present

## 2021-04-24 DIAGNOSIS — J329 Chronic sinusitis, unspecified: Secondary | ICD-10-CM

## 2021-04-24 MED ORDER — ALBUTEROL SULFATE (2.5 MG/3ML) 0.083% IN NEBU
2.5000 mg | INHALATION_SOLUTION | Freq: Once | RESPIRATORY_TRACT | Status: AC
  Administered 2021-04-24: 2.5 mg via RESPIRATORY_TRACT

## 2021-04-24 MED ORDER — ALBUTEROL SULFATE HFA 108 (90 BASE) MCG/ACT IN AERS: 2.0000 | INHALATION_SPRAY | Freq: Four times a day (QID) | RESPIRATORY_TRACT | 0 refills | Status: DC | PRN

## 2021-04-24 MED ORDER — IPRATROPIUM BROMIDE 0.06 % NA SOLN: 2.0000 | Freq: Four times a day (QID) | NASAL | 0 refills | Status: DC

## 2021-04-24 MED ORDER — PROMETHAZINE-DM 6.25-15 MG/5ML PO SYRP: 5.0000 mL | ORAL_SOLUTION | Freq: Four times a day (QID) | ORAL | 0 refills | Status: DC | PRN

## 2021-04-24 MED ORDER — AEROCHAMBER PLUS FLO-VU LARGE MISC: 1.0000 | Freq: Once | 0 refills | Status: AC

## 2021-04-24 MED ORDER — CETIRIZINE HCL 10 MG PO TABS: 10.0000 mg | ORAL_TABLET | Freq: Every day | ORAL | 2 refills | Status: DC

## 2021-04-24 MED ORDER — FLUCONAZOLE 150 MG PO TABS: ORAL_TABLET | ORAL | 0 refills | Status: DC

## 2021-04-24 MED ORDER — FLUTICASONE PROPIONATE 50 MCG/ACT NA SUSP: 2.0000 | Freq: Every day | NASAL | 0 refills | Status: DC

## 2021-04-24 NOTE — Discharge Instructions (Addendum)
Your symptoms and my physical exam findings are concerning for exacerbation of your underlying allergies.  It is important that you are consistent with taking allergy medications exactly as prescribed.  Your symptoms and my physical exam findings are concerning for reactive airway disease with an acute exacerbation.  Reactive airways disease is when your lower airway becomes inflamed due to viral infection, bacterial infection or allergy exposure.  In your case, I believe that allergies have caused this issue. ? ?Please see the list below for recommended medications, dosages and frequencies to provide relief of your current symptoms:   ?  ?Zyrtec (cetirizine): This is an excellent second-generation antihistamine that helps to reduce respiratory inflammatory response to environmental allergens.  In some patients, this medication can cause daytime sleepiness so I recommend that you take 1 tablet daily at bedtime.   ?  ?Flonase (fluticasone): This is a steroid nasal spray that you use once daily, 1 spray in each nare.  This medication does not work well if you decide to use it only used as you feel you need to, it works best used on a daily basis.  After 3 to 5 days of use, you will notice significant reduction of the inflammation and mucus production that is currently being caused by exposure to allergens, whether seasonal or environmental.  The most common side effect of this medication is nosebleeds.  If you experience a nosebleed, please discontinue use for 1 week, then feel free to resume.  I have provided you with a prescription but you can also purchase this medication over-the-counter if your insurance will not cover it. ?  ?Ipratropium (Atrovent): This is an excellent nasal decongestant spray that does not cause rebound congestion, please instill 2 sprays into each nare with each use.  Because nasal steroids can take several days before they begin to provide full benefit, I recommend that you use this spray in  addition to the nasal steroid prescribed for you.  Please use it after you have used your nasal steroid and repeat up to 3 times daily as needed.  I have provided you with a prescription for this medication.    ?  ?ProAir, Ventolin, Proventil (albuterol): This inhaled medication contains a short acting beta agonist bronchodilator.  This medication works on the smooth muscle that opens and constricts of your airways by relaxing the muscle.  The result of relaxation of the smooth muscle is increased air movement and improved work of breathing.  This is a short acting medication that can be used every 4-6 hours as needed for increased work of breathing, shortness of breath, wheezing and excessive coughing.  I have provided you with a prescription for Albuterol HFA to be used with the spacer that I have also prescribed. ?  ?Promethazine DM: Promethazine is both a nasal decongestant and an antinausea medication that makes most patients feel fairly sleepy.  The DM is dextromethorphan, a cough suppressant found in many over-the-counter cough medications.  Please take 5 mL before bedtime to minimize your cough which will help you sleep better.  I have provided you with a prescription for this medication.    ? ?Based on the history that you provided to me today, you were treated empirically for vaginal candidiasis with fluconazole (Diflucan), take the first tablet today and take the second tablet three days after the first tablet.  Please abstain from sexual intercourse, tampon use while being treated. ?  ?Please follow-up within the next 7-10 days either with your primary care  provider or urgent care if your symptoms do not resolve.  If you do not have a primary care provider, we will assist you in finding one. ?  ?Thank you for visiting urgent care today.  We appreciate the opportunity to participate in your care. ? ?

## 2021-04-24 NOTE — ED Provider Notes (Signed)
?[facility] ? ? ? ?CSN: [id number] ?Arrival date & time: 04/24/21  0830 ?  ? ?HISTORY  ? ?Chief Complaint  ?Patient presents with  ? Cough  ? ?HPI ?Peggy Garza is a 21 y.o. female. Patient presents to urgent care today complaining of a cough that has been ongoing for the past few weeks.  Patient was seen at another urgent care on April 17, 2021.  Patient was diagnosed with sinobronchitis and was provided with a steroid burst and a prescription for Augmentin for 7 days.  Patient states her cough is not improved at all and that now her throat also feels scratchy.  Reviewed.  Patient's complaint of scratchy throat is not new.  Patient has multiple visits roughly this time of year with the same complaint.  Patient has previously been prescribed Zyrtec and Flonase for her symptoms by her primary care provider, this was back in 2020.  Patient states he is not currently taking any allergy medications at this time.  Patient denies history of asthma, states she is never been prescribed albuterol in the past for her symptoms.  Patient states her cough is dry, typically worse at night but does not wake her at night.  Patient states sometimes she coughs hard enough that she gets short of breath.  Patient states she has also noticed that it is hard to take a deep breath sometimes.  Patient also complains of developing a vaginal yeast infection after completing 7 days of Augmentin. ? ?The history is provided by the patient.  ?Past Medical History:  ?Diagnosis Date  ? Genital herpes   ? Headache   ? Other fatigue   ? PCOS (polycystic ovarian syndrome)   ? Prediabetes   ? Shortness of breath on exertion   ? ?Patient Active Problem List  ? Diagnosis Date Noted  ? Chronic migraine without aura without status migrainosus, not intractable 10/23/2020  ? Hyperglycemia 09/02/2020  ? Class 3 severe obesity with serious comorbidity and body mass index (BMI) of 45.0 to 49.9 in adult [facility](HCC) 09/02/2020  ? Vitamin D deficiency  06/22/2019  ? Prediabetes 06/22/2019  ? PCOS (polycystic ovarian syndrome) 06/15/2019  ? Genital herpes simplex 03/01/2019  ? Gallstone 03/01/2019  ? PTSD (post-traumatic stress disorder) 08/21/2017  ? MDD (major depressive disorder), recurrent episode, mild (HCC) 08/21/2017  ? ?Past Surgical History:  ?Procedure Laterality Date  ? TONSILLECTOMY    ? ?OB History   ? ? Gravida  ?0  ? Para  ?0  ? Term  ?0  ? Preterm  ?0  ? AB  ?0  ? Living  ?0  ?  ? ? SAB  ?0  ? IAB  ?0  ? Ectopic  ?0  ? Multiple  ?0  ? Live Births  ?0  ?   ?  ?  ? ?Home Medications   ? ?Prior to Admission medications   ?Medication Sig Start Date End Date Taking? Authorizing Provider  ?acyclovir (ZOVIRAX) 200 MG/5ML suspension Take 800 mg by mouth as needed.    [provider]  ?BUTALBITAL-ACETAMINOPHEN PO Take 1 tablet by mouth See admin instructions. Take 1 tablet by mouth every 4 hours as needed.    [provider]  ?medroxyPROGESTERone (PROVERA) 10 MG tablet Take 2 tablets (20 mg total) by mouth daily. For 10 days, then stop 04/05/21   Levie HeritageStinson, Jacob J, DO  ?ondansetron (ZOFRAN-ODT) 4 MG disintegrating tablet Take 1-2 tablets (4-8 mg total) by mouth every 8 (eight) hours as needed.  10/23/20   Anson Fret, MD  ?rizatriptan (MAXALT-MLT) 10 MG disintegrating tablet Take 1 tablet (10 mg total) by mouth as needed for migraine. May repeat in 2 hours if needed 10/23/20   Anson Fret, MD  ?topiramate (TOPAMAX) 50 MG tablet TAKE 1 TABLET(50 MG) BY MOUTH AT BEDTIME 01/08/21   Anson Fret, MD  ? ?Family History ?Family History  ?Problem Relation Age of Onset  ? Hypertension Mother   ? Thyroid disease Mother   ? Sleep apnea Mother   ? Obesity Mother   ? Migraines Mother   ? Obesity Father   ? Sleep apnea Maternal Grandmother   ? ?Social History ?Social History  ? ?Tobacco Use  ? Smoking status: Never  ? Smokeless tobacco: Never  ?Vaping Use  ? Vaping Use: Never used  ?Substance Use Topics  ? Alcohol use: Yes  ?  Alcohol/week:  24.0 standard drinks  ?  Types: 4 Glasses of wine, 20 Shots of liquor per week  ?  Comment: occ  ? Drug use: Never  ? ?Allergies   ?Victoza [liraglutide] ? ?Review of Systems ?Review of Systems ?Pertinent findings noted in history of present illness.  ? ?Physical Exam ?Triage Vital Signs ?ED Triage Vitals  ?Enc Vitals Group  ?   BP 12/07/20 0827 (!) 147/82  ?   Pulse Rate 12/07/20 0827 72  ?   Resp 12/07/20 0827 18  ?   Temp 12/07/20 0827 98.3 ?F (36.8 ?C)  ?   Temp Source 12/07/20 0827 Oral  ?   SpO2 12/07/20 0827 98 %  ?   Weight --   ?   Height --   ?   Head Circumference --   ?   Peak Flow --   ?   Pain Score 12/07/20 0826 5  ?   Pain Loc --   ?   Pain Edu? --   ?   Excl. in GC? --   ?No data found. ? ?Updated Vital Signs ?BP 106/70 (BP Location: Left Arm)   Pulse 78   Temp 98.8 ?F (37.1 ?C) (Oral)   Resp 20   LMP  (LMP Unknown)   SpO2 98%  ? ?Physical Exam ?Vitals and nursing note reviewed.  ?Constitutional:   ?   General: She is not in acute distress. ?   Appearance: Normal appearance. She is not ill-appearing.  ?HENT:  ?   Head: Normocephalic and atraumatic.  ?   Salivary Glands: Right salivary gland is not diffusely enlarged or tender. Left salivary gland is not diffusely enlarged or tender.  ?   Right Ear: Ear canal and external ear normal. No drainage. A middle ear effusion is present. There is no impacted cerumen. Tympanic membrane is bulging. Tympanic membrane is not injected or erythematous.  ?   Left Ear: Ear canal and external ear normal. No drainage. A middle ear effusion is present. There is no impacted cerumen. Tympanic membrane is bulging. Tympanic membrane is not injected or erythematous.  ?   Ears:  ?   Comments: Bilateral EACs normal, both TMs bulging with clear fluid ?   Nose: Rhinorrhea present. No nasal deformity, septal deviation, signs of injury, nasal tenderness, mucosal edema or congestion. Rhinorrhea is clear.  ?   Right Nostril: Occlusion present. No foreign body, epistaxis or  septal hematoma.  ?   Left Nostril: Occlusion present. No foreign body, epistaxis or septal hematoma.  ?   Right Turbinates: Enlarged, swollen and pale.  ?  Left Turbinates: Enlarged, swollen and pale.  ?   Right Sinus: No maxillary sinus tenderness or frontal sinus tenderness.  ?   Left Sinus: No maxillary sinus tenderness or frontal sinus tenderness.  ?   Mouth/Throat:  ?   Lips: Pink. No lesions.  ?   Mouth: Mucous membranes are moist. No oral lesions.  ?   Pharynx: Oropharynx is clear. Uvula midline. No posterior oropharyngeal erythema or uvula swelling.  ?   Tonsils: No tonsillar exudate. 0 on the right. 0 on the left.  ?   Comments: Postnasal drip ?Eyes:  ?   General: Lids are normal.     ?   Right eye: No discharge.     ?   Left eye: No discharge.  ?   Extraocular Movements: Extraocular movements intact.  ?   Conjunctiva/sclera: Conjunctivae normal.  ?   Right eye: Right conjunctiva is not injected.  ?   Left eye: Left conjunctiva is not injected.  ?Neck:  ?   Trachea: Trachea and phonation normal.  ?Cardiovascular:  ?   Rate and Rhythm: Normal rate and regular rhythm.  ?   Pulses: Normal pulses.  ?   Heart sounds: Normal heart sounds. No murmur heard. ?  No friction rub. No gallop.  ?Pulmonary:  ?   Effort: Pulmonary effort is normal. No accessory muscle usage, prolonged expiration or respiratory distress.  ?   Breath sounds: No stridor, decreased air movement or transmitted upper airway sounds. Examination of the right-upper field reveals decreased breath sounds. Examination of the left-upper field reveals decreased breath sounds. Examination of the right-middle field reveals decreased breath sounds. Examination of the left-middle field reveals decreased breath sounds. Examination of the right-lower field reveals decreased breath sounds. Examination of the left-lower field reveals decreased breath sounds. Decreased breath sounds present. No wheezing, rhonchi or rales.  ?Chest:  ?   Chest wall: No  tenderness.  ?Musculoskeletal:     ?   General: Normal range of motion.  ?   Cervical back: Normal range of motion and neck supple. Normal range of motion.  ?Lymphadenopathy:  ?   Cervical: No cervical adenopathy.  ?Skin: ?

## 2021-04-24 NOTE — ED Triage Notes (Signed)
Pt c/o continuous cough that has not improved since last week. Patient also states her throat feels scratchy. ?

## 2021-04-26 DIAGNOSIS — J019 Acute sinusitis, unspecified: Secondary | ICD-10-CM | POA: Diagnosis not present

## 2021-04-26 DIAGNOSIS — R0600 Dyspnea, unspecified: Secondary | ICD-10-CM | POA: Diagnosis not present

## 2021-04-26 DIAGNOSIS — J209 Acute bronchitis, unspecified: Secondary | ICD-10-CM | POA: Diagnosis not present

## 2021-04-26 DIAGNOSIS — R059 Cough, unspecified: Secondary | ICD-10-CM | POA: Diagnosis not present

## 2021-05-04 DIAGNOSIS — R053 Chronic cough: Secondary | ICD-10-CM | POA: Diagnosis not present

## 2021-05-09 ENCOUNTER — Ambulatory Visit: Payer: BC Managed Care – PPO | Admitting: Family Medicine

## 2021-05-28 ENCOUNTER — Telehealth: Payer: Self-pay

## 2021-05-28 NOTE — Telephone Encounter (Signed)
Called patient, LMVM inquiring if she has seen a PT. If not, we can set up a referral. Or, we can go ahead and send notes to her insurance company. Call back and advise.Marland Kitchen ?

## 2021-05-29 ENCOUNTER — Ambulatory Visit: Payer: BC Managed Care – PPO | Admitting: Family Medicine

## 2021-06-03 ENCOUNTER — Telehealth: Payer: Self-pay

## 2021-06-03 NOTE — Telephone Encounter (Signed)
Patient would like to be set up with PT. [facility] is fine. ?

## 2021-06-08 DIAGNOSIS — K219 Gastro-esophageal reflux disease without esophagitis: Secondary | ICD-10-CM | POA: Diagnosis not present

## 2021-06-08 DIAGNOSIS — L0291 Cutaneous abscess, unspecified: Secondary | ICD-10-CM | POA: Diagnosis not present

## 2021-06-08 DIAGNOSIS — E049 Nontoxic goiter, unspecified: Secondary | ICD-10-CM | POA: Diagnosis not present

## 2021-06-08 DIAGNOSIS — R0982 Postnasal drip: Secondary | ICD-10-CM | POA: Diagnosis not present

## 2021-06-11 ENCOUNTER — Other Ambulatory Visit: Payer: Self-pay | Admitting: Family Medicine

## 2021-06-11 DIAGNOSIS — E049 Nontoxic goiter, unspecified: Secondary | ICD-10-CM

## 2021-06-12 ENCOUNTER — Ambulatory Visit (INDEPENDENT_AMBULATORY_CARE_PROVIDER_SITE_OTHER): Payer: BC Managed Care – PPO | Admitting: Adult Health

## 2021-06-12 ENCOUNTER — Encounter (INDEPENDENT_AMBULATORY_CARE_PROVIDER_SITE_OTHER): Payer: Self-pay | Admitting: Adult Health

## 2021-06-12 VITALS — BP 99/63 | HR 85 | Temp 98.1°F | Ht 61.0 in | Wt 234.0 lb

## 2021-06-12 DIAGNOSIS — E559 Vitamin D deficiency, unspecified: Secondary | ICD-10-CM

## 2021-06-12 DIAGNOSIS — Z9189 Other specified personal risk factors, not elsewhere classified: Secondary | ICD-10-CM

## 2021-06-12 DIAGNOSIS — E669 Obesity, unspecified: Secondary | ICD-10-CM

## 2021-06-12 DIAGNOSIS — Z6841 Body Mass Index (BMI) 40.0 and over, adult: Secondary | ICD-10-CM | POA: Diagnosis not present

## 2021-06-12 DIAGNOSIS — R7303 Prediabetes: Secondary | ICD-10-CM

## 2021-06-13 LAB — COMPREHENSIVE METABOLIC PANEL
ALT: 15 IU/L (ref 0–32)
AST: 36 IU/L (ref 0–40)
Albumin/Globulin Ratio: 1 — ABNORMAL LOW (ref 1.2–2.2)
Albumin: 3.5 g/dL — ABNORMAL LOW (ref 3.9–5.0)
Alkaline Phosphatase: 53 IU/L (ref 44–121)
BUN/Creatinine Ratio: 8 — ABNORMAL LOW (ref 9–23)
BUN: 8 mg/dL (ref 6–20)
Bilirubin Total: <0.2 mg/dL (ref 0.0–1.2)
CO2: 22 mmol/L (ref 20–29)
Calcium: 9.1 mg/dL (ref 8.7–10.2)
Chloride: 109 mmol/L — ABNORMAL HIGH (ref 96–106)
Creatinine, Ser: 0.97 mg/dL (ref 0.57–1.00)
Globulin, Total: 3.4 g/dL (ref 1.5–4.5)
Glucose: 87 mg/dL (ref 70–99)
Potassium: 4.3 mmol/L (ref 3.5–5.2)
Sodium: 142 mmol/L (ref 134–144)
Total Protein: 6.9 g/dL (ref 6.0–8.5)
eGFR: 85 mL/min/{1.73_m2} (ref >59)

## 2021-06-13 LAB — VITAMIN D 25 HYDROXY (VIT D DEFICIENCY, FRACTURES): Vit D, 25-Hydroxy: 27.5 ng/mL — ABNORMAL LOW (ref 30.0–100.0)

## 2021-06-13 LAB — HEMOGLOBIN A1C
Est. average glucose Bld gHb Est-mCnc: 120 mg/dL
Hgb A1c MFr Bld: 5.8 % — ABNORMAL HIGH (ref 4.8–5.6)

## 2021-06-13 LAB — INSULIN, RANDOM: INSULIN: 35.8 u[IU]/mL — ABNORMAL HIGH (ref 2.6–24.9)

## 2021-06-20 ENCOUNTER — Other Ambulatory Visit: Payer: Self-pay

## 2021-06-20 DIAGNOSIS — N62 Hypertrophy of breast: Secondary | ICD-10-CM

## 2021-06-20 DIAGNOSIS — G8929 Other chronic pain: Secondary | ICD-10-CM

## 2021-06-23 NOTE — Progress Notes (Signed)
? ? ? ?Chief Complaint:  ? ?OBESITY ?Peggy Garza is here to discuss her progress with her obesity treatment plan along with follow-up of her obesity related diagnoses. Peggy Garza is on the Category 3 Plan and states she is following her eating plan approximately 0% of the time. Peggy Garza states she is not exercising. ? ?Today's visit was #: 13 ?Starting weight: 249 ?Starting date: 03/06/2020 ?Today's weight: 234 ?Today's date: 06/12/2021 ?Total lbs lost to date: 67 ?Total lbs lost since last in-office visit: 0 ? ?Interim History:  ?Peggy Garza had her last visit with [facility] on 12/17/2020.   ?She was previously on Category 3 meal plan.   ?She endorses low caloric intake since the fall of 2022.   ?He breakfast is cereal, she skips lunch and for dinner she will have chicken or shrimp from the air fryer.   ? ?Subjective:  ? ?1. Prediabetes ?Discussed labs with patient from 12/17/20 today; blood glucose 86, A1C 5.9 and Insulin level 24.8 ?Peggy Garza was on Victozia 1.2 mg daily.  She stopped this in September 2022 due to her "throat closing up". ? ?2. Vitamin D deficiency ?Discussed labs with patient from 12/17/20; Vitamin d level 44.6.  She has been off of her Vit D supplement for months. ? ?3. At risk for osteoporosis ?Peggy Garza is at higher risk of osteopenia and osteoporosis due to Vitamin D deficiency and obesity. ? ? ?Assessment/Plan:  ? ?1. Prediabetes ?[facility] will resume Category 3 meal plan and have labs checked. ?Due to reaction to Victoza do not recommend re-starting GLP-1 therapy. ? ?- Comprehensive metabolic panel ?- Hemoglobin A1c ?- Insulin, random ? ?2. Vitamin D deficiency ?Will check Vitamin D level today and refill her Vitamin D, Ergocalciferol 50,000 units ? ?- VITAMIN D 25 Hydroxy (Vit-D Deficiency, Fractures) ? ?3. At risk for osteoporosis ?We will continue to monitor. Orders and follow up as documented in patient record. ? ?Counseling ?Osteoporosis happens when your bones get thin and weak. This can cause your bones to break  (fracture) more easily.  ?Exercise is very important to keep bones strong. Focus on strength training (lifting weights) and exercises that make your muscles work to hold your body weight up (weight-bearing exercises). These include tai chi, yoga, and walking.  ?Limit alcohol intake to no more than 1 drink a day for nonpregnant women and 2 drinks a day for men. One drink equals 12 oz of beer, 5 oz of wine, or 1? oz of hard liquor. ?Do not use any products that have nicotine or tobacco in them.  ?Preventing falls ?Use tools to help you move around (mobility aids) as needed. These include canes, walkers, scooters, and crutches. ?Keep rooms well-lit and free of clutter. ?Wear shoes that fit you well and support your feet. ?Eat plenty of calcium and Vitamin D as these nutrients are good for your bones.  ?  ? ?4. Obesity, current BMI 44.4 ? ? ?Peggy Garza is currently in the action stage of change. As such, her goal is to continue with weight loss efforts. She has agreed to the Category 3 Plan.  ? ?Exercise goals: As is ? ?Behavioral modification strategies: increasing lean protein intake, decreasing simple carbohydrates, meal planning and cooking strategies, keeping healthy foods in the home, and planning for success. ? ?Peggy Garza has agreed to follow-up with our clinic in 3-4 weeks. She was informed of the importance of frequent follow-up visits to maximize her success with intensive lifestyle modifications for her multiple health conditions.  ? ?Peggy Garza was informed we would  discuss her lab results at her next visit unless there is a critical issue that needs to be addressed sooner. Peggy Garza agreed to keep her next visit at the agreed upon time to discuss these results. ? ?Objective:  ? ?Blood pressure 99/63, pulse 85, temperature 98.1 ?F (36.7 ?C), height 5' 1"  (1.549 m), weight 234 lb (106.1 kg), SpO2 100 %. ?Body mass index is 44.21 kg/m?. ? ?General: Cooperative, alert, well developed, in no acute distress. ?HEENT:  Conjunctivae and lids unremarkable. ?Cardiovascular: Regular rhythm.  ?Lungs: Normal work of breathing. ?Neurologic: No focal deficits.  ? ?Lab Results  ?Component Value Date  ? CREATININE 0.97 06/12/2021  ? BUN 8 06/12/2021  ? NA 142 06/12/2021  ? K 4.3 06/12/2021  ? CL 109 (H) 06/12/2021  ? CO2 22 06/12/2021  ? ?Lab Results  ?Component Value Date  ? ALT 15 06/12/2021  ? AST 36 06/12/2021  ? ALKPHOS 53 06/12/2021  ? BILITOT <0.2 06/12/2021  ? ?Lab Results  ?Component Value Date  ? HGBA1C 5.8 (H) 06/12/2021  ? HGBA1C 5.5 12/17/2020  ? HGBA1C 5.9 (H) 08/23/2020  ? HGBA1C 6.0 (H) 03/06/2020  ? HGBA1C 5.7 (H) 03/28/2019  ? ?Lab Results  ?Component Value Date  ? INSULIN 35.8 (H) 06/12/2021  ? INSULIN 24.8 12/17/2020  ? INSULIN 58.5 (H) 08/23/2020  ? INSULIN 28.7 (H) 03/06/2020  ? ?Lab Results  ?Component Value Date  ? TSH 1.790 10/23/2020  ? ?Lab Results  ?Component Value Date  ? CHOL 150 03/06/2020  ? HDL 43 03/06/2020  ? [place] 88 03/06/2020  ? TRIG 103 03/06/2020  ? ?Lab Results  ?Component Value Date  ? VD25OH 27.5 (L) 06/12/2021  ? VD25OH 44.6 12/17/2020  ? VD25OH 29.6 (L) 08/23/2020  ? ?Lab Results  ?Component Value Date  ? WBC 6.1 03/06/2020  ? HGB 11.3 03/06/2020  ? HCT 36.8 03/06/2020  ? MCV 82 03/06/2020  ? PLT 283 03/06/2020  ? ?No results found for: IRON, TIBC, FERRITIN ? ? ?Attestation Statements:  ? ?Reviewed by clinician on day of visit: allergies, medications, problem list, medical history, surgical history, family history, social history, and previous encounter notes. ? ?I, Althea Charon, am acting as Location manager for Mina Marble, NP. ? ?I have reviewed the above documentation for accuracy and completeness, and I agree with the above. -  Imari Sivertsen d. Shahrukh Pasch, NP-C ? ?

## 2021-06-28 ENCOUNTER — Ambulatory Visit
Admission: RE | Admit: 2021-06-28 | Discharge: 2021-06-28 | Disposition: A | Discharge status: Home / Self Care | Payer: BC Managed Care – PPO | Source: Ambulatory Visit | Attending: Family Medicine | Admitting: Family Medicine

## 2021-06-28 DIAGNOSIS — E049 Nontoxic goiter, unspecified: Secondary | ICD-10-CM

## 2021-07-01 ENCOUNTER — Other Ambulatory Visit: Payer: BC Managed Care – PPO

## 2021-07-03 ENCOUNTER — Other Ambulatory Visit: Payer: Self-pay | Admitting: Neurology

## 2021-07-10 DIAGNOSIS — N898 Other specified noninflammatory disorders of vagina: Secondary | ICD-10-CM | POA: Diagnosis not present

## 2021-07-10 DIAGNOSIS — Z113 Encounter for screening for infections with a predominantly sexual mode of transmission: Secondary | ICD-10-CM | POA: Diagnosis not present

## 2021-07-17 ENCOUNTER — Encounter (INDEPENDENT_AMBULATORY_CARE_PROVIDER_SITE_OTHER): Payer: Self-pay | Admitting: Adult Health

## 2021-07-17 ENCOUNTER — Ambulatory Visit (INDEPENDENT_AMBULATORY_CARE_PROVIDER_SITE_OTHER): Payer: BC Managed Care – PPO | Admitting: Adult Health

## 2021-07-17 VITALS — BP 91/58 | HR 84 | Temp 98.2°F | Ht 61.0 in | Wt 234.0 lb

## 2021-07-17 DIAGNOSIS — Z6841 Body Mass Index (BMI) 40.0 and over, adult: Secondary | ICD-10-CM

## 2021-07-17 DIAGNOSIS — E669 Obesity, unspecified: Secondary | ICD-10-CM | POA: Diagnosis not present

## 2021-07-17 DIAGNOSIS — G43709 Chronic migraine without aura, not intractable, without status migrainosus: Secondary | ICD-10-CM | POA: Diagnosis not present

## 2021-07-17 DIAGNOSIS — Z9189 Other specified personal risk factors, not elsewhere classified: Secondary | ICD-10-CM

## 2021-07-17 DIAGNOSIS — R7303 Prediabetes: Secondary | ICD-10-CM

## 2021-07-17 DIAGNOSIS — E559 Vitamin D deficiency, unspecified: Secondary | ICD-10-CM | POA: Diagnosis not present

## 2021-07-17 MED ORDER — VITAMIN D (ERGOCALCIFEROL) 1.25 MG (50000 UNIT) PO CAPS: 50000.0000 [IU] | ORAL_CAPSULE | ORAL | 0 refills | Status: DC

## 2021-07-22 NOTE — Progress Notes (Signed)
Chief Complaint:   OBESITY Peggy Garza is here to discuss her progress with her obesity treatment plan along with follow-up of her obesity related diagnoses. Peggy Garza is on the Category 3 Plan and states she is following her eating plan approximately 0% of the time. Peggy Garza states she is walking for 40 minutes 5 times per week.  Today's visit was #: 14 Starting weight: 249 lbs Starting date: 03/06/2020 Today's weight: 234 lbs Today's date: 07/17/2021 Total lbs lost to date: 15 Total lbs lost since last in-office visit: 0  Interim History:  She reports 0% compliance on category 3 meal plan. She skips both breakfast and lunch. Dinner will often be a protein either-chicken or shrimp with a rice side dish.  Subjective:   1. Vitamin D deficiency Vitamin D level is worsening.   06/12/21- Vit D level-  27.5. 12/17/20- Vit D Level- 44.6 I discussed labs with the patient today.  2. Prediabetes Metformin plain and extended formation both caused diarrhea.   06/12/21- A1c 5.8, BG 87 I discussed labs with the patient today  3. Chronic migraine without aura without status migrainosus, not intractable Taking nightly Topamax 50 mg, been on it for approximately 4 months.   She notes 4-5 headaches monthly prior to Topamax.   With nightly Topamax 50mg , she reports only  0-1 headache per month.   Managed by Dr. of neurology.  4. At risk for deficient intake of food The patient is at a higher than average risk of deficient intake of food due to limited food intake.  Assessment/Plan:   1. Vitamin D deficiency Restart Ergocalciferol 50,000 units once weekly and we will refill for 2 months.  - Vitamin D, Ergocalciferol, (DRISDOL) 1.25 MG (50000 UNIT) CAPS capsule; Take 1 capsule (50,000 Units total) by mouth every 7 (seven) days.  Dispense: 8 capsule; Refill: 0  2. Prediabetes Start journaling plan with 95 grams of protein per day.  3. Chronic migraine without aura without status migrainosus,  not intractable Continue taking nightly Topamax 50 mg.  4. At risk for deficient intake of food Peggy Garza was given approximately 15 minutes of deficient intake of food prevention counseling today. Peggy Garza is at risk for eating too few calories based on current food recall. She was encouraged to focus on meeting caloric and protein goals according to her recommended meal plan.  5. Obesity, current BMI 44.2 Peggy Garza is currently in the action stage of change. As such, her goal is to continue with weight loss efforts. She has agreed to keeping a food journal and adhering to recommended goals of 1500 calories and 95 grams of protein daily.   To increase overall food intake: Strive to consume: Breakfast-yogurt Lunch- 4 oz protein sandwich  Dinner- 8 oz serving of protein and at least 1 cup of vegetables.   Exercise goals: As is.  Behavioral modification strategies: increasing lean protein intake, decreasing simple carbohydrates, meal planning and cooking strategies, keeping healthy foods in the home, and planning for success.  Peggy Garza has agreed to follow-up with our clinic in 4 to 5 weeks. She was informed of the importance of frequent follow-up visits to maximize her success with intensive lifestyle modifications for her multiple health conditions.   Objective:   Blood pressure (!) 91/58, pulse 84, temperature 98.2 F (36.8 C), height 5\' 1"  (1.549 m), weight 234 lb (106.1 kg), SpO2 100 %. Body mass index is 44.21 kg/m.  General: Cooperative, alert, well developed, in no acute distress. HEENT: Conjunctivae and lids  unremarkable. Cardiovascular: Regular rhythm.  Lungs: Normal work of breathing. Neurologic: No focal deficits.   Lab Results  Component Value Date   CREATININE 0.97 06/12/2021   BUN 8 06/12/2021   NA 142 06/12/2021   K 4.3 06/12/2021   CL 109 (H) 06/12/2021   CO2 22 06/12/2021   Lab Results  Component Value Date   ALT 15 06/12/2021   AST 36 06/12/2021   ALKPHOS 53  06/12/2021   BILITOT <0.2 06/12/2021   Lab Results  Component Value Date   HGBA1C 5.8 (H) 06/12/2021   HGBA1C 5.5 12/17/2020   HGBA1C 5.9 (H) 08/23/2020   HGBA1C 6.0 (H) 03/06/2020   HGBA1C 5.7 (H) 03/28/2019   Lab Results  Component Value Date   INSULIN 35.8 (H) 06/12/2021   INSULIN 24.8 12/17/2020   INSULIN 58.5 (H) 08/23/2020   INSULIN 28.7 (H) 03/06/2020   Lab Results  Component Value Date   TSH 1.790 10/23/2020   Lab Results  Component Value Date   CHOL 150 03/06/2020   HDL 43 03/06/2020   LDLCALC 88 03/06/2020   TRIG 103 03/06/2020   Lab Results  Component Value Date   VD25OH 27.5 (L) 06/12/2021   VD25OH 44.6 12/17/2020   VD25OH 29.6 (L) 08/23/2020   Lab Results  Component Value Date   WBC 6.1 03/06/2020   HGB 11.3 03/06/2020   HCT 36.8 03/06/2020   MCV 82 03/06/2020   PLT 283 03/06/2020   No results found for: "IRON", "TIBC", "FERRITIN"  Attestation Statements:   Reviewed by clinician on day of visit: allergies, medications, problem list, medical history, surgical history, family history, social history, and previous encounter notes.   Trude Mcburney, am acting as transcriptionist for William Hamburger, NP.  I have reviewed the above documentation for accuracy and completeness, and I agree with the above. -  Shantil Vallejo d. Odena Mcquaid, NP-C

## 2021-07-31 ENCOUNTER — Telehealth: Payer: Self-pay | Admitting: Plastic Surgery

## 2021-07-31 NOTE — Telephone Encounter (Signed)
Outbound call placed to request status of patient's PT/MWM. Left message on voicemail to return call.

## 2021-08-08 ENCOUNTER — Ambulatory Visit: Payer: BC Managed Care – PPO | Admitting: Family Medicine

## 2021-08-22 ENCOUNTER — Ambulatory Visit (INDEPENDENT_AMBULATORY_CARE_PROVIDER_SITE_OTHER): Payer: BC Managed Care – PPO | Admitting: Adult Health

## 2021-08-22 ENCOUNTER — Encounter (INDEPENDENT_AMBULATORY_CARE_PROVIDER_SITE_OTHER): Payer: Self-pay | Admitting: Adult Health

## 2021-08-22 ENCOUNTER — Ambulatory Visit (INDEPENDENT_AMBULATORY_CARE_PROVIDER_SITE_OTHER): Payer: BC Managed Care – PPO | Admitting: Family Medicine

## 2021-08-22 ENCOUNTER — Encounter: Payer: Self-pay | Admitting: Family Medicine

## 2021-08-22 VITALS — BP 99/66 | HR 85 | Temp 98.0°F | Ht 61.0 in | Wt 241.0 lb

## 2021-08-22 VITALS — BP 114/73 | HR 86 | Wt 241.0 lb

## 2021-08-22 DIAGNOSIS — Z6841 Body Mass Index (BMI) 40.0 and over, adult: Secondary | ICD-10-CM

## 2021-08-22 DIAGNOSIS — E282 Polycystic ovarian syndrome: Secondary | ICD-10-CM | POA: Diagnosis not present

## 2021-08-22 DIAGNOSIS — N6489 Other specified disorders of breast: Secondary | ICD-10-CM | POA: Diagnosis not present

## 2021-08-22 DIAGNOSIS — E669 Obesity, unspecified: Secondary | ICD-10-CM | POA: Diagnosis not present

## 2021-08-22 DIAGNOSIS — R7303 Prediabetes: Secondary | ICD-10-CM

## 2021-08-22 MED ORDER — SEMAGLUTIDE-WEIGHT MANAGEMENT 0.25 MG/0.5ML ~~LOC~~ SOAJ: 0.2500 mg | SUBCUTANEOUS | 0 refills | Status: DC

## 2021-08-22 MED ORDER — SEMAGLUTIDE-WEIGHT MANAGEMENT 0.5 MG/0.5ML ~~LOC~~ SOAJ: 0.5000 mg | SUBCUTANEOUS | 0 refills | Status: DC

## 2021-08-22 MED ORDER — SEMAGLUTIDE-WEIGHT MANAGEMENT 1 MG/0.5ML ~~LOC~~ SOAJ: 1.0000 mg | SUBCUTANEOUS | 0 refills | Status: DC

## 2021-08-22 NOTE — Progress Notes (Signed)
Patient presents for follow up from PCOS- irregular periods. Armandina Stammer, RN

## 2021-08-22 NOTE — Progress Notes (Signed)
   Subjective:    Patient ID: Peggy Garza, female    DOB: [birth year], 21 y.o.   MRN: [id number]  HPI  Patient seen for follow-up of PCOS.  Patient was prescribed Procardia 20 mg daily for 10 days, then stop.  The patient had 2 menstrual cycles following the cessation of the Procardia.  She had a normal interval between the cycles.  The patient has been trialed on metformin, which she did not tolerate due to GI side effects.  She has been on Victoza in the past.  She noted weight loss with this medication but had called in due to a possible reaction after her last dose.  She recalls the sensation as sore throat with hoarseness.  Nurse notes from the episode reported that she thought her throat was closing up.  Patient never experienced difficulty breathing and never sought emergency care.  She was taken off medication.  Patient reports that the symptoms were short-lived and that she remains uncertain of whether it was due to the medication or not.  Current goals of care is to get pregnant at some point.  She and her husband are currently actively trying to get pregnant without much success.  Additionally, the patient is requesting a hysterosalpingogram in order to evaluate for patency of her fallopian tubes.  Review of Systems     Objective:   Physical Exam Vitals reviewed.  Constitutional:      Appearance: Normal appearance.  Cardiovascular:     Rate and Rhythm: Normal rate and regular rhythm.     Pulses: Normal pulses.     Heart sounds: Normal heart sounds.  Skin:    Capillary Refill: Capillary refill takes less than 2 seconds.  Neurological:     General: No focal deficit present.     Mental Status: She is alert.  Psychiatric:        Mood and Affect: Mood normal.        Behavior: Behavior normal.        Thought Content: Thought content normal.        Assessment & Plan:  1. PCOS (polycystic ovarian syndrome) HSG ordered. Literature search done with case studies showing low  risk of cross-reactivity between Victoza and Wegovy.  Additionally, the patient reports different side effects to the Victoza than what was reported in the chart.  We discussed that a trial of Wegovy may be safe.  We can certainly start low dose and see how her symptoms are.  We discussed how to take the medication, side effects.  We discussed what to do if she does have an allergic reaction to the medication, including Benadryl and emergency care. - DG Hysterogram (HSG); Future  2. Prediabetes  3. Class 3 severe obesity with serious comorbidity and body mass index (BMI) of 45.0 to 49.9 in adult, unspecified obesity type (HCC)

## 2021-08-26 NOTE — Progress Notes (Unsigned)
Chief Complaint:   OBESITY Peggy Garza is here to discuss her progress with her obesity treatment plan along with follow-up of her obesity related diagnoses. Peggy Garza is on keeping a food journal and adhering to recommended goals of 1500 calories and 95 grams of protein daily and states she is following her eating plan approximately 0% of the time. Peggy Garza states she is walking for 60 minutes 4-5 times per week.  Today's visit was #: 15 Starting weight: 249 lbs Starting date: 03/06/2020 Today's weight: 241 lbs Today's date: 08/22/2021 Total lbs lost to date: 8 Total lbs lost since last in-office visit: 0  Interim History:  Peggy Garza is not following any structured meal plan.   She provided the following food recall that is typical of a day: Breakfast-2 packets of grits, 2 pieces of [place] bacon, 2 protein waffles.   Lunch-skip.   Dinner-sandwich, peanut butter sandwich.  Subjective:   1. Bilateral pendulous breasts Peggy Garza's initial appointment with plastic surgery was 03/18/2021.   Current bra size is 48 DDD. She is still considering bilateral breast reduction.  2. Pre-diabetes Peggy Garza is unable to tolerate any formulation of metformin (GI upset).   She is interested in a GLP-1 therapy.  Her great uncle has thyroid cancer, unsure of what type. She denies personal hx of pancreatitis.  Assessment/Plan:   1. Bilateral pendulous breasts Peggy Garza will continue with weight loss efforts.  She will follow-up with plasties as directed.  2. Pre-diabetes Peggy Garza will follow her category 3 meal plan, and increase regular exercise.   She is to inquire about what type of thyroid cancer her uncle has.  3. Obesity, current BMI 45.6 Peggy Garza is currently in the action stage of change. As such, her goal is to continue with weight loss efforts. She has agreed to the Category 3 Plan or keeping a food journal and adhering to recommended goals of 1500 calories and 95 grams of protein daily.   Category 3  with journaling  Exercise goals: As is.   Behavioral modification strategies: increasing lean protein intake, decreasing simple carbohydrates, meal planning and cooking strategies, keeping healthy foods in the home, and planning for success.  Peggy Garza has agreed to follow-up with our clinic in 3 to 4 weeks. She was informed of the importance of frequent follow-up visits to maximize her success with intensive lifestyle modifications for her multiple health conditions.   Objective:   Blood pressure 99/66, pulse 85, temperature 98 F (36.7 C), height 5\' 1"  (1.549 m), weight 241 lb (109.3 kg), SpO2 100 %. Body mass index is 45.54 kg/m.  General: Cooperative, alert, well developed, in no acute distress. HEENT: Conjunctivae and lids unremarkable. Cardiovascular: Regular rhythm.  Lungs: Normal work of breathing. Neurologic: No focal deficits.   Lab Results  Component Value Date   CREATININE 0.97 06/12/2021   BUN 8 06/12/2021   NA 142 06/12/2021   K 4.3 06/12/2021   CL 109 (H) 06/12/2021   CO2 22 06/12/2021   Lab Results  Component Value Date   ALT 15 06/12/2021   AST 36 06/12/2021   ALKPHOS 53 06/12/2021   BILITOT <0.2 06/12/2021   Lab Results  Component Value Date   HGBA1C 5.8 (H) 06/12/2021   HGBA1C 5.5 12/17/2020   HGBA1C 5.9 (H) 08/23/2020   HGBA1C 6.0 (H) 03/06/2020   HGBA1C 5.7 (H) 03/28/2019   Lab Results  Component Value Date   INSULIN 35.8 (H) 06/12/2021   INSULIN 24.8 12/17/2020   INSULIN 58.5 (H) 08/23/2020  INSULIN 28.7 (H) 03/06/2020   Lab Results  Component Value Date   TSH 1.790 10/23/2020   Lab Results  Component Value Date   CHOL 150 03/06/2020   HDL 43 03/06/2020   LDLCALC 88 03/06/2020   TRIG 103 03/06/2020   Lab Results  Component Value Date   VD25OH 27.5 (L) 06/12/2021   VD25OH 44.6 12/17/2020   VD25OH 29.6 (L) 08/23/2020   Lab Results  Component Value Date   WBC 6.1 03/06/2020   HGB 11.3 03/06/2020   HCT 36.8 03/06/2020   MCV 82  03/06/2020   PLT 283 03/06/2020   No results found for: "IRON", "TIBC", "FERRITIN"  Attestation Statements:   Reviewed by clinician on day of visit: allergies, medications, problem list, medical history, surgical history, family history, social history, and previous encounter notes.  Time spent on visit including pre-visit chart review and post-visit care and charting was 29 minutes.   Trude Mcburney, am acting as transcriptionist for William Hamburger, NP.  I have reviewed the above documentation for accuracy and completeness, and I agree with the above. - Katy d. Danford, NP-C

## 2021-08-27 ENCOUNTER — Encounter: Payer: Self-pay | Admitting: Family Medicine

## 2021-08-27 ENCOUNTER — Telehealth: Payer: Self-pay | Admitting: Neurology

## 2021-08-27 DIAGNOSIS — N6489 Other specified disorders of breast: Secondary | ICD-10-CM | POA: Insufficient documentation

## 2021-08-27 NOTE — Telephone Encounter (Signed)
Pt said she wants to come pick up home sleep study equipment. Pt  would like a call from nurse

## 2021-09-02 ENCOUNTER — Telehealth: Payer: Self-pay | Admitting: Neurology

## 2021-09-02 NOTE — Telephone Encounter (Signed)
BCBS Berkley Harvey: [id number] (exp. 09/02/21 to 10/31/21)  m[facility] healthy blue pending uploaded notes

## 2021-09-02 NOTE — Telephone Encounter (Signed)
BCBS auth: [id number] (exp. 09/02/21 to 10/31/21)  mcd healthy blue pending uploaded notes 

## 2021-09-03 ENCOUNTER — Ambulatory Visit: Payer: BC Managed Care – PPO | Attending: Plastic Surgery

## 2021-09-03 ENCOUNTER — Other Ambulatory Visit: Payer: Self-pay

## 2021-09-03 DIAGNOSIS — G8929 Other chronic pain: Secondary | ICD-10-CM | POA: Diagnosis not present

## 2021-09-03 DIAGNOSIS — R252 Cramp and spasm: Secondary | ICD-10-CM | POA: Insufficient documentation

## 2021-09-03 DIAGNOSIS — M546 Pain in thoracic spine: Secondary | ICD-10-CM | POA: Diagnosis not present

## 2021-09-03 DIAGNOSIS — N62 Hypertrophy of breast: Secondary | ICD-10-CM | POA: Insufficient documentation

## 2021-09-03 NOTE — Therapy (Signed)
OUTPATIENT PHYSICAL THERAPY CERVICAL EVALUATION   Patient Name: Peggy Garza MRN: [id number] DOB:12-May-2000, 21 y.o., female Today's Date: 09/03/2021   PT End of Session - 09/03/21 1518     Visit Number 1    Number of Visits 6    Date for PT Re-Evaluation 10/25/21    Authorization Type BCBS COMM PPO; [place] MEDICAID HEALTHY BLUE    PT Start Time [id number]    PT Stop Time 0803    PT Time Calculation (min) 45 min    Activity Tolerance Patient tolerated treatment well    Behavior During Therapy WFL for tasks assessed/performed             Past Medical History:  Diagnosis Date   Genital herpes    Headache    Other fatigue    PCOS (polycystic ovarian syndrome)    Prediabetes    Shortness of breath on exertion    Past Surgical History:  Procedure Laterality Date   TONSILLECTOMY     Patient Active Problem List   Diagnosis Date Noted   Bilateral pendulous breasts 08/27/2021   Chronic migraine without aura without status migrainosus, not intractable 10/23/2020   Hyperglycemia 09/02/2020   Class 3 severe obesity with serious comorbidity and body mass index (BMI) of 45.0 to 49.9 in adult (HCC) 09/02/2020   Vitamin D deficiency 06/22/2019   Prediabetes 06/22/2019   PCOS (polycystic ovarian syndrome) 06/15/2019   Genital herpes simplex 03/01/2019   Gallstone 03/01/2019   PTSD (post-traumatic stress disorder) 08/21/2017   MDD (major depressive disorder), recurrent episode, mild (HCC) 08/21/2017    PCP: Hilton Cork, PA-C  REFERRING PROVIDER: Janne Napoleon, MD  REFERRING DIAG:  [phone] (ICD-10-CM) - Chronic bilateral thoracic back pain  N62 (ICD-10-CM) - Breast hypertrophy    THERAPY DIAG:  Pain in thoracic spine  Cramp and spasm  Rationale for Evaluation and Treatment Rehabilitation  ONSET DATE: 5 years  SUBJECTIVE:                                                                                                                                                                                                          SUBJECTIVE STATEMENT: Mid-back pain, R>L, which bothers her most significantly at night and impacts her work . Sitting and working on a computer is limited to 30 mins.  PERTINENT HISTORY:  Migraines, obesity  PAIN:  Are you having pain? Yes: NPRS scale: 5/10 Pain location: Mid back Pain description: Constant, throb Aggravating factors: Worse at night, walking for exercise, lifting Relieving factors: Heating pad 5-9/10  PRECAUTIONS:  None  WEIGHT BEARING RESTRICTIONS No  FALLS:  Has patient fallen in last 6 months? No  LIVING ENVIRONMENT: Lives with: lives with their family and lives alone Lives in: House/apartment No issue with accessing or mobility within home  OCCUPATION: Office work, Animator  PLOF: Independent  PATIENT GOALS minimize pain  OBJECTIVE:   DIAGNOSTIC FINDINGS:  None relevant  PATIENT SURVEYS:  NA   COGNITION: Overall cognitive status: Within functional limits for tasks assessed   SENSATION: WFL  POSTURE: rounded shoulders, forward head, increased lumbar lordosis, increased thoracic kyphosis, and anterior pelvic tilt, CT juction step off  PALPATION:  TTP to the mid thoracic paraspinal areawith increased muscle tension noted  CERVICAL ROM:   Active ROM A/PROM (deg) eval  Flexion 45 no pain  Extension 30 pressure pain lower neck  Right lateral flexion 35 pulling pain L  Left lateral flexion 42 pulling pain R  Right rotation 62 no pain  Left rotation 75 no pain   (Blank rows = not tested)  UPPER EXTREMITY ROM:   UE ROMs were WNLs   UPPER EXTREMITY MMT:  UE MMT was WNLs and equal R to L   CERVICAL SPECIAL TESTS:  Sprulling's Test: pressure R cervical c R rotation and ext, pulling R upper shoulder c L rotation and ext   TODAY'S TREATMENT:  [facility] Adult PT Treatment:                                                DATE: 09/03/21 Therapeutic Exercise: Cervical Retraction  5  reps 5" Scapular Retraction  5 reps 5" Doorway Pec Stretch at 90 Degrees Abduction  3 reps 30"  Self Care: Tennis ball massage on wall  PATIENT EDUCATION:  Education details: Eval findings, POC, HEP, massage c tennis ball Person educated: Patient Education method: Explanation, Demonstration, Tactile cues, Verbal cues, and Handouts Education comprehension: verbalized understanding, returned demonstration, verbal cues required, and tactile cues required   HOME EXERCISE PROGRAM: Access Code: [email or web address] URL: [email or web address] Date: 09/03/2021 Prepared by: Joellyn Rued  Exercises - Seated Passive Cervical Retraction  - 6 x daily - 7 x weekly - 1 sets - 3-5 reps - 5 hold - Seated Scapular Retraction  - 6 x daily - 7 x weekly - 1 sets - 3-5 reps - 5 hold - Doorway Pec Stretch at 90 Degrees Abduction  - 2 x daily - 7 x weekly - 1 sets - 2-3 reps - 30 hold  ASSESSMENT:  CLINICAL IMPRESSION: Patient is a 21 y.o. F who was seen today for physical therapy evaluation and treatment for Breast hypertrophy; Chronic bilateral thoracic back pain.    OBJECTIVE IMPAIRMENTS decreased activity tolerance, decreased ROM, decreased strength, increased muscle spasms, postural dysfunction, and obesity.   ACTIVITY LIMITATIONS sitting and sleeping  PARTICIPATION LIMITATIONS: driving and occupation  PERSONAL FACTORS Fitness, Past/current experiences, Time since onset of injury/illness/exacerbation, and 1-2 comorbidities: migraines, obesity  are also affecting patient's functional outcome.   REHAB POTENTIAL: Good  CLINICAL DECISION MAKING: Stable/uncomplicated  EVALUATION COMPLEXITY: Low   GOALS:  SHORT TERM GOALS+ LTGs    LONG TERM GOALS: Target date: 10/25/21  Pt will be able to demonstrated proper sitting posture Baseline: Forward head and rounded shoulder Goal status: INITIAL  2.  Pt will demonstrate improved cervcial ROM by at least 5d for all motions for improved neck  function  Baseline: See flow sheet Goal status: INITIAL  3.  Pt will report a decrease in pain to 6/10 or less for improved QOL Baseline: 5-9/10 Goal status: INITIAL  4.  Pt will report improved tolerance for working at a computer for 30 mins Baseline: Increased pain with 30 mins Goal status: INITIAL  5.  Pt will be ind in a final HEP to maintain achieved LOF Baseline: started on eval Goal status: INITIAL   PLAN: PT FREQUENCY: 1x/week  PT DURATION: 5 weeks  PLANNED INTERVENTIONS: Therapeutic exercises, Therapeutic activity, Patient/Family education, Self Care, Dry Needling, Electrical stimulation, Cryotherapy, Moist heat, Taping, Traction, Ionotophoresis 4mg /ml Dexamethasone, Manual therapy, and Re-evaluation  PLAN FOR NEXT SESSION: Review HEP, progress therex as indicated, education for computer set up for improved work posture, use of modalities, manual care, and TPDN as indicated  MS, PT 09/03/21 3:46 PM

## 2021-09-11 NOTE — Telephone Encounter (Signed)
09/02/21 BCBS auth: [id number] (exp. 09/02/21 to 10/31/21)  mcd healthy blue no auth req EE 09/10/21 left VM to schedule [place] 

## 2021-09-11 NOTE — Telephone Encounter (Signed)
09/02/21 BCBS Berkley Harvey: [id number] (exp. 09/02/21 to 10/31/21)  mcd healthy blue no auth req EE 09/10/21 left VM to schedule [place]

## 2021-09-12 ENCOUNTER — Encounter: Payer: Self-pay | Admitting: Physical Therapy

## 2021-09-12 ENCOUNTER — Ambulatory Visit: Payer: BC Managed Care – PPO | Attending: Plastic Surgery | Admitting: Physical Therapy

## 2021-09-12 DIAGNOSIS — M546 Pain in thoracic spine: Secondary | ICD-10-CM | POA: Diagnosis not present

## 2021-09-12 DIAGNOSIS — R252 Cramp and spasm: Secondary | ICD-10-CM | POA: Diagnosis not present

## 2021-09-12 NOTE — Therapy (Signed)
OUTPATIENT PHYSICAL THERAPY TREATMENT NOTE   Patient Name: Peggy Garza MRN: [id number] DOB:2000-10-29, 21 y.o., female Today's Date: 09/12/2021    PCP: Hilton Cork, PA-C   REFERRING PROVIDER: Janne Napoleon, MD  END OF SESSION:   PT End of Session - 09/12/21 0805     Visit Number 2    Number of Visits 6    Date for PT Re-Evaluation 10/25/21    Authorization Type BCBS COMM PPO; [place] MEDICAID HEALTHY BLUE    PT Start Time 0800    PT Stop Time 0838    PT Time Calculation (min) 38 min             Past Medical History:  Diagnosis Date   Genital herpes    Headache    Other fatigue    PCOS (polycystic ovarian syndrome)    Prediabetes    Shortness of breath on exertion    Past Surgical History:  Procedure Laterality Date   TONSILLECTOMY     Patient Active Problem List   Diagnosis Date Noted   Bilateral pendulous breasts 08/27/2021   Chronic migraine without aura without status migrainosus, not intractable 10/23/2020   Hyperglycemia 09/02/2020   Class 3 severe obesity with serious comorbidity and body mass index (BMI) of 45.0 to 49.9 in adult (HCC) 09/02/2020   Vitamin D deficiency 06/22/2019   Prediabetes 06/22/2019   PCOS (polycystic ovarian syndrome) 06/15/2019   Genital herpes simplex 03/01/2019   Gallstone 03/01/2019   PTSD (post-traumatic stress disorder) 08/21/2017   MDD (major depressive disorder), recurrent episode, mild (HCC) 08/21/2017    REFERRING DIAG:  J50.0,X38.18 (ICD-10-CM) - Chronic bilateral thoracic back pain  N62 (ICD-10-CM) - Breast hypertrophy    THERAPY DIAG:  Pain in thoracic spine  Cramp and spasm  Rationale for Evaluation and Treatment Rehabilitation  PERTINENT HISTORY: Migraines, obesity   PRECAUTIONS: none  SUBJECTIVE: I like using the tennis Ball. My pain is left sided near my shoulder blade, 5/10.   PAIN:  Are you having pain? Yes: NPRS scale: 5/10 Pain location: Mid back Pain description: Constant,  throb Aggravating factors: Worse at night, walking for exercise, lifting Relieving factors: Heating pad 5-9/10   OBJECTIVE: (objective measures completed at initial evaluation unless otherwise dated)   DIAGNOSTIC FINDINGS:  None relevant   PATIENT SURVEYS:  NA     COGNITION: Overall cognitive status: Within functional limits for tasks assessed     SENSATION: WFL   POSTURE: rounded shoulders, forward head, increased lumbar lordosis, increased thoracic kyphosis, and anterior pelvic tilt, CT juction step off   PALPATION:            TTP to the mid thoracic paraspinal areawith increased muscle tension noted   CERVICAL ROM:    Active ROM A/PROM (deg) eval  Flexion 45 no pain  Extension 30 pressure pain lower neck  Right lateral flexion 35 pulling pain L  Left lateral flexion 42 pulling pain R  Right rotation 62 no pain  Left rotation 75 no pain   (Blank rows = not tested)   UPPER EXTREMITY ROM:                       UE ROMs were WNLs     UPPER EXTREMITY MMT:           UE MMT was WNLs and equal R to L     CERVICAL SPECIAL TESTS:  Sprulling's Test: pressure R cervical c R rotation and ext, pulling R  upper shoulder c L rotation and ext     TODAY'S TREATMENT:  [facility] Adult PT Treatment:                                                DATE: 09/12/21 Therapeutic Exercise: UBE Level 1 2 min each way Standing Star Pattern Red band x 10 Upper trap and levator stretches Green Band Row 10 x 2 Cervical Retraction  10 reps 5" Scapular Retraction  5 reps 5" Doorway Pec Stretch at 90 Degrees Abduction  3 reps 30" Soft Foam roller thoracic extension x 10- hands behind head Open books  x 10 each    [facility] Adult PT Treatment:                                                DATE: 09/03/21 Therapeutic Exercise: Cervical Retraction  5 reps 5" Scapular Retraction  5 reps 5" Doorway Pec Stretch at 90 Degrees Abduction  3 reps 30"   Self Care: Tennis ball massage on wall    PATIENT EDUCATION:  Education details: Eval findings, POC, HEP, massage c tennis ball Person educated: Patient Education method: Explanation, Demonstration, Tactile cues, Verbal cues, and Handouts Education comprehension: verbalized understanding, returned demonstration, verbal cues required, and tactile cues required     HOME EXERCISE PROGRAM: Access Code: [email or web address] URL: [email or web address] Date: 09/12/2021 Prepared by: Jannette Spanner  Exercises - Seated Passive Cervical Retraction  - 6 x daily - 7 x weekly - 1 sets - 3-5 reps - 5 hold - Seated Scapular Retraction  - 6 x daily - 7 x weekly - 1 sets - 3-5 reps - 5 hold - Doorway Pec Stretch at 90 Degrees Abduction  - 2 x daily - 7 x weekly - 1 sets - 2-3 reps - 30 hold - Alternating star pattern  - 1 x daily - 7 x weekly - 2 sets - 10 reps - Seated Upper Trapezius Stretch  - 1 x daily - 7 x weekly - 1 sets - 3 reps - 20 hold - Gentle Levator Scapulae Stretch  - 1 x daily - 7 x weekly - 1 sets - 3 reps - 20 hold   ASSESSMENT:   CLINICAL IMPRESSION: Patient is a 21 y.o. F who was seen today for physical therapy evaluation and treatment for Breast hypertrophy; Chronic bilateral thoracic back pain. She reports compliance with HEP and improvement in pain with use of tennis ball and scap retractions. Progressed therex to include thoracic flexibility and scapular stabilization. Updated HEP. Pt reported feeling much better after session.      OBJECTIVE IMPAIRMENTS decreased activity tolerance, decreased ROM, decreased strength, increased muscle spasms, postural dysfunction, and obesity.    ACTIVITY LIMITATIONS sitting and sleeping   PARTICIPATION LIMITATIONS: driving and occupation   PERSONAL FACTORS Fitness, Past/current experiences, Time since onset of injury/illness/exacerbation, and 1-2 comorbidities: migraines, obesity  are also affecting patient's functional outcome.    REHAB POTENTIAL: Good   CLINICAL DECISION  MAKING: Stable/uncomplicated   EVALUATION COMPLEXITY: Low     GOALS:   SHORT TERM GOALS+ LTGs     LONG TERM GOALS: Target date: 10/25/21   Pt will be able to demonstrated proper sitting posture Baseline: Forward head  and rounded shoulder Goal status: INITIAL   2.  Pt will demonstrate improved cervcial ROM by at least 5d for all motions for improved neck function Baseline: See flow sheet Goal status: INITIAL   3.  Pt will report a decrease in pain to 6/10 or less for improved QOL Baseline: 5-9/10 Goal status: INITIAL   4.  Pt will report improved tolerance for working at a computer for 30 mins Baseline: Increased pain with 30 mins Goal status: INITIAL   5.  Pt will be ind in a final HEP to maintain achieved LOF Baseline: started on eval Goal status: INITIAL     PLAN: PT FREQUENCY: 1x/week   PT DURATION: 5 weeks   PLANNED INTERVENTIONS: Therapeutic exercises, Therapeutic activity, Patient/Family education, Self Care, Dry Needling, Electrical stimulation, Cryotherapy, Moist heat, Taping, Traction, Ionotophoresis 4mg /ml Dexamethasone, Manual therapy, and Re-evaluation   PLAN FOR NEXT SESSION: Review HEP, progress therex as indicated, education for computer set up for improved work posture, use of modalities, manual care, and TPDN as indicated    , PTA 09/12/21 8:31 AM Phone: [phone] Fax: [phone]

## 2021-09-17 NOTE — Telephone Encounter (Signed)
Patient is scheduled at [facility] for 10/08/21 at 8:30 AM. Mailed packet to the patient.

## 2021-09-18 ENCOUNTER — Encounter (INDEPENDENT_AMBULATORY_CARE_PROVIDER_SITE_OTHER): Payer: Self-pay

## 2021-09-19 ENCOUNTER — Ambulatory Visit: Payer: BC Managed Care – PPO

## 2021-09-23 ENCOUNTER — Ambulatory Visit (INDEPENDENT_AMBULATORY_CARE_PROVIDER_SITE_OTHER): Payer: BC Managed Care – PPO | Admitting: Adult Health

## 2021-09-25 ENCOUNTER — Ambulatory Visit
Admission: RE | Admit: 2021-09-25 | Discharge: 2021-09-25 | Disposition: A | Discharge status: Home / Self Care | Payer: BC Managed Care – PPO | Source: Ambulatory Visit | Attending: Internal Medicine | Admitting: Internal Medicine

## 2021-09-25 VITALS — BP 124/86 | HR 85 | Temp 98.7°F | Resp 17

## 2021-09-25 DIAGNOSIS — J029 Acute pharyngitis, unspecified: Secondary | ICD-10-CM | POA: Diagnosis not present

## 2021-09-25 DIAGNOSIS — Z20822 Contact with and (suspected) exposure to covid-19: Secondary | ICD-10-CM | POA: Insufficient documentation

## 2021-09-25 DIAGNOSIS — J069 Acute upper respiratory infection, unspecified: Secondary | ICD-10-CM | POA: Insufficient documentation

## 2021-09-25 DIAGNOSIS — R059 Cough, unspecified: Secondary | ICD-10-CM | POA: Insufficient documentation

## 2021-09-25 LAB — POCT RAPID STREP A (OFFICE): Rapid Strep A Screen: NEGATIVE

## 2021-09-25 MED ORDER — CHLORASEPTIC 1.4 % MT LIQD: 1.0000 | OROMUCOSAL | 0 refills | Status: DC | PRN

## 2021-09-25 MED ORDER — GUAIFENESIN 200 MG PO TABS: 200.0000 mg | ORAL_TABLET | ORAL | 0 refills | Status: DC | PRN

## 2021-09-25 NOTE — Discharge Instructions (Signed)
Strep was negative.  Throat culture and COVID test are pending.  We will call if they are positive.  It appears that you have a viral illness as we discussed.  You have been sent two medications to help alleviate symptoms.  Please follow-up if symptoms persist or worsen.

## 2021-09-25 NOTE — ED Provider Notes (Signed)
[facility]    CSN: [id number] Arrival date & time: 09/25/21  1807      History   Chief Complaint Chief Complaint  Patient presents with   Sore Throat    Drainage in nose also - Entered by patient   appt 6    HPI Peggy Garza is a 21 y.o. female.   Patient presents with sore throat, nasal drainage, cough that started about 4 days ago.  Denies any known sick contacts or fever.  Denies chest pain, shortness of breath, ear pain, nausea, vomiting, diarrhea, abdominal pain.  Patient reports over-the-counter cold and flu medication with minimal improvement in symptoms.   Sore Throat    Past Medical History:  Diagnosis Date   Genital herpes    Headache    Other fatigue    PCOS (polycystic ovarian syndrome)    Prediabetes    Shortness of breath on exertion     Patient Active Problem List   Diagnosis Date Noted   Bilateral pendulous breasts 08/27/2021   Chronic migraine without aura without status migrainosus, not intractable 10/23/2020   Hyperglycemia 09/02/2020   Class 3 severe obesity with serious comorbidity and body mass index (BMI) of 45.0 to 49.9 in adult (HCC) 09/02/2020   Vitamin D deficiency 06/22/2019   Prediabetes 06/22/2019   PCOS (polycystic ovarian syndrome) 06/15/2019   Genital herpes simplex 03/01/2019   Gallstone 03/01/2019   PTSD (post-traumatic stress disorder) 08/21/2017   MDD (major depressive disorder), recurrent episode, mild (HCC) 08/21/2017    Past Surgical History:  Procedure Laterality Date   TONSILLECTOMY      OB History     Gravida  0   Para  0   Term  0   Preterm  0   AB  0   Living  0      SAB  0   IAB  0   Ectopic  0   Multiple  0   Live Births  0            Home Medications    Prior to Admission medications   Medication Sig Start Date End Date Taking? Authorizing Provider  guaiFENesin 200 MG tablet Take 1 tablet (200 mg total) by mouth every 4 (four) hours as needed for cough or to  loosen phlegm. 09/25/21  Yes Shaniyah Wix, Rolly Salter E, FNP  phenol (CHLORASEPTIC) 1.4 % LIQD Use as directed 1 spray in the mouth or throat as needed for throat irritation / pain. 09/25/21  Yes Patriciann Becht, Rolly Salter E, FNP  acyclovir (ZOVIRAX) 200 MG/5ML suspension Take 800 mg by mouth as needed.    [provider]  albuterol (VENTOLIN HFA) 108 (90 Base) MCG/ACT inhaler Inhale 2 puffs into the lungs every 6 (six) hours as needed for wheezing or shortness of breath (Cough). 04/24/21   Theadora Rama Scales, PA-C  BUTALBITAL-ACETAMINOPHEN PO Take 1 tablet by mouth See admin instructions. Take 1 tablet by mouth every 4 hours as needed.    [provider]  cetirizine (ZYRTEC ALLERGY) 10 MG tablet Take 1 tablet (10 mg total) by mouth at bedtime. 04/24/21 07/23/21  Theadora Rama Scales, PA-C  fluticasone (FLONASE) 50 MCG/ACT nasal spray Place 2 sprays into both nostrils daily. 04/24/21   Theadora Rama Scales, PA-C  ipratropium (ATROVENT) 0.06 % nasal spray Place 2 sprays into both nostrils 4 (four) times daily. As needed for nasal congestion, runny nose 04/24/21   Theadora Rama Scales, PA-C  ondansetron (ZOFRAN-ODT) 4 MG disintegrating tablet Take 1-2  tablets (4-8 mg total) by mouth every 8 (eight) hours as needed. 10/23/20   Anson Fret, MD  rizatriptan (MAXALT-MLT) 10 MG disintegrating tablet Take 1 tablet (10 mg total) by mouth as needed for migraine. May repeat in 2 hours if needed 10/23/20   Anson Fret, MD  Semaglutide-Weight Management 0.5 MG/0.5ML SOAJ Inject 0.5 mg into the skin once a week for 28 days. 09/20/21 10/18/21  Levie Heritage, DO  Semaglutide-Weight Management 1 MG/0.5ML SOAJ Inject 1 mg into the skin once a week for 28 days. 10/19/21 11/16/21  Levie Heritage, DO  topiramate (TOPAMAX) 50 MG tablet TAKE 1 TABLET(50 MG) BY MOUTH AT BEDTIME 01/08/21   Anson Fret, MD  Vitamin D, Ergocalciferol, (DRISDOL) 1.25 MG (50000 UNIT) CAPS capsule Take 1 capsule (50,000 Units total) by mouth  every 7 (seven) days. 07/17/21   Danford, Jinny Blossom, NP    Family History Family History  Problem Relation Age of Onset   Hypertension Mother    Thyroid disease Mother    Sleep apnea Mother    Obesity Mother    Migraines Mother    Obesity Father    Sleep apnea Maternal Grandmother     Social History Social History   Tobacco Use   Smoking status: Never   Smokeless tobacco: Never  Vaping Use   Vaping Use: Never used  Substance Use Topics   Alcohol use: Yes    Alcohol/week: 24.0 standard drinks of alcohol    Types: 4 Glasses of wine, 20 Shots of liquor per week    Comment: occ   Drug use: Never     Allergies   Victoza [liraglutide]   Review of Systems Review of Systems Per HPI  Physical Exam Triage Vital Signs ED Triage Vitals  Enc Vitals Group     BP 09/25/21 1831 124/86     Pulse Rate 09/25/21 1831 85     Resp 09/25/21 1831 17     Temp 09/25/21 1831 98.7 F (37.1 C)     Temp Source 09/25/21 1831 Oral     SpO2 09/25/21 1831 98 %     Weight --      Height --      Head Circumference --      Peak Flow --      Pain Score 09/25/21 1830 0     Pain Loc --      Pain Edu? --      Excl. in GC? --    No data found.  Updated Vital Signs BP 124/86 (BP Location: Right Arm)   Pulse 85   Temp 98.7 F (37.1 C) (Oral)   Resp 17   SpO2 98%   Visual Acuity Right Eye Distance:   Left Eye Distance:   Bilateral Distance:    Right Eye Near:   Left Eye Near:    Bilateral Near:     Physical Exam Constitutional:      General: She is not in acute distress.    Appearance: Normal appearance. She is not toxic-appearing or diaphoretic.  HENT:     Head: Normocephalic and atraumatic.     Right Ear: Tympanic membrane and ear canal normal.     Left Ear: Tympanic membrane and ear canal normal.     Nose: Congestion present.     Mouth/Throat:     Mouth: Mucous membranes are moist.     Pharynx: Posterior oropharyngeal erythema present. No pharyngeal swelling, oropharyngeal  exudate or uvula swelling.  Tonsils: No tonsillar exudate or tonsillar abscesses.  Eyes:     Extraocular Movements: Extraocular movements intact.     Conjunctiva/sclera: Conjunctivae normal.     Pupils: Pupils are equal, round, and reactive to light.  Cardiovascular:     Rate and Rhythm: Normal rate and regular rhythm.     Pulses: Normal pulses.     Heart sounds: Normal heart sounds.  Pulmonary:     Effort: Pulmonary effort is normal. No respiratory distress.     Breath sounds: Normal breath sounds. No wheezing.  Abdominal:     General: Abdomen is flat. Bowel sounds are normal.     Palpations: Abdomen is soft.  Musculoskeletal:        General: Normal range of motion.     Cervical back: Normal range of motion.  Skin:    General: Skin is warm and dry.  Neurological:     General: No focal deficit present.     Mental Status: She is alert and oriented to person, place, and time. Mental status is at baseline.  Psychiatric:        Mood and Affect: Mood normal.        Behavior: Behavior normal.      UC Treatments / Results  Labs (all labs ordered are listed, but only abnormal results are displayed) Labs Reviewed  CULTURE, GROUP A STREP ([facility])  SARS CORONAVIRUS 2 BY RT PCR  POCT RAPID STREP A (OFFICE)    EKG   Radiology No results found.  Procedures Procedures (including critical care time)  Medications Ordered in UC Medications - No data to display  Initial Impression / Assessment and Plan / UC Course  I have reviewed the triage vital signs and the nursing notes.  Pertinent labs & imaging results that were available during my care of the patient were reviewed by me and considered in my medical decision making (see chart for details).     Patient presents with symptoms likely from a viral upper respiratory infection. Differential includes bacterial pneumonia, sinusitis, allergic rhinitis, COVID-19, flu. Do not suspect underlying cardiopulmonary process. Symptoms  seem unlikely related to ACS, CHF or COPD exacerbations, pneumonia, pneumothorax. Patient is nontoxic appearing and not in need of emergent medical intervention.  Rapid strep was negative.  Throat culture and COVID test pending.  Recommended symptom control with over the counter medications.  Patient sent prescriptions.  If COVID test is positive and inside the 5-day treatment window, patient will qualify for Paxlovid or molnupiravir as the GFR in May was normal.  Return if symptoms fail to improve in 1-2 weeks or you develop shortness of breath, chest pain, severe headache. Patient states understanding and is agreeable.  Discharged with PCP followup.  Final Clinical Impressions(s) / UC Diagnoses   Final diagnoses:  Viral upper respiratory tract infection with cough  Sore throat     Discharge Instructions      Strep was negative.  Throat culture and COVID test are pending.  We will call if they are positive.  It appears that you have a viral illness as we discussed.  You have been sent two medications to help alleviate symptoms.  Please follow-up if symptoms persist or worsen.    ED Prescriptions     Medication Sig Dispense Auth. Provider   guaiFENesin 200 MG tablet Take 1 tablet (200 mg total) by mouth every 4 (four) hours as needed for cough or to loosen phlegm. 30 suppository [place]berg, [place] E, [place]   phenol (CHLORASEPTIC) 1.4 %  LIQD Use as directed 1 spray in the mouth or throat as needed for throat irritation / pain. 118 mL Gustavus Bryant, [place]      PDMP not reviewed this encounter.   Gustavus Bryant, [place] 09/25/21 [phone]

## 2021-09-25 NOTE — ED Triage Notes (Signed)
Pt having intermittent sore throat since SUnday that is worse in mornings when wakes up. Has some congestion and cough

## 2021-09-26 ENCOUNTER — Encounter: Payer: Self-pay | Admitting: Physical Therapy

## 2021-09-26 ENCOUNTER — Ambulatory Visit: Payer: BC Managed Care – PPO | Admitting: Physical Therapy

## 2021-09-26 DIAGNOSIS — R252 Cramp and spasm: Secondary | ICD-10-CM | POA: Diagnosis not present

## 2021-09-26 DIAGNOSIS — M546 Pain in thoracic spine: Secondary | ICD-10-CM | POA: Diagnosis not present

## 2021-09-26 LAB — SARS CORONAVIRUS 2 BY RT PCR: SARS Coronavirus 2 by RT PCR: NEGATIVE

## 2021-09-26 NOTE — Therapy (Signed)
OUTPATIENT PHYSICAL THERAPY TREATMENT NOTE   Patient Name: Peggy Garza MRN: [id number] DOB:[birth year], 21 y.o., female Today's Date: 09/26/2021    PCP: Peggy Quarry, PA-C   REFERRING PROVIDER: Lennice Sites, MD  END OF SESSION:   PT End of Session - 09/26/21 0806     Visit Number 3    Number of Visits 6    Date for PT Re-Evaluation 10/25/21    Authorization Type BCBS COMM PPO; [place] MEDICAID HE[employer]Y BLUE    PT Start Time 0803    PT Stop Time 0845    PT Time Calculation (min) 42 min             Past Medical History:  Diagnosis Date   Genital herpes    Headache    Other fatigue    PCOS (polycystic ovarian syndrome)    Prediabetes    Shortness of breath on exertion    Past Surgical History:  Procedure Laterality Date   TONSILLECTOMY     Patient Active Problem List   Diagnosis Date Noted   Bilateral pendulous breasts 08/27/2021   Chronic migraine without aura without status migrainosus, not intractable 10/23/2020   Hyperglycemia 09/02/2020   Class 3 severe obesity with serious comorbidity and body mass index (BMI) of 45.0 to 49.9 in adult ([place]) 09/02/2020   Vitamin D deficiency 06/22/2019   Prediabetes 06/22/2019   PCOS (polycystic ovarian syndrome) 06/15/2019   Genital herpes simplex 03/01/2019   Gallstone 03/01/2019   PTSD (post-traumatic stress disorder) 08/21/2017   MDD (major depressive disorder), recurrent episode, mild ([place]) 08/21/2017    REFERRING DIAG:  R51.8,A41.66 (ICD-10-CM) - Chronic bilateral thoracic back pain  N62 (ICD-10-CM) - Breast hypertrophy    THERAPY DIAG:  Pain in thoracic spine  Cramp and spasm  Rationale for Evaluation and Treatment Rehabilitation  PERTINENT HISTORY: Migraines, obesity   PRECAUTIONS: none  SUBJECTIVE: I am still using the tennis ball. The exercises provide short term relief.  My pain is in the same place, left sided near my shoulder blade.   PAIN:  Are you having pain? Yes: NPRS scale:  5/10 Pain location: Mid back Pain description: Constant, throb Aggravating factors: Worse at night, walking for exercise, lifting Relieving factors: Heating pad 5-9/10   OBJECTIVE: (objective measures completed at initial evaluation unless otherwise dated)   DIAGNOSTIC FINDINGS:  None relevant   PATIENT SURVEYS:  NA     COGNITION: Overall cognitive status: Within functional limits for tasks assessed     SENSATION: WFL   POSTURE: rounded shoulders, forward head, increased lumbar lordosis, increased thoracic kyphosis, and anterior pelvic tilt, CT juction step off   PALPATION:            TTP to the mid thoracic paraspinal areawith increased muscle tension noted   CERVICAL ROM:    Active ROM A/PROM (deg) eval AROM 09/26/21  Flexion 45 no pain 60  Extension 30 pressure pain lower neck 40  Right lateral flexion 35 pulling pain L 55, pain L  Left lateral flexion 42 pulling pain R 55  Right rotation 62 no pain 75  Left rotation 75 no pain 75  pain   (Blank rows = not tested)   UPPER EXTREMITY ROM:                       UE ROMs were WNLs     UPPER EXTREMITY MMT:           UE MMT was WNLs and equal  R to L     CERVICAL SPECIAL TESTS:  Sprulling's Test: pressure R cervical c R rotation and ext, pulling R upper shoulder c L rotation and ext     TODAY'S TREATMENT:  [facility] Adult PT Treatment:                                                DATE: 09/26/21 Therapeutic Exercise: UBE Level 2 3 min each way Green Band Row 10 x 2 Red band shoulder extensions 10 x 2  Doorway Pec Stretch at 90 Degrees Abduction  3 reps 30" Standing Star Pattern Red band x 10 Soft Foam roller thoracic extension x 10- hands behind head, x 2 with reach over head  Open books  x 10 each  Upper trap and levator stretches Supine Chin Tuck over towel Roll 5 sec x 10  Self Care Posture education: sitting posure   [facility] Adult PT Treatment:                                                DATE:  09/12/21 Therapeutic Exercise: UBE Level 1 2 min each way Standing Star Pattern Red band x 10 Upper trap and levator stretches Green Band Row 10 x 2 Cervical Retraction  10 reps 5" Scapular Retraction  5 reps 5" Doorway Pec Stretch at 90 Degrees Abduction  3 reps 30" Soft Foam roller thoracic extension x 10- hands behind head Open books  x 10 each    [facility] Adult PT Treatment:                                                DATE: 09/03/21 Therapeutic Exercise: Cervical Retraction  5 reps 5" Scapular Retraction  5 reps 5" Doorway Pec Stretch at 90 Degrees Abduction  3 reps 30"   Self Care: Tennis ball massage on wall   PATIENT EDUCATION:  Education details: Eval findings, POC, HEP, massage c tennis ball Person educated: Patient Education method: Explanation, Demonstration, Tactile cues, Verbal cues, and Handouts Education comprehension: verbalized understanding, returned demonstration, verbal cues required, and tactile cues required     HOME EXERCISE PROGRAM: Access Code: [email or web address] URL: [email or web address] Date: 09/12/2021 Prepared by: Peggy Garza  Exercises - Seated Passive Cervical Retraction  - 6 x daily - 7 x weekly - 1 sets - 3-5 reps - 5 hold - Seated Scapular Retraction  - 6 x daily - 7 x weekly - 1 sets - 3-5 reps - 5 hold - Doorway Pec Stretch at 90 Degrees Abduction  - 2 x daily - 7 x weekly - 1 sets - 2-3 reps - 30 hold - Alternating star pattern  - 1 x daily - 7 x weekly - 2 sets - 10 reps - Seated Upper Trapezius Stretch  - 1 x daily - 7 x weekly - 1 sets - 3 reps - 20 hold - Gentle Levator Scapulae Stretch  - 1 x daily - 7 x weekly - 1 sets - 3 reps - 20 hold   ASSESSMENT:   CLINICAL IMPRESSION: Patient is a 21 y.o. F who  was seen today for physical therapy treatment for Breast hypertrophy; Chronic bilateral thoracic back pain. She reports compliance with HEP and short term relief of pain. Her Cervical AROM has improved in all planes with pain  present during R side bend and rotation.  Progressed DNF strength to supine position. Pt tolerated the progression well. Pt reported feeling much better after session. Time spent with posture education and role it plays in pain.      OBJECTIVE IMPAIRMENTS decreased activity tolerance, decreased ROM, decreased strength, increased muscle spasms, postural dysfunction, and obesity.    ACTIVITY LIMITATIONS sitting and sleeping   PARTICIPATION LIMITATIONS: driving and occupation   [place], Past/current experiences, Time since onset of injury/illness/exacerbation, and 1-2 comorbidities: migraines, obesity  are also affecting patient's functional outcome.    REHAB POTENTIAL: Good   CLINICAL DECISION MAKING: Stable/uncomplicated   EVALUATION COMPLEXITY: Low     GOALS:   SHORT TERM GOALS+ LTGs     LONG TERM GOALS: Target date: 10/25/21   Pt will be able to demonstrated proper sitting posture Baseline: Forward head and rounded shoulder Status: 09/26/21-has not been paying attention Goal status: ONGOING   2.  Pt will demonstrate improved cervcial ROM by at least 5d for all motions for improved neck function Baseline: See flow sheet Goal status: MET 09/26/21   3.  Pt will report a decrease in pain to 6/10 or less for improved QOL Baseline: 5-9/10 Status: Pain intensity less overall, no more than 5/10. Goal status: MET 09/26/21   4.  Pt will report improved tolerance for working at a computer for 30 mins Baseline: Increased pain with 30 mins Status 09/26/21: Pain level improved, able to complete 60 minutes  Goal status: MET 09/26/21   5.  Pt will be ind in a final HEP to maintain achieved LOF Baseline: started on eval Goal status: ONGOING     PLAN: PT FREQUENCY: 1x/week   PT DURATION: 5 weeks   PLANNED INTERVENTIONS: Therapeutic exercises, Therapeutic activity, Patient/Family education, Self Care, Dry Needling, Electrical stimulation, Cryotherapy, Moist heat, Taping,  Traction, Ionotophoresis 81m/ml Dexamethasone, Manual therapy, and Re-evaluation   PLAN FOR NEXT SESSION: Review HEP, progress therex as indicated, education for computer set up for improved work posture, use of modalities, manual care, and TPDN as indicated    JHessie Garza PTA 09/26/21 2:09 PM Phone: [phone]: [phone]

## 2021-09-29 LAB — CULTURE, GROUP A STREP (THRC)

## 2021-09-30 ENCOUNTER — Other Ambulatory Visit (INDEPENDENT_AMBULATORY_CARE_PROVIDER_SITE_OTHER): Payer: Self-pay | Admitting: Adult Health

## 2021-09-30 DIAGNOSIS — E559 Vitamin D deficiency, unspecified: Secondary | ICD-10-CM

## 2021-10-03 ENCOUNTER — Ambulatory Visit: Payer: BC Managed Care – PPO | Admitting: Physical Therapy

## 2021-10-03 ENCOUNTER — Encounter: Payer: Self-pay | Admitting: Physical Therapy

## 2021-10-03 DIAGNOSIS — R252 Cramp and spasm: Secondary | ICD-10-CM | POA: Diagnosis not present

## 2021-10-03 DIAGNOSIS — M546 Pain in thoracic spine: Secondary | ICD-10-CM

## 2021-10-03 NOTE — Therapy (Signed)
OUTPATIENT PHYSICAL THERAPY TREATMENT NOTE   Patient Name: Peggy Garza MRN: [id number] DOB:[birth year], 21 y.o., female Today's Date: 10/03/2021    PCP: Sherin Quarry, PA-C   REFERRING PROVIDER: Lennice Sites, MD  END OF SESSION:   PT End of Session - 10/03/21 0818     Visit Number 4    Number of Visits 6    Date for PT Re-Evaluation 10/25/21    Authorization Type [employer]BS COMM PPO; [place] [employer]ICA[employer] BLUE    PT Start Time 0820    PT Stop Time 0845    PT Time Calculation (min) 25 min             Past Medical History:  Diagnosis Date   Genital herpes    Headache    Other fatigue    PCOS (polycystic ovarian syndrome)    Prediabetes    Shortness of breath on exertion    Past Surgical History:  Procedure Laterality Date   TONSILLECTOMY     Patient Active Problem List   Diagnosis Date Noted   Bilateral pendulous breasts 08/27/2021   Chronic migraine without aura without status migrainosus, not intractable 10/23/2020   Hyperglycemia 09/02/2020   Class 3 severe obesity with serious comorbidity and body mass index (BMI) of 45.0 to 49.9 in adult ([place]) 09/02/2020   Vitamin D deficiency 06/22/2019   Prediabetes 06/22/2019   PCOS (polycystic ovarian syndrome) 06/15/2019   Genital herpes simplex 03/01/2019   Gallstone 03/01/2019   PTSD (post-traumatic stress disorder) 08/21/2017   MDD (major depressive disorder), recurrent episode, mild ([employer]) 08/21/2017    REFERRING DIAG:  M01.0,U72.53 (ICD-10-CM) - Chronic bilateral thoracic back pain  N62 (ICD-10-CM) - Breast hypertrophy    THERAPY DIAG:  Pain in thoracic spine  Cramp and spasm  Rationale for Evaluation and Treatment Rehabilitation  PERTINENT HISTORY: Migraines, obesity   PRECAUTIONS: none  SUBJECTIVE: I am still the same and hurting more this morning when I woke up at 8/10. The pain reduced to 5/10 after getting up and moving. I usually I can do my exercises in the morning but not this morning. The  exercises just help minimize for the moment, only short term relief.    PAIN:  Are you having pain? Yes: NPRS scale: 5/10 Pain location: Mid back Pain description: Constant, throb Aggravating factors: Worse at night, walking for exercise, lifting Relieving factors: Heating pad 5-9/10   OBJECTIVE: (objective measures completed at initial evaluation unless otherwise dated)   DIAGNOSTIC FINDINGS:  None relevant   PATIENT SURVEYS:  NA     COGNITION: Overall cognitive status: Within functional limits for tasks assessed     SENSATION: WFL   POSTURE: rounded shoulders, forward head, increased lumbar lordosis, increased thoracic kyphosis, and anterior pelvic tilt, CT juction step off   PALPATION:            TTP to the mid thoracic paraspinal areawith increased muscle tension noted   CERVICAL ROM:    Active ROM A/PROM (deg) eval AROM 09/26/21 AROM 10/03/21  Flexion 45 no pain 60   Extension 30 pressure pain lower neck 40   Right lateral flexion 35 pulling pain L 55, pain L 55  Left lateral flexion 42 pulling pain R 55 55  Right rotation 62 no pain 75 80  Left rotation 75 no pain 75  pain 80   (Blank rows = not tested)   UPPER EXTREMITY ROM:  UE ROMs were WNLs     UPPER EXTREMITY MMT:           UE MMT was WNLs and equal R to L     CERVICAL SPECIAL TESTS:  Sprulling's Test: pressure R cervical c R rotation and ext, pulling R upper shoulder c L rotation and ext     TODAY'S TREATMENT:  [facility] Adult PT Treatment:                                                DATE: 10/03/21 Therapeutic Exercise: UBE Level 2 3 min each way Blue Band Row 10 x 2 Blue band shoulder extensions 10 x 2  Doorway Pec Stretch at 90 Degrees Abduction  3 reps 30" Standing Star Pattern Green band x 10 Open books  x 10 each  Upper trap and levator stretches   Self Care Computer -sitting posture/ergonomics  [facility] Adult PT Treatment:                                                 DATE: 09/26/21 Therapeutic Exercise: UBE Level 2 3 min each way Green Band Row 10 x 2 Red band shoulder extensions 10 x 2  Doorway Pec Stretch at 90 Degrees Abduction  3 reps 30" Standing Star Pattern Red band x 10 Soft Foam roller thoracic extension x 10- hands behind head, x 2 with reach over head  Open books  x 10 each  Upper trap and levator stretches Supine Chin Tuck over towel Roll 5 sec x 10  Self Care Posture education: sitting posure   [facility] Adult PT Treatment:                                                DATE: 09/12/21 Therapeutic Exercise: UBE Level 1 2 min each way Standing Star Pattern Red band x 10 Upper trap and levator stretches Green Band Row 10 x 2 Cervical Retraction  10 reps 5" Scapular Retraction  5 reps 5" Doorway Pec Stretch at 90 Degrees Abduction  3 reps 30" Soft Foam roller thoracic extension x 10- hands behind head Open books  x 10 each    [facility] Adult PT Treatment:                                                DATE: 09/03/21 Therapeutic Exercise: Cervical Retraction  5 reps 5" Scapular Retraction  5 reps 5" Doorway Pec Stretch at 90 Degrees Abduction  3 reps 30"   Self Care: Tennis ball massage on wall   PATIENT EDUCATION:  Education details: Eval findings, POC, HEP, massage c tennis ball Person educated: Patient Education method: Explanation, Demonstration, Tactile cues, Verbal cues, and Handouts Education comprehension: verbalized understanding, returned demonstration, verbal cues required, and tactile cues required     HOME EXERCISE PROGRAM: Access Code: [email or web address] URL: [email or web address] Date: 09/12/2021 Prepared by: [name]  Exercises - Seated Passive Cervical Retraction  - 6 x  daily - 7 x weekly - 1 sets - 3-5 reps - 5 hold - Seated Scapular Retraction  - 6 x daily - 7 x weekly - 1 sets - 3-5 reps - 5 hold - Doorway Pec Stretch at 90 Degrees Abduction  - 2 x daily - 7 x weekly - 1 sets - 2-3 reps - 30  hold - Alternating star pattern  - 1 x daily - 7 x weekly - 2 sets - 10 reps - Seated Upper Trapezius Stretch  - 1 x daily - 7 x weekly - 1 sets - 3 reps - 20 hold - Gentle Levator Scapulae Stretch  - 1 x daily - 7 x weekly - 1 sets - 3 reps - 20 hold   ASSESSMENT:   CLINICAL IMPRESSION: Patient is a 21 y.o. F who was seen today for physical therapy treatment for Breast hypertrophy; Chronic bilateral thoracic back pain. She reports compliance with HEP and continued short term relief of pain. Her morning pain this morning was higher than usual at 8/10. She reports being more mindful of posture over the last week in both sitting at computer and standing. She can demonstrate proper sitting posture. Further discussed her sitting posture at computer.  LTG#  1 MET. Her AROM of cervical spine is pain-free today and improved for rotation to 80 degrees. Progress resistance level for scap bands with good tolerance. Shorter session due to patient being late.      OBJECTIVE IMPAIRMENTS decreased activity tolerance, decreased ROM, decreased strength, increased muscle spasms, postural dysfunction, and obesity.    ACTIVITY LIMITATIONS sitting and sleeping   PARTICIPATION LIMITATIONS: driving and occupation   [place], Past/current experiences, Time since onset of injury/illness/exacerbation, and 1-2 comorbidities: migraines, obesity  are also affecting patient's functional outcome.    REHAB POTENTIAL: Good   CLINICAL DECISION MAKING: Stable/uncomplicated   EVALUATION COMPLEXITY: Low     GOALS:   SHORT TERM GOALS+ LTGs     LONG TERM GOALS: Target date: 10/25/21   Pt will be able to demonstrated proper sitting posture Baseline: Forward head and rounded shoulder Status: 09/26/21-has not been paying attention Status: 10/03/21: is more mindful of posture and can demonstrate  Goal status: MET   2.  Pt will demonstrate improved cervcial ROM by at least 5d for all motions for improved  neck function Baseline: See flow sheet Goal status: MET 09/26/21   3.  Pt will report a decrease in pain to 6/10 or less for improved QOL Baseline: 5-9/10 Status:09/26/21:  Pain intensity less overall, no more than 5/10. Goal status: MET 09/26/21   4.  Pt will report improved tolerance for working at a computer for 30 mins Baseline: Increased pain with 30 mins Status 09/26/21: Pain level improved, able to complete 60 minutes  Goal status: MET 09/26/21   5.  Pt will be ind in a final HEP to maintain achieved LOF Baseline: started on eval Goal status: ONGOING     PLAN: PT FREQUENCY: 1x/week   PT DURATION: 5 weeks   PLANNED INTERVENTIONS: Therapeutic exercises, Therapeutic activity, Patient/Family education, Self Care, Dry Needling, Electrical stimulation, Cryotherapy, Moist heat, Taping, Traction, Ionotophoresis 18m/ml Dexamethasone, Manual therapy, and Re-evaluation   PLAN FOR NEXT SESSION: Review HEP, progress therex as indicated, use of modalities, manual care, and TPDN as indicated    JHessie Diener PTA 10/03/21 8:42 AM Phone: [phone]: [phone]

## 2021-10-10 ENCOUNTER — Ambulatory Visit: Payer: BC Managed Care – PPO | Admitting: Physical Therapy

## 2021-10-10 ENCOUNTER — Encounter: Payer: Self-pay | Admitting: Physical Therapy

## 2021-10-10 DIAGNOSIS — R252 Cramp and spasm: Secondary | ICD-10-CM | POA: Diagnosis not present

## 2021-10-10 DIAGNOSIS — M546 Pain in thoracic spine: Secondary | ICD-10-CM

## 2021-10-10 NOTE — Therapy (Signed)
OUTPATIENT PHYSICAL THERAPY TREATMENT NOTE   Patient Name: Peggy Garza MRN: [id number] DOB:Dec 22, 2000, 21 y.o., female Today's Date: 10/10/2021    PCP: Sherin Quarry, PA-C   REFERRING PROVIDER: Lennice Sites, MD  END OF SESSION:   PT End of Session - 10/10/21 0819     Visit Number 5    Number of Visits 6    Date for PT Re-Evaluation 10/25/21    PT Start Time 0815   15 min late   PT Stop Time 0840    PT Time Calculation (min) 25 min              Past Medical History:  Diagnosis Date   Genital herpes    Headache    Other fatigue    PCOS (polycystic ovarian syndrome)    Prediabetes    Shortness of breath on exertion    Past Surgical History:  Procedure Laterality Date   TONSILLECTOMY     Patient Active Problem List   Diagnosis Date Noted   Bilateral pendulous breasts 08/27/2021   Chronic migraine without aura without status migrainosus, not intractable 10/23/2020   Hyperglycemia 09/02/2020   Class 3 severe obesity with serious comorbidity and body mass index (BMI) of 45.0 to 49.9 in adult ([place]) 09/02/2020   Vitamin D deficiency 06/22/2019   Prediabetes 06/22/2019   PCOS (polycystic ovarian syndrome) 06/15/2019   Genital herpes simplex 03/01/2019   Gallstone 03/01/2019   PTSD (post-traumatic stress disorder) 08/21/2017   MDD (major depressive disorder), recurrent episode, mild ([place]) 08/21/2017    REFERRING DIAG:  V61.6,W73.71 (ICD-10-CM) - Chronic bilateral thoracic back pain  N62 (ICD-10-CM) - Breast hypertrophy    THERAPY DIAG:  Pain in thoracic spine  Cramp and spasm  Rationale for Evaluation and Treatment Rehabilitation  PERTINENT HISTORY: Migraines, obesity   PRECAUTIONS: none  SUBJECTIVE: I am about the same. No pain right now. The exercises provide Short term relief.    PAIN:  Are you having pain? Yes: NPRS scale: 0/10 Pain location: Mid back Pain description: Constant, throb Aggravating factors: Worse at night, walking for  exercise, lifting Relieving factors: Heating pad    OBJECTIVE: (objective measures completed at initial evaluation unless otherwise dated)   DIAGNOSTIC FINDINGS:  None relevant   PATIENT SURVEYS:  NA     COGNITION: Overall cognitive status: Within functional limits for tasks assessed     SENSATION: WFL   POSTURE: rounded shoulders, forward head, increased lumbar lordosis, increased thoracic kyphosis, and anterior pelvic tilt, CT juction step off   PALPATION:            TTP to the mid thoracic paraspinal areawith increased muscle tension noted   CERVICAL ROM:    Active ROM A/PROM (deg) eval AROM 09/26/21 AROM 10/03/21  Flexion 45 no pain 60   Extension 30 pressure pain lower neck 40   Right lateral flexion 35 pulling pain L 55, pain L 55  Left lateral flexion 42 pulling pain R 55 55  Right rotation 62 no pain 75 80  Left rotation 75 no pain 75  pain 80   (Blank rows = not tested)   UPPER EXTREMITY ROM:                       UE ROMs were WNLs     UPPER EXTREMITY MMT:           UE MMT was WNLs and equal R to L     CERVICAL SPECIAL TESTS:  Sprulling's Test: pressure R cervical c R rotation and ext, pulling R upper shoulder c L rotation and ext     TODAY'S TREATMENT:  [facility] Adult PT Treatment:                                                DATE: 10/10/21 Therapeutic Exercise: UBE Level 2 3 min each way Blue Band Row 10 x 2 Blue band shoulder extensions 10 x 2  Doorway Pec Stretch at 90 Degrees Abduction  3 reps 30" Standing Star Pattern Green band x 10 Soft foam roller thoracic extension 10 sec x 5 then with UE flexion Open books  x 10 each   Supine chin tuck with towel roll 5 sec x 10 Upper trap and levator stretches    [facility] Adult PT Treatment:                                                DATE: 10/03/21 Therapeutic Exercise: UBE Level 2 3 min each way Blue Band Row 10 x 2 Blue band shoulder extensions 10 x 2  Doorway Pec Stretch at 90 Degrees Abduction   3 reps 30" Standing Star Pattern Green band x 10 Open books  x 10 each  Upper trap and levator stretches  Self Care Computer -sitting posture/ergonomics  [facility] Adult PT Treatment:                                                DATE: 09/26/21 Therapeutic Exercise: UBE Level 2 3 min each way Green Band Row 10 x 2 Red band shoulder extensions 10 x 2  Doorway Pec Stretch at 90 Degrees Abduction  3 reps 30" Standing Star Pattern Red band x 10 Soft Foam roller thoracic extension x 10- hands behind head, x 2 with reach over head  Open books  x 10 each  Upper trap and levator stretches Supine Chin Tuck over towel Roll 5 sec x 10  Self Care Posture education: sitting posure   [facility] Adult PT Treatment:                                                DATE: 09/12/21 Therapeutic Exercise: UBE Level 1 2 min each way Standing Star Pattern Red band x 10 Upper trap and levator stretches Green Band Row 10 x 2 Cervical Retraction  10 reps 5" Scapular Retraction  5 reps 5" Doorway Pec Stretch at 90 Degrees Abduction  3 reps 30" Soft Foam roller thoracic extension x 10- hands behind head Open books  x 10 each    [facility] Adult PT Treatment:                                                DATE: 09/03/21 Therapeutic Exercise: Cervical Retraction  5 reps 5" Scapular Retraction  5 reps 5" Doorway Pec Stretch at 90 Degrees Abduction  3 reps 30"   Self Care: Tennis ball massage on wall   PATIENT EDUCATION:  Education details: Eval findings, POC, HEP, massage c tennis ball Person educated: Patient Education method: Explanation, Demonstration, Tactile cues, Verbal cues, and Handouts Education comprehension: verbalized understanding, returned demonstration, verbal cues required, and tactile cues required     HOME EXERCISE PROGRAM: Access Code: [email or web address] URL: [email or web address] Date: 09/12/2021 Prepared by: [name]  Exercises - Seated Passive Cervical Retraction  - 6 x daily  - 7 x weekly - 1 sets - 3-5 reps - 5 hold - Seated Scapular Retraction  - 6 x daily - 7 x weekly - 1 sets - 3-5 reps - 5 hold - Doorway Pec Stretch at 90 Degrees Abduction  - 2 x daily - 7 x weekly - 1 sets - 2-3 reps - 30 hold - Alternating star pattern  - 1 x daily - 7 x weekly - 2 sets - 10 reps - Seated Upper Trapezius Stretch  - 1 x daily - 7 x weekly - 1 sets - 3 reps - 20 hold - Gentle Levator Scapulae Stretch  - 1 x daily - 7 x weekly - 1 sets - 3 reps - 20 hold   ASSESSMENT:   CLINICAL IMPRESSION: Patient is a 21 y.o. F who was seen today for physical therapy treatment for Breast hypertrophy; Chronic bilateral thoracic back pain. She reports compliance with HEP and continued short term relief of pain. She has no pain on arrival today. She reports being mindful of posture more often with min improvement. She does report an increased in the amount of tenderness in her upper back. She has been unable to use the tennis ball for self massage.  Shorter session due to patient being late due to car trouble. She has one more scheduled visits to review and finalize HEP.      OBJECTIVE IMPAIRMENTS decreased activity tolerance, decreased ROM, decreased strength, increased muscle spasms, postural dysfunction, and obesity.    ACTIVITY LIMITATIONS sitting and sleeping   PARTICIPATION LIMITATIONS: driving and occupation   [place], Past/current experiences, Time since onset of injury/illness/exacerbation, and 1-2 comorbidities: migraines, obesity  are also affecting patient's functional outcome.    REHAB POTENTIAL: Good   CLINICAL DECISION MAKING: Stable/uncomplicated   EVALUATION COMPLEXITY: Low     GOALS:   SHORT TERM GOALS+ LTGs     LONG TERM GOALS: Target date: 10/25/21   Pt will be able to demonstrated proper sitting posture Baseline: Forward head and rounded shoulder Status: 09/26/21-has not been paying attention Status: 10/03/21: is more mindful of posture and can  demonstrate  Goal status: MET   2.  Pt will demonstrate improved cervcial ROM by at least 5d for all motions for improved neck function Baseline: See flow sheet Goal status: MET 09/26/21   3.  Pt will report a decrease in pain to 6/10 or less for improved QOL Baseline: 5-9/10 Status:09/26/21:  Pain intensity less overall, no more than 5/10. Goal status: MET 09/26/21   4.  Pt will report improved tolerance for working at a computer for 30 mins Baseline: Increased pain with 30 mins Status 09/26/21: Pain level improved, able to complete 60 minutes  Goal status: MET 09/26/21   5.  Pt will be ind in a final HEP to maintain achieved LOF Baseline: started on eval Goal status: ONGOING     PLAN: PT FREQUENCY: 1x/week   PT  DURATION: 5 weeks   PLANNED INTERVENTIONS: Therapeutic exercises, Therapeutic activity, Patient/Family education, Self Care, Dry Needling, Electrical stimulation, Cryotherapy, Moist heat, Taping, Traction, Ionotophoresis 83m/ml Dexamethasone, Manual therapy, and Re-evaluation   PLAN FOR NEXT SESSION: Review HEP, progress therex as indicated  JHessie Diener PTA 10/10/21 8:40 AM Phone: [phone]: [phone]

## 2021-10-16 NOTE — Therapy (Signed)
OUTPATIENT PHYSICAL THERAPY TREATMENT NOTE/discharge   Patient Name: Peggy Garza MRN: [id number] DOB:2000/09/11, 21 y.o., female Today's Date: 10/17/2021    PCP: Sherin Quarry, PA-C   REFERRING PROVIDER: Lennice Sites, MD  END OF SESSION:   PT End of Session - 10/17/21 0808     Visit Number 6    Number of Visits 6    Date for PT Re-Evaluation 10/25/21    Authorization Type BCBS COMM PPO; [place] MEDICAID HEALTHY BLUE    PT Start Time 0807    PT Stop Time 0845    PT Time Calculation (min) 38 min    Activity Tolerance Patient tolerated treatment well    Behavior During Therapy WFL for tasks assessed/performed               Past Medical History:  Diagnosis Date   Genital herpes    Headache    Other fatigue    PCOS (polycystic ovarian syndrome)    Prediabetes    Shortness of breath on exertion    Past Surgical History:  Procedure Laterality Date   TONSILLECTOMY     Patient Active Problem List   Diagnosis Date Noted   Bilateral pendulous breasts 08/27/2021   Chronic migraine without aura without status migrainosus, not intractable 10/23/2020   Hyperglycemia 09/02/2020   Class 3 severe obesity with serious comorbidity and body mass index (BMI) of 45.0 to 49.9 in adult ([place]) 09/02/2020   Vitamin D deficiency 06/22/2019   Prediabetes 06/22/2019   PCOS (polycystic ovarian syndrome) 06/15/2019   Genital herpes simplex 03/01/2019   Gallstone 03/01/2019   PTSD (post-traumatic stress disorder) 08/21/2017   MDD (major depressive disorder), recurrent episode, mild ([place]) 08/21/2017    REFERRING DIAG:  O03.2,Z22.48 (ICD-10-CM) - Chronic bilateral thoracic back pain  N62 (ICD-10-CM) - Breast hypertrophy    THERAPY DIAG:  Pain in thoracic spine  Cramp and spasm  Rationale for Evaluation and Treatment Rehabilitation  PERTINENT HISTORY: Migraines, obesity   PRECAUTIONS: none  SUBJECTIVE: My upper back and shoulder continue to bother me. The exs help, but I  still have high levels of pain.   PAIN:  Are you having pain? Yes: NPRS scale: 0/10 Pain location: Mid back Pain description: Constant, throb Aggravating factors: Worse at night, walking for exercise, lifting Relieving factors: Heating pad Pain range; 0-6/10    OBJECTIVE: (objective measures completed at initial evaluation unless otherwise dated)   DIAGNOSTIC FINDINGS:  None relevant   PATIENT SURVEYS:  NA     COGNITION: Overall cognitive status: Within functional limits for tasks assessed     SENSATION: WFL   POSTURE: rounded shoulders, forward head, increased lumbar lordosis, increased thoracic kyphosis, and anterior pelvic tilt, CT juction step off   PALPATION:            TTP to the mid thoracic paraspinal areawith increased muscle tension noted   CERVICAL ROM:    Active ROM A/PROM (deg) eval AROM 09/26/21 AROM 10/03/21   Flexion 45 no pain 60    Extension 30 pressure pain lower neck 40    Right lateral flexion 35 pulling pain L 55, pain L 55   Left lateral flexion 42 pulling pain R 55 55   Right rotation 62 no pain 75 80   Left rotation 75 no pain 75  pain 80    (Blank rows = not tested)   UPPER EXTREMITY ROM:  UE ROMs were WNLs     UPPER EXTREMITY MMT:           UE MMT was WNLs and equal R to L     CERVICAL SPECIAL TESTS:  Sprulling's Test: pressure R cervical c R rotation and ext, pulling R upper shoulder c L rotation and ext     TODAY'S TREATMENT:  [facility] Adult PT Treatment:                                                DATE: 10/17/21 Therapeutic Exercise: UBE Level 2 3 min each way Blue Band Row 10 x 2 Blue band shoulder extensions 10 x 2  Doorway Pec Stretch at 90 Degrees Abduction  3 reps 30" Standing Star Pattern Green band x 10 Thoracic et in sitting 10x Open books  x 10 each  Supine chin tuck with towel roll 5 sec x 10 Upper trap and levator stretches  AROM- cervical Reviewed HEP and printed final copy Self  Care: PT demonstrated use of a lap top stand to improve ergonomics c a lap top   [facility] Adult PT Treatment:                                                DATE: 10/10/21 Therapeutic Exercise: UBE Level 2 3 min each way Blue Band Row 10 x 2 Blue band shoulder extensions 10 x 2  Doorway Pec Stretch at 90 Degrees Abduction  3 reps 30" Standing Star Pattern Green band x 10 Soft foam roller thoracic extension 10 sec x 5 then with UE flexion Open books  x 10 each   Supine chin tuck with towel roll 5 sec x 10 Upper trap and levator stretches    [facility] Adult PT Treatment:                                                DATE: 10/03/21 Therapeutic Exercise: UBE Level 2 3 min each way Blue Band Row 10 x 2 Blue band shoulder extensions 10 x 2  Doorway Pec Stretch at 90 Degrees Abduction  3 reps 30" Standing Star Pattern Green band x 10 Open books  x 10 each  Upper trap and levator stretches  Self Care Computer -sitting posture/ergonomics  PATIENT EDUCATION:  Education details: Eval findings, POC, HEP, massage c tennis ball Person educated: Patient Education method: Explanation, Demonstration, Tactile cues, Verbal cues, and Handouts Education comprehension: verbalized understanding, returned demonstration, verbal cues required, and tactile cues required     HOME EXERCISE PROGRAM: Access Code: [email or web address] URL: [email or web address] Date: 10/17/2021 Prepared by: Allen Ralls  Exercises - Seated Passive Cervical Retraction  - 6 x daily - 7 x weekly - 1 sets - 3-5 reps - 5 hold - Seated Scapular Retraction  - 6 x daily - 7 x weekly - 1 sets - 3-5 reps - 5 hold - Doorway Pec Stretch at 90 Degrees Abduction  - 2 x daily - 7 x weekly - 1 sets - 2-3 reps - 30 hold - Alternating star pattern  - 1 x daily -   7 x weekly - 2 sets - 10 reps - Seated Upper Trapezius Stretch  - 1 x daily - 7 x weekly - 1 sets - 3 reps - 20 hold - Gentle Levator Scapulae Stretch  - 1 x daily - 7 x weekly - 1  sets - 3 reps - 20 hold - Sidelying Thoracic Rotation with Open Book  - 1 x daily - 7 x weekly - 1 sets - 10 reps - 5-10 hold - Standing Shoulder Row with Anchored Resistance  - 1 x daily - 7 x weekly - 3 sets - 10 reps - 2 hold - Shoulder extension with resistance - Neutral  - 1 x daily - 7 x weekly - 3 sets - 10 reps - 2 hold   ASSESSMENT:   CLINICAL IMPRESSION: Pt completed her final PT session today. Pt demonstrates increase cervical ROM and reports HEP/tennis ball massage, and postural recommendations provide temporary relief, but pt continues to have high level of upper shoulder and back pain. Pt demonstrates proper completion of her HEP. Pt is Dced from PT with established PT goals met.  OBJECTIVE IMPAIRMENTS decreased activity tolerance, decreased ROM, decreased strength, increased muscle spasms, postural dysfunction, and obesity.    ACTIVITY LIMITATIONS sitting and sleeping   PARTICIPATION LIMITATIONS: driving and occupation   PERSONAL FACTORS Fitness, Past/current experiences, Time since onset of injury/illness/exacerbation, and 1-2 comorbidities: migraines, obesity  are also affecting patient's functional outcome.    REHAB POTENTIAL: Good   CLINICAL DECISION MAKING: Stable/uncomplicated   EVALUATION COMPLEXITY: Low     GOALS:   SHORT TERM GOALS+ LTGs     LONG TERM GOALS: Target date: 10/25/21   Pt will be able to demonstrated proper sitting posture Baseline: Forward head and rounded shoulder Status: 09/26/21-has not been paying attention Status: 10/03/21: is more mindful of posture and can demonstrate  Goal status: MET   2.  Pt will demonstrate improved cervcial ROM by at least 5d for all motions for improved neck function Baseline: See flow sheet Goal status: MET 09/26/21   3.  Pt will report a decrease in pain to 6/10 or less for improved QOL Baseline: 5-9/10 Status:09/26/21:  Pain intensity less overall, no more than 5/10. Goal status: MET 09/26/21   4.  Pt will  report improved tolerance for working at a computer for 30 mins Baseline: Increased pain with 30 mins Status 09/26/21: Pain level improved, able to complete 60 minutes  Goal status: MET 09/26/21   5.  Pt will be ind in a final HEP to maintain achieved LOF Baseline: started on eval Goal status: MET     PLAN: PT FREQUENCY: 1x/week   PT DURATION: 5 weeks   PLANNED INTERVENTIONS: Therapeutic exercises, Therapeutic activity, Patient/Family education, Self Care, Dry Needling, Electrical stimulation, Cryotherapy, Moist heat, Taping, Traction, Ionotophoresis 4mg/ml Dexamethasone, Manual therapy, and Re-evaluation   PLAN FOR NEXT SESSION:   PHYSICAL THERAPY DISCHARGE SUMMARY  Visits from Start of Care: 6  Current functional level related to goals / functional outcomes: 6   Remaining deficits: See clinical impression and HEP   Education / Equipment: See clinical impression and HEP   Patient agrees to discharge. Patient goals were met. Patient is being discharged due to being pleased with the current functional level.  Allen Ralls MS, PT 10/17/21 10:39 AM         

## 2021-10-17 ENCOUNTER — Ambulatory Visit: Payer: BC Managed Care – PPO | Attending: Plastic Surgery

## 2021-10-17 DIAGNOSIS — R252 Cramp and spasm: Secondary | ICD-10-CM | POA: Insufficient documentation

## 2021-10-17 DIAGNOSIS — M546 Pain in thoracic spine: Secondary | ICD-10-CM | POA: Insufficient documentation

## 2021-10-31 ENCOUNTER — Encounter (INDEPENDENT_AMBULATORY_CARE_PROVIDER_SITE_OTHER): Payer: Self-pay | Admitting: Family Medicine

## 2021-10-31 ENCOUNTER — Ambulatory Visit (INDEPENDENT_AMBULATORY_CARE_PROVIDER_SITE_OTHER): Payer: BC Managed Care – PPO | Admitting: Family Medicine

## 2021-10-31 VITALS — BP 124/84 | HR 89 | Temp 98.1°F | Ht 61.0 in | Wt 251.0 lb

## 2021-10-31 DIAGNOSIS — R7303 Prediabetes: Secondary | ICD-10-CM

## 2021-10-31 DIAGNOSIS — Z6841 Body Mass Index (BMI) 40.0 and over, adult: Secondary | ICD-10-CM | POA: Diagnosis not present

## 2021-10-31 DIAGNOSIS — E559 Vitamin D deficiency, unspecified: Secondary | ICD-10-CM

## 2021-10-31 DIAGNOSIS — E669 Obesity, unspecified: Secondary | ICD-10-CM | POA: Diagnosis not present

## 2021-10-31 MED ORDER — VITAMIN D (ERGOCALCIFEROL) 1.25 MG (50000 UNIT) PO CAPS: 50000.0000 [IU] | ORAL_CAPSULE | ORAL | 0 refills | Status: DC

## 2021-10-31 MED ORDER — SAXENDA 18 MG/3ML ~~LOC~~ SOPN: 0.6000 mg | PEN_INJECTOR | Freq: Every day | SUBCUTANEOUS | 0 refills | Status: DC

## 2021-11-05 NOTE — Progress Notes (Unsigned)
Chief Complaint:   OBESITY Peggy Garza is here to discuss her progress with her obesity treatment plan along with follow-up of her obesity related diagnoses. Peggy Garza is on the Category 2 Plan or keeping a food journal and adhering to recommended goals of 1500 calories and 95 grams of protein daily and states she is following her eating plan approximately 75% of the time. Peggy Garza states she is walking for 60 minutes 7 times per week.  Today's visit was #: 23 Starting weight: 249 lbs Starting date: 03/06/2020 Today's weight: 251 lbs Today's date: 10/31/2021 Total lbs lost to date: 0 Total lbs lost since last in-office visit: 0  Interim History: Peggy Garza has struggled more  with weight loss. She is unable to find [facility] and she is open to looking at other options.   Subjective:   1. Vitamin D deficiency [name] Vitamin D level is still not at goal on Vitamin D prescription. She notes fatigue.  2. Pre-diabetes Peggy Garza is struggling more with her diet, and she is currently not on a GLP-1 or metformin.   Assessment/Plan:   1. Vitamin D deficiency We will refill prescription Vitamin D for 1 month, and we will recheck labs in 1-2 months. Nonie will follow-up for routine testing of Vitamin D, at least 2-3 times per year to avoid over-replacement.  - Vitamin D, Ergocalciferol, (DRISDOL) 1.25 MG (50000 UNIT) CAPS capsule; Take 1 capsule (50,000 Units total) by mouth every 7 (seven) days.  Dispense: 8 capsule; Refill: 0  2. Pre-diabetes Peggy Garza will start Saxenda, and she will continue to work on her diet. We will recheck labs in 1-2 months.  3. Obesity,current BMI 47.4 Peggy Garza is currently in the action stage of change. As such, her goal is to continue with weight loss efforts. She has agreed to keeping a food journal and adhering to recommended goals of 1500 calories and 100+ grams of protein daily.   We discussed various medication options to help Peggy Garza with her weight loss efforts and we both  agreed to start Saxenda 3 mg daily with no refills (patient is to start at 0.6 mg daily.  - Liraglutide -Weight Management (SAXENDA) 18 MG/3ML SOPN; Inject 0.6 mg into the skin daily.  Dispense: 3 mL; Refill: 0  Exercise goals: As is.   Behavioral modification strategies: increasing lean protein intake.  Peggy Garza has agreed to follow-up with our clinic in 4 weeks. She was informed of the importance of frequent follow-up visits to maximize her success with intensive lifestyle modifications for her multiple health conditions.   Objective:   Blood pressure 124/84, pulse 89, temperature 98.1 F (36.7 C), height 5\' 1"  (1.549 m), weight 251 lb (113.9 kg), SpO2 100 %. Body mass index is 47.43 kg/m.  General: Cooperative, alert, well developed, in no acute distress. HEENT: Conjunctivae and lids unremarkable. Cardiovascular: Regular rhythm.  Lungs: Normal work of breathing. Neurologic: No focal deficits.   Lab Results  Component Value Date   CREATININE 0.97 06/12/2021   BUN 8 06/12/2021   NA 142 06/12/2021   K 4.3 06/12/2021   CL 109 (H) 06/12/2021   CO2 22 06/12/2021   Lab Results  Component Value Date   ALT 15 06/12/2021   AST 36 06/12/2021   ALKPHOS 53 06/12/2021   BILITOT <0.2 06/12/2021   Lab Results  Component Value Date   HGBA1C 5.8 (H) 06/12/2021   HGBA1C 5.5 12/17/2020   HGBA1C 5.9 (H) 08/23/2020   HGBA1C 6.0 (H) 03/06/2020   HGBA1C 5.7 (  H) 03/28/2019   Lab Results  Component Value Date   INSULIN 35.8 (H) 06/12/2021   INSULIN 24.8 12/17/2020   INSULIN 58.5 (H) 08/23/2020   INSULIN 28.7 (H) 03/06/2020   Lab Results  Component Value Date   TSH 1.790 10/23/2020   Lab Results  Component Value Date   CHOL 150 03/06/2020   HDL 43 03/06/2020   LDLCALC 88 03/06/2020   TRIG 103 03/06/2020   Lab Results  Component Value Date   VD25OH 27.5 (L) 06/12/2021   VD25OH 44.6 12/17/2020   VD25OH 29.6 (L) 08/23/2020   Lab Results  Component Value Date   WBC 6.1  03/06/2020   HGB 11.3 03/06/2020   HCT 36.8 03/06/2020   MCV 82 03/06/2020   PLT 283 03/06/2020   No results found for: "IRON", "TIBC", "FERRITIN"  Attestation Statements:   Reviewed by clinician on day of visit: allergies, medications, problem list, medical history, surgical history, family history, social history, and previous encounter notes.   I, Burt Knack, am acting as transcriptionist for Quillian Quince, MD.  I have reviewed the above documentation for accuracy and completeness, and I agree with the above. -  Quillian Quince, MD

## 2021-11-06 ENCOUNTER — Encounter: Payer: Self-pay | Admitting: Family Medicine

## 2021-11-11 ENCOUNTER — Telehealth (INDEPENDENT_AMBULATORY_CARE_PROVIDER_SITE_OTHER): Payer: Self-pay | Admitting: Family Medicine

## 2021-11-11 ENCOUNTER — Encounter (INDEPENDENT_AMBULATORY_CARE_PROVIDER_SITE_OTHER): Payer: Self-pay

## 2021-11-11 NOTE — Telephone Encounter (Signed)
Dr. Leafy Ro - Prior authorization approved for Saxenda. Effective: 11/07/2021 - 03/09/2022. Patient send approval message via mychart.

## 2021-11-13 ENCOUNTER — Ambulatory Visit: Payer: BC Managed Care – PPO | Admitting: Family Medicine

## 2021-11-29 ENCOUNTER — Other Ambulatory Visit: Payer: Self-pay | Admitting: Neurology

## 2021-12-04 ENCOUNTER — Ambulatory Visit (INDEPENDENT_AMBULATORY_CARE_PROVIDER_SITE_OTHER): Payer: BC Managed Care – PPO | Admitting: Family Medicine

## 2021-12-04 ENCOUNTER — Encounter (INDEPENDENT_AMBULATORY_CARE_PROVIDER_SITE_OTHER): Payer: Self-pay | Admitting: Family Medicine

## 2021-12-04 ENCOUNTER — Other Ambulatory Visit (INDEPENDENT_AMBULATORY_CARE_PROVIDER_SITE_OTHER): Payer: Self-pay | Admitting: Family Medicine

## 2021-12-04 VITALS — BP 116/74 | HR 89 | Temp 98.1°F | Ht 61.0 in | Wt 257.0 lb

## 2021-12-04 DIAGNOSIS — E669 Obesity, unspecified: Secondary | ICD-10-CM

## 2021-12-04 DIAGNOSIS — G43709 Chronic migraine without aura, not intractable, without status migrainosus: Secondary | ICD-10-CM

## 2021-12-04 DIAGNOSIS — Z6841 Body Mass Index (BMI) 40.0 and over, adult: Secondary | ICD-10-CM

## 2021-12-04 MED ORDER — TOPIRAMATE 50 MG PO TABS: 25.0000 mg | ORAL_TABLET | Freq: Two times a day (BID) | ORAL | 0 refills | Status: DC

## 2021-12-17 NOTE — Progress Notes (Signed)
Chief Complaint:   OBESITY Peggy Garza is here to discuss her progress with her obesity treatment plan along with follow-up of her obesity related diagnoses. Peggy Garza is on keeping a food journal and adhering to recommended goals of 1500 calories and 100+ grams of protein and states she is following her eating plan approximately 50% of the time. Peggy Garza states she is not currently exercising.  Today's visit was #: 17 Starting weight: 249 lbs Starting date: 03/06/2020 Today's weight: 257 lbs Today's date: 12/04/2021 Total lbs lost to date: 0 Total lbs lost since last in-office visit: 0  Interim History: Patient states she is barely eating and she eats 1-2 meals, but mostly once a day.  She is not journaling as she could not get the app to work.  Subjective:   1. Chronic migraine without aura without status migrainosus, not intractable Patient sees Dr. Lucia Gaskins for migraines.  She was on Topamax once daily, but she has not had it since June 2023.  Assessment/Plan:  No orders of the defined types were placed in this encounter.   Medications Discontinued During This Encounter  Medication Reason   guaiFENesin 200 MG tablet    meloxicam (MOBIC) 7.5 MG tablet    phenol (CHLORASEPTIC) 1.4 % LIQD    topiramate (TOPAMAX) 50 MG tablet Reorder     Meds ordered this encounter  Medications   topiramate (TOPAMAX) 50 MG tablet    Sig: Take 0.5 tablets (25 mg total) by mouth 2 (two) times daily.    Dispense:  30 tablet    Refill:  0    **Patient requests 90 days supply**     1. Chronic migraine without aura without status migrainosus, not intractable Patient will restart Topamax at 25 mg twice daily, and start slowly.  She will increase her hydration and start exercise for stress management.  I explained to the patient that we are not taking over the management of her headaches from Dr. Lucia Gaskins, but Topamax can help with cravings so we will refill for 1 month.  - topiramate (TOPAMAX) 50 MG  tablet; Take 0.5 tablets (25 mg total) by mouth 2 (two) times daily.  Dispense: 30 tablet; Refill: 0  2. Obesity,current BMI 48.6 Peggy Garza is currently in the action stage of change. As such, her goal is to continue with weight loss efforts. She has agreed to change to keeping a food journal and adhering to recommended goals of 1450-1550 calories and 110+ grams of protein daily.   I recommended the patient to write down her calories and grams of protein each day.  She is to bring in her log to her next visit.  Exercise goals: All adults should avoid inactivity. Some physical activity is better than none, and adults who participate in any amount of physical activity gain some health benefits.  Behavioral modification strategies: increasing lean protein intake, decreasing simple carbohydrates, and decreasing eating out.  Peggy Garza has agreed to follow-up with our clinic in 3 to 4 weeks. She was informed of the importance of frequent follow-up visits to maximize her success with intensive lifestyle modifications for her multiple health conditions.   Objective:   Blood pressure 116/74, pulse 89, temperature 98.1 F (36.7 C), height 5\' 1"  (1.549 m), weight 257 lb (116.6 kg), SpO2 98 %. Body mass index is 48.56 kg/m.  General: Cooperative, alert, well developed, in no acute distress. HEENT: Conjunctivae and lids unremarkable. Cardiovascular: Regular rhythm.  Lungs: Normal work of breathing. Neurologic: No focal deficits.  Lab Results  Component Value Date   CREATININE 0.97 06/12/2021   BUN 8 06/12/2021   NA 142 06/12/2021   K 4.3 06/12/2021   CL 109 (H) 06/12/2021   CO2 22 06/12/2021   Lab Results  Component Value Date   ALT 15 06/12/2021   AST 36 06/12/2021   ALKPHOS 53 06/12/2021   BILITOT <0.2 06/12/2021   Lab Results  Component Value Date   HGBA1C 5.8 (H) 06/12/2021   HGBA1C 5.5 12/17/2020   HGBA1C 5.9 (H) 08/23/2020   HGBA1C 6.0 (H) 03/06/2020   HGBA1C 5.7 (H) 03/28/2019    Lab Results  Component Value Date   INSULIN 35.8 (H) 06/12/2021   INSULIN 24.8 12/17/2020   INSULIN 58.5 (H) 08/23/2020   INSULIN 28.7 (H) 03/06/2020   Lab Results  Component Value Date   TSH 1.790 10/23/2020   Lab Results  Component Value Date   CHOL 150 03/06/2020   HDL 43 03/06/2020   LDLCALC 88 03/06/2020   TRIG 103 03/06/2020   Lab Results  Component Value Date   VD25OH 27.5 (L) 06/12/2021   VD25OH 44.6 12/17/2020   VD25OH 29.6 (L) 08/23/2020   Lab Results  Component Value Date   WBC 6.1 03/06/2020   HGB 11.3 03/06/2020   HCT 36.8 03/06/2020   MCV 82 03/06/2020   PLT 283 03/06/2020   No results found for: "IRON", "TIBC", "FERRITIN"  Attestation Statements:   Reviewed by clinician on day of visit: allergies, medications, problem list, medical history, surgical history, family history, social history, and previous encounter notes.   Wilhemena Durie, am acting as transcriptionist for [employer], DO.   I have reviewed the above documentation for accuracy and completeness, and I agree with the above. Marjory Sneddon, D.O.  The [place] was signed into law in 2016 which includes the topic of electronic health records.  This provides immediate access to information in MyChart.  This includes consultation notes, operative notes, office notes, lab results and pathology reports.  If you have any questions about what you read please let us know at your next visit so we can discuss your concerns and take corrective action if need be.  We are right here with you.

## 2021-12-19 ENCOUNTER — Encounter: Payer: Self-pay | Admitting: Plastic Surgery

## 2021-12-19 ENCOUNTER — Encounter: Payer: Self-pay | Admitting: Family Medicine

## 2021-12-19 ENCOUNTER — Ambulatory Visit (INDEPENDENT_AMBULATORY_CARE_PROVIDER_SITE_OTHER): Payer: BC Managed Care – PPO | Admitting: Family Medicine

## 2021-12-19 ENCOUNTER — Ambulatory Visit (INDEPENDENT_AMBULATORY_CARE_PROVIDER_SITE_OTHER): Payer: BC Managed Care – PPO | Admitting: Plastic Surgery

## 2021-12-19 VITALS — BP 118/79 | HR 81 | Temp 98.1°F | Resp 18 | Ht 62.0 in | Wt 260.0 lb

## 2021-12-19 VITALS — BP 120/62 | HR 88 | Wt 259.0 lb

## 2021-12-19 DIAGNOSIS — M542 Cervicalgia: Secondary | ICD-10-CM

## 2021-12-19 DIAGNOSIS — Z6841 Body Mass Index (BMI) 40.0 and over, adult: Secondary | ICD-10-CM

## 2021-12-19 DIAGNOSIS — N62 Hypertrophy of breast: Secondary | ICD-10-CM | POA: Diagnosis not present

## 2021-12-19 DIAGNOSIS — R7303 Prediabetes: Secondary | ICD-10-CM

## 2021-12-19 DIAGNOSIS — N6002 Solitary cyst of left breast: Secondary | ICD-10-CM

## 2021-12-19 DIAGNOSIS — E282 Polycystic ovarian syndrome: Secondary | ICD-10-CM

## 2021-12-19 DIAGNOSIS — M546 Pain in thoracic spine: Secondary | ICD-10-CM

## 2021-12-19 DIAGNOSIS — N611 Abscess of the breast and nipple: Secondary | ICD-10-CM | POA: Diagnosis not present

## 2021-12-19 MED ORDER — MOUNJARO 2.5 MG/0.5ML ~~LOC~~ SOAJ: 2.5000 mg | SUBCUTANEOUS | 2 refills | Status: DC

## 2021-12-19 MED ORDER — MEDROXYPROGESTERONE ACETATE 10 MG PO TABS: 10.0000 mg | ORAL_TABLET | Freq: Every day | ORAL | 3 refills | Status: DC

## 2021-12-19 NOTE — Progress Notes (Signed)
Patient following up from PCOS. Patient did not have HSG done. Patient never had a period so she didn't call to schedule.

## 2021-12-19 NOTE — Progress Notes (Signed)
Referring Provider Carmie End, Jannette Fogo, NP [place],   [id number]   CC:  Chief Complaint  Patient presents with   Follow-up    consult      Peggy Garza is an 21 y.o. female.  HPI: Peggy Garza is a 21 year old female who presents today for reevaluation for a bilateral breast reduction.  She states that she continues to have upper back and neck pain due to the size of her breast.  She was seen earlier today for drainage of a cyst on her left breast.  She also has had an increase in her weight from 232 pounds in February to 260 pounds today.  Allergies  Allergen Reactions   Victoza [Liraglutide] Other (See Comments)    Patient notes side effects of sore throat and hoarseness, however nurse documentation reports that the patient stated that her throat was closing.  Patient never sought emergency care and never had problems breathing.    Outpatient Encounter Medications as of 12/19/2021  Medication Sig   acyclovir (ZOVIRAX) 200 MG/5ML suspension Take 800 mg by mouth as needed.   albuterol (VENTOLIN HFA) 108 (90 Base) MCG/ACT inhaler Inhale 2 puffs into the lungs every 6 (six) hours as needed for wheezing or shortness of breath (Cough).   BUTALBITAL-ACETAMINOPHEN PO Take 1 tablet by mouth See admin instructions. Take 1 tablet by mouth every 4 hours as needed.   cetirizine (ZYRTEC) 10 MG tablet Take 1 tablet by oral route for 30 days.   fluticasone (FLONASE) 50 MCG/ACT nasal spray Place 2 sprays into both nostrils daily.   ipratropium (ATROVENT) 0.06 % nasal spray Place 2 sprays into both nostrils 4 (four) times daily. As needed for nasal congestion, runny nose   ketoconazole (NIZORAL) 2 % shampoo WASH SCALP 3 TIMES WEEKLY AS MAINTENANCE   medroxyPROGESTERone (PROVERA) 10 MG tablet Take 1 tablet (10 mg total) by mouth daily. Take first 10 days of every month   ondansetron (ZOFRAN-ODT) 4 MG disintegrating tablet Take 1-2 tablets (4-8 mg total) by mouth every 8  (eight) hours as needed.   rizatriptan (MAXALT-MLT) 10 MG disintegrating tablet Take 1 tablet (10 mg total) by mouth as needed for migraine. May repeat in 2 hours if needed   tirzepatide [facility]) 2.5 MG/0.5ML Pen Inject 2.5 mg into the skin once a week.   topiramate (TOPAMAX) 50 MG tablet Take 0.5 tablets (25 mg total) by mouth 2 (two) times daily.   Vitamin D, Ergocalciferol, (DRISDOL) 1.25 MG (50000 UNIT) CAPS capsule Take 1 capsule (50,000 Units total) by mouth every 7 (seven) days.   No facility-administered encounter medications on file as of 12/19/2021.     Past Medical History:  Diagnosis Date   Genital herpes    Headache    Other fatigue    PCOS (polycystic ovarian syndrome)    Prediabetes    Shortness of breath on exertion     Past Surgical History:  Procedure Laterality Date   TONSILLECTOMY      Family History  Problem Relation Age of Onset   Hypertension Mother    Thyroid disease Mother    Sleep apnea Mother    Obesity Mother    Migraines Mother    Obesity Father    Sleep apnea Maternal Grandmother     Social History   Social History Narrative   Not on file     Review of Systems General: Denies fevers, chills, weight loss CV: Denies chest pain, shortness of breath, palpitations Breast:  Large breast causing upper back and neck pain as noted, cyst which has been drained on the left breast.  Physical Exam    12/19/2021   11:45 AM 12/19/2021    8:15 AM 12/04/2021    2:00 PM  Vitals with BMI  Height 5\' 2"   5\' 1"   Weight 260 lbs 259 lbs 257 lbs  BMI 47.54 48.96 48.58  Systolic 118 120  Diastolic 79 62 74  Pulse 81 88 89    General:  No acute distress,  Alert and oriented, Non-Toxic, Normal speech and affect No exam today Mammogram: Not applicable due to her age Assessment/Plan Macromastia: Given the patient's significant increase in weight, her BMI of 47.5, her open wound on her breast, I have not offered her breast reduction at this time.  Is  starting a new weight loss medication if it is approved by her insurance.  I would like to see her lose back down to at least the weight that she was at her initial evaluation.  I would also like to see the wound on her breast healed before we consider surgery.  She understands this and agrees.  She will follow-up with me in late December or early January.  January 12/19/2021, 1:41 PM

## 2021-12-19 NOTE — Progress Notes (Signed)
   Subjective:    Patient ID: Peggy Garza, female    DOB: 10-08-2000, 21 y.o.   MRN: [id number]  HPI Patient seen for follow up for PCOS. She has been trying to lose weight. No period in 3 months. She is on topamax 50mg  daily to help with weight loss. At this point, she hasn't lost any weight.   Review of Systems     Objective:  BP 120/62   Pulse 88   Wt 259 lb (117.5 kg)   BMI 48.94 kg/m    Physical Exam Vitals reviewed.  Constitutional:      Appearance: Normal appearance.  Cardiovascular:     Rate and Rhythm: Normal rate and regular rhythm.  Pulmonary:     Effort: Pulmonary effort is normal.     Breath sounds: Normal breath sounds.  Neurological:     Mental Status: She is alert.  Psychiatric:        Mood and Affect: Mood normal.        Behavior: Behavior normal.        Thought Content: Thought content normal.        Judgment: Judgment normal.        Assessment & Plan:  1. PCOS (polycystic ovarian syndrome) Oligomenorrhea - will do cyclical progesterone. Does not tolerate metformin due to GI side effects Unable to get [facility] due to access. Will try to get mounjaro approved  We discussed that both GLP1 and topamax are not approved during pregnancy. Advised against intentional pregnancy.   2. Prediabetes HgA1c 5.8 about 6 months ago.  3. Class 3 severe obesity with serious comorbidity and body mass index (BMI) of 45.0 to 49.9 in adult, unspecified obesity type (HCC)

## 2021-12-25 ENCOUNTER — Encounter: Payer: Self-pay | Admitting: Family Medicine

## 2022-01-07 ENCOUNTER — Ambulatory Visit (INDEPENDENT_AMBULATORY_CARE_PROVIDER_SITE_OTHER): Payer: BC Managed Care – PPO | Admitting: Family Medicine

## 2022-01-13 DIAGNOSIS — N611 Abscess of the breast and nipple: Secondary | ICD-10-CM | POA: Diagnosis not present

## 2022-01-24 ENCOUNTER — Other Ambulatory Visit (INDEPENDENT_AMBULATORY_CARE_PROVIDER_SITE_OTHER): Payer: Self-pay | Admitting: Family Medicine

## 2022-01-24 DIAGNOSIS — G43709 Chronic migraine without aura, not intractable, without status migrainosus: Secondary | ICD-10-CM

## 2022-01-29 DIAGNOSIS — Z113 Encounter for screening for infections with a predominantly sexual mode of transmission: Secondary | ICD-10-CM | POA: Diagnosis not present

## 2022-02-04 ENCOUNTER — Other Ambulatory Visit (INDEPENDENT_AMBULATORY_CARE_PROVIDER_SITE_OTHER): Payer: Self-pay | Admitting: Family Medicine

## 2022-02-04 DIAGNOSIS — E559 Vitamin D deficiency, unspecified: Secondary | ICD-10-CM

## 2022-02-12 ENCOUNTER — Ambulatory Visit: Payer: BC Managed Care – PPO | Admitting: Plastic Surgery

## 2022-02-19 ENCOUNTER — Encounter (INDEPENDENT_AMBULATORY_CARE_PROVIDER_SITE_OTHER): Payer: Self-pay | Admitting: Adult Health

## 2022-02-19 ENCOUNTER — Encounter: Payer: Self-pay | Admitting: Neurology

## 2022-02-20 ENCOUNTER — Encounter (INDEPENDENT_AMBULATORY_CARE_PROVIDER_SITE_OTHER): Payer: Self-pay | Admitting: Adult Health

## 2022-03-05 DIAGNOSIS — N898 Other specified noninflammatory disorders of vagina: Secondary | ICD-10-CM | POA: Diagnosis not present

## 2022-03-10 ENCOUNTER — Other Ambulatory Visit: Payer: Self-pay | Admitting: General Surgery

## 2022-03-10 DIAGNOSIS — N611 Abscess of the breast and nipple: Secondary | ICD-10-CM | POA: Diagnosis not present

## 2022-03-25 ENCOUNTER — Encounter (HOSPITAL_COMMUNITY): Payer: Self-pay | Admitting: General Surgery

## 2022-03-25 ENCOUNTER — Other Ambulatory Visit: Payer: Self-pay

## 2022-03-25 NOTE — Progress Notes (Signed)
Peggy Peggy Garza denies chest pain or shortness of breath.  Patient denies having any s/s of Covid in her household, also denies any known exposure to Covid.   Peggy Garza's PCP is Dr. Corlis Hove, patient was seen at [place].  Patient is a patient at [facility], patient was taking Mounjaro, patient report that she was doing well and then the doseage was increased, "it made me sick", per patient.

## 2022-03-26 NOTE — Anesthesia Preprocedure Evaluation (Addendum)
Anesthesia Evaluation  Patient identified by MRN, date of birth, ID band Patient awake    Reviewed: Allergy & Precautions, NPO status , Patient's Chart, lab work & pertinent test results  History of Anesthesia Complications Negative for: history of anesthetic complications  Airway Mallampati: III  TM Distance: >3 FB Neck ROM: Full    Dental  (+) Teeth Intact, Dental Advisory Given   Pulmonary neg shortness of breath, neg sleep apnea, neg COPD, neg recent URI, Patient abstained from smoking.   breath sounds clear to auscultation       Cardiovascular negative cardio ROS  Rhythm:Regular     Neuro/Psych  Headaches PSYCHIATRIC DISORDERS Anxiety Depression       GI/Hepatic negative GI ROS, Neg liver ROS,,,  Endo/Other    Morbid obesity (BMI 47.5)  Renal/GU Lab Results      Component                Value               Date                      CREATININE               0.97                06/12/2021                Musculoskeletal negative musculoskeletal ROS (+)    Abdominal   Peds  Hematology negative hematology ROS (+) Lab Results      Component                Value               Date                      WBC                      7.9                 03/27/2022                HGB                      12.7                03/27/2022                HCT                      40.1                03/27/2022                MCV                      85.3                03/27/2022                PLT                      233                 03/27/2022              Anesthesia Other Findings All: victoza  Reproductive/Obstetrics Lab Results      Component                Value               Date                      PREGTESTUR               NEGATIVE            03/27/2022                                        Anesthesia Physical Anesthesia Plan  ASA: 3  Anesthesia Plan: General   Post-op Pain  Management: Ketamine IV*, Toradol IV (intra-op)* and Tylenol PO (pre-op)*   Induction: Intravenous  PONV Risk Score and Plan: 4 or greater and Treatment may vary due to age or medical condition, Midazolam, Ondansetron and Dexamethasone  Airway Management Planned: Oral ETT  Additional Equipment: None  Intra-op Plan:   Post-operative Plan: Extubation in OR  Informed Consent:      Dental advisory given  Plan Discussed with: CRNA and Anesthesiologist  Anesthesia Plan Comments:         Anesthesia Quick Evaluation

## 2022-03-27 ENCOUNTER — Ambulatory Visit (HOSPITAL_COMMUNITY)
Admission: RE | Admit: 2022-03-27 | Discharge: 2022-03-27 | Disposition: A | Discharge status: Home / Self Care | Payer: BC Managed Care – PPO | Attending: General Surgery | Admitting: General Surgery

## 2022-03-27 ENCOUNTER — Encounter (HOSPITAL_COMMUNITY)
Admission: RE | Disposition: A | Discharge status: Home / Self Care | Payer: Self-pay | Source: Home / Self Care | Attending: General Surgery

## 2022-03-27 ENCOUNTER — Other Ambulatory Visit: Payer: Self-pay

## 2022-03-27 ENCOUNTER — Ambulatory Visit (HOSPITAL_COMMUNITY): Payer: BC Managed Care – PPO | Admitting: Anesthesiology

## 2022-03-27 ENCOUNTER — Encounter (HOSPITAL_COMMUNITY): Payer: Self-pay | Admitting: General Surgery

## 2022-03-27 DIAGNOSIS — L723 Sebaceous cyst: Secondary | ICD-10-CM | POA: Insufficient documentation

## 2022-03-27 DIAGNOSIS — F418 Other specified anxiety disorders: Secondary | ICD-10-CM | POA: Insufficient documentation

## 2022-03-27 DIAGNOSIS — Z6841 Body Mass Index (BMI) 40.0 and over, adult: Secondary | ICD-10-CM | POA: Diagnosis not present

## 2022-03-27 DIAGNOSIS — N611 Abscess of the breast and nipple: Secondary | ICD-10-CM | POA: Insufficient documentation

## 2022-03-27 HISTORY — PX: BREAST CYST EXCISION: SHX579

## 2022-03-27 HISTORY — DX: Prediabetes: R73.03

## 2022-03-27 LAB — CBC
HCT: 40.1 % (ref 36.0–46.0)
Hemoglobin: 12.7 g/dL (ref 12.0–15.0)
MCH: 27 pg (ref 26.0–34.0)
MCHC: 31.7 g/dL (ref 30.0–36.0)
MCV: 85.3 fL (ref 80.0–100.0)
Platelets: 233 10*3/uL (ref 150–400)
RBC: 4.7 MIL/uL (ref 3.87–5.11)
RDW: 13.6 % (ref 11.5–15.5)
WBC: 7.9 10*3/uL (ref 4.0–10.5)
nRBC: 0 % (ref 0.0–0.2)

## 2022-03-27 LAB — POCT PREGNANCY, URINE: Preg Test, Ur: NEGATIVE

## 2022-03-27 SURGERY — EXCISION, CYST, BREAST
Anesthesia: General | Site: Breast | Laterality: Bilateral

## 2022-03-27 MED ORDER — CHLORHEXIDINE GLUCONATE CLOTH 2 % EX PADS: 6.0000 | MEDICATED_PAD | Freq: Once | CUTANEOUS | Status: DC

## 2022-03-27 MED ORDER — OXYCODONE HCL 5 MG/5ML PO SOLN
ORAL | Status: AC
  Filled 2022-03-27: qty 5

## 2022-03-27 MED ORDER — HYDROMORPHONE HCL 1 MG/ML IJ SOLN
INTRAMUSCULAR | Status: AC
  Filled 2022-03-27: qty 1

## 2022-03-27 MED ORDER — PHENYLEPHRINE 80 MCG/ML (10ML) SYRINGE FOR IV PUSH (FOR BLOOD PRESSURE SUPPORT)
PREFILLED_SYRINGE | INTRAVENOUS | Status: AC
  Filled 2022-03-27: qty 10

## 2022-03-27 MED ORDER — DEXAMETHASONE SODIUM PHOSPHATE 10 MG/ML IJ SOLN
INTRAMUSCULAR | Status: DC | PRN
  Administered 2022-03-27: 10 mg via INTRAVENOUS

## 2022-03-27 MED ORDER — MIDAZOLAM HCL 2 MG/2ML IJ SOLN
INTRAMUSCULAR | Status: DC | PRN
  Administered 2022-03-27: 2 mg via INTRAVENOUS

## 2022-03-27 MED ORDER — CHLORHEXIDINE GLUCONATE 0.12 % MT SOLN
15.0000 mL | Freq: Once | OROMUCOSAL | Status: AC
  Administered 2022-03-27: 15 mL via OROMUCOSAL
  Filled 2022-03-27: qty 15

## 2022-03-27 MED ORDER — ONDANSETRON HCL 4 MG/2ML IJ SOLN
4.0000 mg | Freq: Once | INTRAMUSCULAR | Status: AC | PRN
  Administered 2022-03-27: 4 mg via INTRAVENOUS

## 2022-03-27 MED ORDER — PROPOFOL 10 MG/ML IV BOLUS
INTRAVENOUS | Status: AC
  Filled 2022-03-27: qty 20

## 2022-03-27 MED ORDER — LIDOCAINE 2% (20 MG/ML) 5 ML SYRINGE
INTRAMUSCULAR | Status: DC | PRN
  Administered 2022-03-27: 80 mg via INTRAVENOUS

## 2022-03-27 MED ORDER — OXYCODONE HCL 5 MG PO TABS: 5.0000 mg | ORAL_TABLET | Freq: Once | ORAL | Status: AC | PRN

## 2022-03-27 MED ORDER — ACETAMINOPHEN 500 MG PO TABS
1000.0000 mg | ORAL_TABLET | ORAL | Status: AC
  Administered 2022-03-27: 1000 mg via ORAL
  Filled 2022-03-27: qty 2

## 2022-03-27 MED ORDER — ORAL CARE MOUTH RINSE: 15.0000 mL | Freq: Once | OROMUCOSAL | Status: AC

## 2022-03-27 MED ORDER — KETOROLAC TROMETHAMINE 30 MG/ML IJ SOLN
INTRAMUSCULAR | Status: AC
  Filled 2022-03-27: qty 1

## 2022-03-27 MED ORDER — LIDOCAINE-EPINEPHRINE 1 %-1:100000 IJ SOLN
INTRAMUSCULAR | Status: DC | PRN
  Administered 2022-03-27: 40 mL via SURGICAL_CAVITY

## 2022-03-27 MED ORDER — KETOROLAC TROMETHAMINE 30 MG/ML IJ SOLN
30.0000 mg | Freq: Once | INTRAMUSCULAR | Status: AC | PRN
  Administered 2022-03-27: 30 mg via INTRAVENOUS

## 2022-03-27 MED ORDER — CEFAZOLIN SODIUM-DEXTROSE 2-4 GM/100ML-% IV SOLN
2.0000 g | INTRAVENOUS | Status: AC
  Administered 2022-03-27: 2 g via INTRAVENOUS
  Filled 2022-03-27: qty 100

## 2022-03-27 MED ORDER — OXYCODONE HCL 5 MG/5ML PO SOLN
5.0000 mg | Freq: Once | ORAL | Status: AC | PRN
  Administered 2022-03-27: 5 mg via ORAL

## 2022-03-27 MED ORDER — MIDAZOLAM HCL 2 MG/2ML IJ SOLN
INTRAMUSCULAR | Status: AC
  Filled 2022-03-27: qty 2

## 2022-03-27 MED ORDER — BUPIVACAINE HCL (PF) 0.25 % IJ SOLN
INTRAMUSCULAR | Status: AC
  Filled 2022-03-27: qty 30

## 2022-03-27 MED ORDER — LIDOCAINE-EPINEPHRINE 1 %-1:100000 IJ SOLN
INTRAMUSCULAR | Status: AC
  Filled 2022-03-27: qty 1

## 2022-03-27 MED ORDER — FENTANYL CITRATE (PF) 250 MCG/5ML IJ SOLN
INTRAMUSCULAR | Status: DC | PRN
  Administered 2022-03-27: 100 ug via INTRAVENOUS
  Administered 2022-03-27 (×3): 50 ug via INTRAVENOUS

## 2022-03-27 MED ORDER — LACTATED RINGERS IV SOLN: INTRAVENOUS | Status: DC

## 2022-03-27 MED ORDER — ONDANSETRON HCL 4 MG/2ML IJ SOLN
INTRAMUSCULAR | Status: DC | PRN
  Administered 2022-03-27: 4 mg via INTRAVENOUS

## 2022-03-27 MED ORDER — PHENYLEPHRINE 80 MCG/ML (10ML) SYRINGE FOR IV PUSH (FOR BLOOD PRESSURE SUPPORT)
PREFILLED_SYRINGE | INTRAVENOUS | Status: DC | PRN
  Administered 2022-03-27: 80 ug via INTRAVENOUS

## 2022-03-27 MED ORDER — SUGAMMADEX SODIUM 200 MG/2ML IV SOLN
INTRAVENOUS | Status: DC | PRN
  Administered 2022-03-27: 200 mg via INTRAVENOUS

## 2022-03-27 MED ORDER — OXYCODONE HCL 5 MG PO TABS: 5.0000 mg | ORAL_TABLET | Freq: Four times a day (QID) | ORAL | 0 refills | Status: DC | PRN

## 2022-03-27 MED ORDER — FENTANYL CITRATE (PF) 250 MCG/5ML IJ SOLN
INTRAMUSCULAR | Status: AC
  Filled 2022-03-27: qty 5

## 2022-03-27 MED ORDER — HYDROMORPHONE HCL 1 MG/ML IJ SOLN
0.2500 mg | INTRAMUSCULAR | Status: DC | PRN
  Administered 2022-03-27: 0.5 mg via INTRAVENOUS

## 2022-03-27 MED ORDER — ONDANSETRON HCL 4 MG/2ML IJ SOLN
INTRAMUSCULAR | Status: AC
  Filled 2022-03-27: qty 2

## 2022-03-27 MED ORDER — PROPOFOL 10 MG/ML IV BOLUS
INTRAVENOUS | Status: DC | PRN
  Administered 2022-03-27: 140 mg via INTRAVENOUS

## 2022-03-27 MED ORDER — ROCURONIUM BROMIDE 10 MG/ML (PF) SYRINGE
PREFILLED_SYRINGE | INTRAVENOUS | Status: DC | PRN
  Administered 2022-03-27: 80 mg via INTRAVENOUS

## 2022-03-27 SURGICAL SUPPLY — 44 items
BAG COUNTER SPONGE SURGICOUNT (BAG) ×1 IMPLANT
BINDER BREAST 3XL (GAUZE/BANDAGES/DRESSINGS) IMPLANT
BINDER BREAST LRG (GAUZE/BANDAGES/DRESSINGS) IMPLANT
BINDER BREAST XLRG (GAUZE/BANDAGES/DRESSINGS) IMPLANT
BLADE SURG 10 STRL SS (BLADE) ×1 IMPLANT
CANISTER SUCT 3000ML PPV (MISCELLANEOUS) IMPLANT
CHLORAPREP W/TINT 26 (MISCELLANEOUS) ×1 IMPLANT
CLIP VESOCCLUDE LG 6/CT (CLIP) ×1 IMPLANT
COVER PROBE W GEL 5X96 (DRAPES) ×1 IMPLANT
COVER SURGICAL LIGHT HANDLE (MISCELLANEOUS) ×1 IMPLANT
DERMABOND ADVANCED .7 DNX12 (GAUZE/BANDAGES/DRESSINGS) ×1 IMPLANT
DERMABOND ADVANCED .7 DNX6 (GAUZE/BANDAGES/DRESSINGS) IMPLANT
DEVICE DUBIN SPECIMEN MAMMOGRA (MISCELLANEOUS) ×1 IMPLANT
DRAPE CHEST BREAST 15X10 FENES (DRAPES) ×1 IMPLANT
ELECT COATED BLADE 2.86 ST (ELECTRODE) ×1 IMPLANT
ELECT REM PT RETURN 9FT ADLT (ELECTROSURGICAL) ×1
ELECTRODE REM PT RTRN 9FT ADLT (ELECTROSURGICAL) ×1 IMPLANT
GAUZE PAD ABD 7.5X8 STRL (GAUZE/BANDAGES/DRESSINGS) IMPLANT
GAUZE PAD ABD 8X10 STRL (GAUZE/BANDAGES/DRESSINGS) ×1 IMPLANT
GAUZE SPONGE 4X4 12PLY STRL (GAUZE/BANDAGES/DRESSINGS) IMPLANT
GAUZE SPONGE 4X4 12PLY STRL LF (GAUZE/BANDAGES/DRESSINGS) ×1 IMPLANT
GLOVE BIO SURGEON STRL SZ 6 (GLOVE) ×1 IMPLANT
GLOVE INDICATOR 6.5 STRL GRN (GLOVE) ×1 IMPLANT
GOWN STRL REUS W/ TWL LRG LVL3 (GOWN DISPOSABLE) ×1 IMPLANT
GOWN STRL REUS W/ TWL XL LVL3 (GOWN DISPOSABLE) ×1 IMPLANT
GOWN STRL REUS W/TWL LRG LVL3 (GOWN DISPOSABLE) ×1
GOWN STRL REUS W/TWL XL LVL3 (GOWN DISPOSABLE) ×1
KIT BASIN OR (CUSTOM PROCEDURE TRAY) ×1 IMPLANT
KIT MARKER MARGIN INK (KITS) ×1 IMPLANT
LIGHT WAVEGUIDE WIDE FLAT (MISCELLANEOUS) IMPLANT
NDL HYPO 25GX1X1/2 BEV (NEEDLE) ×1 IMPLANT
NEEDLE HYPO 25GX1X1/2 BEV (NEEDLE) ×1 IMPLANT
NS IRRIG 1000ML POUR BTL (IV SOLUTION) IMPLANT
PACK GENERAL/GYN (CUSTOM PROCEDURE TRAY) ×1 IMPLANT
STRIP CLOSURE SKIN 1/2X4 (GAUZE/BANDAGES/DRESSINGS) ×1 IMPLANT
SUT MNCRL AB 4-0 PS2 18 (SUTURE) ×1 IMPLANT
SUT SILK 2 0 SH (SUTURE) IMPLANT
SUT VIC AB 2-0 SH 27 (SUTURE) ×3
SUT VIC AB 2-0 SH 27XBRD (SUTURE) ×1 IMPLANT
SUT VIC AB 3-0 SH 27 (SUTURE) ×3
SUT VIC AB 3-0 SH 27X BRD (SUTURE) ×1 IMPLANT
SYR CONTROL 10ML LL (SYRINGE) ×1 IMPLANT
TOWEL GREEN STERILE (TOWEL DISPOSABLE) ×1 IMPLANT
TOWEL GREEN STERILE FF (TOWEL DISPOSABLE) ×1 IMPLANT

## 2022-03-27 NOTE — Op Note (Signed)
PRE-OPERATIVE DIAGNOSIS: bilateral chronic breast abscesses with sinus tracts  POST-OPERATIVE DIAGNOSIS:  Same  PROCEDURE:  Procedure(s): Excision of bilateral chronic breast abscesses  SURGEON:  Surgeon(s): Stark Klein, MD  ASSIST:  Celene Squibb, RNFA  ANESTHESIA:   local and general  DRAINS: none   LOCAL MEDICATIONS USED:  BUPIVICAINE  and LIDOCAINE   SPECIMEN:  Source of Specimen:  right breast tissue, left breast tissue  DISPOSITION OF SPECIMEN:  PATHOLOGY  COUNTS:  YES  DICTATION: .Dragon Dictation  PLAN OF CARE: Discharge to home after PACU  PATIENT DISPOSITION:  PACU - hemodynamically stable.  FINDINGS:  temporarily resolved infection   EBL: min  PROCEDURE:   Patient was identified in the holding area.  Prior to any sedation, the areas of concern on her breasts were identified and marked.  No active infection was seen.    Patient was then brought back to the operating room placed supine on the operating room table.  General anesthesia was induced after sequential compression devices were placed.  Both breasts were prepped and draped in sterile fashion.  A timeout was performed according to the surgical safety checklist.  When all is correct, we continued.  The right breast was addressed first.  A crescent shaped incision was drawn out incorporating the medial sinus tracts and underlying breast tissue.  Incision was made with a #10 blade.  Skin hooks were used to elevate the skin edges and the tissue just lateral was taken with the cautery.  Similarly, the tissue adjacent to the sinus tracts was taken medially.  This was elevated and removed with the crescent of skin and sinus tracts attached.  Hemostasis was achieved with cautery.  The cavity was irrigated.  Local was administered along the skin edges.  The deeper tissues were reapproximated with interrupted 2-0 Monocryl.  The skin was then reapproximated with interrupted 3-0 Vicryl deep dermal sutures and running  4-0 Monocryl subcuticular suture.  The left breast was addressed similarly.    The skin was then cleaned, dried, and dressed with Dermabond, Steri-Strips, gauze, ABDs, and a breast binder.  The patient was allowed to emerge from anesthesia.  She was taken to the PACU in stable condition.  Needle, sponge, and instrument counts were correct per protocol.

## 2022-03-27 NOTE — Transfer of Care (Signed)
Immediate Anesthesia Transfer of Care Note  Patient: Peggy Garza  Procedure(s) Performed: EXCISION OF BILATERAL BREAST CHRONIC ABSCESS (Bilateral: Breast)  Patient Location: PACU  Anesthesia Type:General  Level of Consciousness: drowsy and patient cooperative  Airway & Oxygen Therapy: Patient Spontanous Breathing and Patient connected to nasal cannula oxygen  Post-op Assessment: Report given to RN, Post -op Vital signs reviewed and stable, and Patient moving all extremities X 4  Post vital signs: Reviewed and stable  Last Vitals:  Vitals Value Taken Time  BP 129/75 03/27/22 1212  Temp    Pulse 67 03/27/22 1215  Resp 11 03/27/22 1215  SpO2 94 % 03/27/22 1215  Vitals shown include unvalidated device data.  Last Pain:  Vitals:   03/27/22 0830  TempSrc: Oral         Complications: No notable events documented.

## 2022-03-27 NOTE — H&P (Signed)
PROVIDER: Georgianne Fick, MD Patient Care Team: Geryl Councilman, [place] as PCP - General  MRN: [id number] DOB: [birth year] DATE OF ENCOUNTER: 03/10/2022  Chief Complaint: Follow-up (RE-CHECK - L breast cyst/abscess exc and possible R breast cyst/abscess )   History of Present Illness: Peggy Garza is a 22 y.o. female who is seen today for follow up of breast infection.  Initial history:  Peggy Garza was evaluated in February 2022 for likely infected sebaceous cysts on bilateral breasts. She initially underwent right breast US in July 2021 for a palpable lump in the outer breast, which showed a small skin abscess and eventually resolved. She had bilateral US in Dec and Jan showing sebaceous cyst/skin abscesses on both breasts. Last [place] was 03/06/2020. Puja saw her in August 2022 for a flare on her left breast. At that time I prescribed doxycycline and recommended follow-up with Dr. Barry Dienes in about 6 weeks, but she did not follow-up.  She presented to the office on 12/19/21 with a recurrent flare of the LEFT breast abscess x 3 to 4 days. She underwent I&D releasing a small amount of thick, purulent fluid. Puja saw her on 12/26/2021 at which time she was doing well.  She is presenting to the urgent office again 01/13/2022 with concerns of another flare on her RIGHT breast. She had not had a flare on this side in a while. She hadn't had any drainage but stated that she was not able to sleep at night due to discomfort. No fever.  Interval history:  Patient returns today to discuss care of these 2 areas. She states that these are always the same locations on the left on the right. They are both in the lower inner quadrant but not on the inframammary fold. She currently is not having any pain or inflammation. These have both become infected multiple times in the last 2 years.   Review of Systems: A complete review of systems was obtained from the patient. I have reviewed this information and  discussed as appropriate with the patient. See HPI as well for other ROS.  Review of Systems All other systems reviewed and are negative.   Medical History: No past medical history on file.  Patient Active Problem List Diagnosis Breast cyst, unspecified laterality Chronic abscess of breast  No past surgical history on file.  Allergies Allergen Reactions Liraglutide Other (See Comments) Patient notes side effects of sore throat and hoarseness, however nurse documentation reports that the patient stated that her throat was closing. Patient never sought emergency care and never had problems breathing.  Current Outpatient Medications on File Prior to Visit Medication Sig Dispense Refill ergocalciferol, vitamin D2, 1,250 mcg (50,000 unit) capsule Take 50,000 Units by mouth every 7 (seven) days liraglutide (VICTOZA) 0.6 mg/0.1 mL (18 mg/3 mL) pen injector Inject 0.6 mg subcutaneously once daily (Patient not taking: Reported on 01/13/2022) metFORMIN (GLUCOPHAGE-XR) 500 MG XR tablet (Patient not taking: Reported on 01/13/2022)  No current facility-administered medications on file prior to visit.  No family history on file.  Social History  Tobacco Use Smoking Status Never Smokeless Tobacco Never   Social History  Socioeconomic History Marital status: Single Tobacco Use Smoking status: Never Smokeless tobacco: Never  Objective:  There were no vitals filed for this visit. There is no height or weight on file to calculate BMI.  Head: Normocephalic and atraumatic. Eyes: Conjunctivae are normal. Pupils are equal, round, and reactive to light. No scleral icterus. Neck: Normal range of motion. Neck supple. No tracheal  deviation present. No thyromegaly present. Resp: No respiratory distress, normal effort. Breast: ptotic bilaterally. No palpable breast masses. At 4 o'clock on the right there is a small incision and irritated region. No erythema. A bit firm from scarring and  chronic inflammation. At 8 o'clock on the left there is a similar lesion Abd: Abdomen is soft, non distended and non tender. No masses are palpable. There is no rebound and no guarding. Neurological: Alert and oriented to person, place, and time. Coordination normal. Skin: Skin is warm and dry. No rash noted. No diaphoretic. No erythema. No pallor. Psychiatric: Normal mood and affect. Normal behavior. Judgment and thought content normal.  Labs, Imaging and Diagnostic Testing:  N/a  Assessment and Plan:  Diagnoses and all orders for this visit:  Chronic abscess of breast   These areas are very small, but they have both become infected at least 5 times over the last 2 to 3 years. Sometimes they require significant interaction in her daily life because she has to come get incision and drainage or antibiotics. Sometimes they resolve on their own. We will plan to excise both of these regions and a ellipse. I discussed that I would take out the skin that has been involved as well as the tissue underneath. This would be a small volume of breast tissue. I discussed where the scars would be. I reviewed risks of surgery which principally are infection and wound breakdown. I reviewed other risks such as bleeding, pain, heart or lung issues, blood clot. The patient desires to proceed.

## 2022-03-27 NOTE — Interval H&P Note (Signed)
History and Physical Interval Note:  03/27/2022 9:30 AM  Peggy Garza  has presented today for surgery, with the diagnosis of BILATERAL BREAST CHRONIC ABSCESS.  The various methods of treatment have been discussed with the patient and family. After consideration of risks, benefits and other options for treatment, the patient has consented to  Procedure(s): EXCISION OF BILATERAL BREAST CHRONIC ABSCESS (Bilateral) as a surgical intervention.  The patient's history has been reviewed, patient examined, no change in status, stable for surgery.  I have reviewed the patient's chart and labs.  Questions were answered to the patient's satisfaction.     Stark Klein

## 2022-03-27 NOTE — Anesthesia Procedure Notes (Addendum)
Procedure Name: Intubation Date/Time: 03/27/2022 11:03 AM  Performed by: Darletta Moll, CRNAPre-anesthesia Checklist: Patient identified, Emergency Drugs available, Suction available and Patient being monitored Patient Re-evaluated:Patient Re-evaluated prior to induction Oxygen Delivery Method: Circle system utilized Preoxygenation: Pre-oxygenation with 100% oxygen Induction Type: IV induction Ventilation: Mask ventilation without difficulty and Oral airway inserted - appropriate to patient size Laryngoscope Size: Mac and 3 Grade View: Grade I Tube type: Oral Tube size: 7.0 mm Number of attempts: 1 Airway Equipment and Method: Stylet Placement Confirmation: ETT inserted through vocal cords under direct vision, positive ETCO2 and breath sounds checked- equal and bilateral Secured at: 21 cm Tube secured with: Tape Dental Injury: Teeth and Oropharynx as per pre-operative assessment

## 2022-03-27 NOTE — Discharge Instructions (Addendum)
[facility] Office Phone Number [phone]  BREAST BIOPSY/ PARTIAL MASTECTOMY: POST OP INSTRUCTIONS  Always review your discharge instruction sheet given to you by the facility where your surgery was performed.  IF YOU HAVE DISABILITY OR FAMILY LEAVE FORMS, YOU MUST BRING THEM TO THE OFFICE FOR PROCESSING.  DO NOT GIVE THEM TO YOUR DOCTOR.  Take 2 tylenol (acetominophen) three times a day for 3 days.  If you still have pain, add ibuprofen with food in between if able to take this (if you have kidney issues or stomach issues, do not take ibuprofen).  If both of those are not enough, add the narcotic pain pill.  If you find you are needing a lot of this overnight after surgery, call the next morning for a refill.    Prescriptions will not be filled after 5pm or on week-ends. Take your usually prescribed medications unless otherwise directed You should eat very light the first 24 hours after surgery, such as soup, crackers, pudding, etc.  Resume your normal diet the day after surgery. Most patients will experience some swelling and bruising in the breast.  Ice packs and a good support bra will help.  Swelling and bruising can take several days to resolve.  It is common to experience some constipation if taking pain medication after surgery.  Increasing fluid intake and taking a stool softener will usually help or prevent this problem from occurring.  A mild laxative (Milk of Magnesia or Miralax) should be taken according to package directions if there are no bowel movements after 48 hours. Unless discharge instructions indicate otherwise, you may remove your bandages 48 hours after surgery, and you may shower at that time.  You may have steri-strips (small skin tapes) in place directly over the incision.  These strips should be left on the skin at least for for 7-10 days.    ACTIVITIES:  You may resume regular daily activities (gradually increasing) beginning the next day.  Wearing a  good support bra or sports bra (or the breast binder) minimizes pain and swelling.  You may have sexual intercourse when it is comfortable. No heavy lifting for 1-2 weeks (not over around 10 pounds).  You may drive when you no longer are taking prescription pain medication, you can comfortably wear a seatbelt, and you can safely maneuver your car and apply brakes. RETURN TO WORK:  __________3-14 days depending on job. _______________ You should see your doctor in the office for a follow-up appointment approximately two weeks after your surgery.  Your doctor's nurse will typically make your follow-up appointment when she calls you with your pathology report.  Expect your pathology report 3-4 business days after your surgery.  You may call to check if you do not hear from us after three days.   WHEN TO CALL YOUR DOCTOR: Fever over 101.0 Nausea and/or vomiting. Extreme swelling or bruising. Continued bleeding from incision. Increased pain, redness, or drainage from the incision.  The clinic staff is available to answer your questions during regular business hours.  Please don't hesitate to call and ask to speak to one of the nurses for clinical concerns.  If you have a medical emergency, go to the nearest emergency room or call 911.  A surgeon from [facility] is always on call at the hospital.  For further questions, please visit [email or web address]   

## 2022-03-28 ENCOUNTER — Encounter (HOSPITAL_COMMUNITY): Payer: Self-pay | Admitting: General Surgery

## 2022-03-28 LAB — SURGICAL PATHOLOGY

## 2022-03-29 NOTE — Anesthesia Postprocedure Evaluation (Signed)
Anesthesia Post Note  Patient: Peggy Garza  Procedure(s) Performed: EXCISION OF BILATERAL BREAST CHRONIC ABSCESS (Bilateral: Breast)     Patient location during evaluation: PACU Anesthesia Type: General Level of consciousness: awake and alert Pain management: pain level controlled Vital Signs Assessment: post-procedure vital signs reviewed and stable Respiratory status: spontaneous breathing, nonlabored ventilation and respiratory function stable Cardiovascular status: blood pressure returned to baseline and stable Postop Assessment: no apparent nausea or vomiting Anesthetic complications: no   No notable events documented.  Last Vitals:  Vitals:   03/27/22 1345 03/27/22 1400  BP: 109/70 131/72  Pulse: 80 93  Resp: 18 17  Temp:  36.8 C  SpO2: 93% 97%    Last Pain:  Vitals:   03/27/22 1400  TempSrc:   PainSc: 0-No pain                 Coriann Brouhard

## 2022-04-28 ENCOUNTER — Other Ambulatory Visit (HOSPITAL_COMMUNITY): Payer: Self-pay

## 2022-04-29 ENCOUNTER — Other Ambulatory Visit (HOSPITAL_COMMUNITY): Payer: Self-pay

## 2022-04-29 MED ORDER — SEMAGLUTIDE-WEIGHT MANAGEMENT 0.25 MG/0.5ML ~~LOC~~ SOAJ
0.2500 mg | SUBCUTANEOUS | 0 refills | Status: DC
  Filled 2022-04-29: qty 2, 28d supply, fill #0

## 2022-05-01 ENCOUNTER — Other Ambulatory Visit (HOSPITAL_COMMUNITY): Payer: Self-pay

## 2022-05-08 ENCOUNTER — Encounter: Payer: Self-pay | Admitting: Family Medicine

## 2022-05-08 DIAGNOSIS — E282 Polycystic ovarian syndrome: Secondary | ICD-10-CM

## 2022-05-08 DIAGNOSIS — N979 Female infertility, unspecified: Secondary | ICD-10-CM

## 2022-05-13 ENCOUNTER — Other Ambulatory Visit: Payer: Self-pay

## 2022-05-15 ENCOUNTER — Other Ambulatory Visit: Payer: Self-pay

## 2022-05-16 ENCOUNTER — Other Ambulatory Visit (HOSPITAL_COMMUNITY): Payer: Self-pay

## 2022-05-21 MED ORDER — SAXENDA 18 MG/3ML ~~LOC~~ SOPN
0.6000 mg | PEN_INJECTOR | Freq: Every day | SUBCUTANEOUS | 1 refills | Status: DC
  Filled 2022-05-27: qty 3, 30d supply, fill #0

## 2022-05-27 ENCOUNTER — Other Ambulatory Visit (HOSPITAL_COMMUNITY): Payer: Self-pay

## 2022-05-27 ENCOUNTER — Other Ambulatory Visit: Payer: Self-pay

## 2022-06-10 ENCOUNTER — Other Ambulatory Visit (HOSPITAL_COMMUNITY): Payer: Self-pay

## 2022-06-18 ENCOUNTER — Other Ambulatory Visit (HOSPITAL_COMMUNITY): Payer: Self-pay

## 2022-06-20 NOTE — Addendum Note (Signed)
Addended by: Levie Heritage on: 06/20/2022 09:10 AM   Modules accepted: Orders

## 2022-06-24 ENCOUNTER — Other Ambulatory Visit (HOSPITAL_COMMUNITY): Payer: Self-pay

## 2022-06-24 NOTE — Progress Notes (Deleted)
PATIENT: Peggy Garza DOB: Jan 23, 2001  REASON FOR VISIT: follow up HISTORY FROM: patient  No chief complaint on file.    HISTORY OF PRESENT ILLNESS:  06/24/22 ALL:  Peggy Garza is a 22 y.o. female here today for follow up for OSA on CPAP and migraines.  She was last seen by Dr Lucia Gaskins 10/2020. Topiramate continued and sleep referral placed. She was seen in consult with Dr Frances Furbish 02/2021 for excessive daytime sleepiness, snoring and non restorative sleep. HST was ordered     HISTORY: (copied from Dr Teofilo Pod previous note)  Dear Desma Maxim,   I saw your patient, Peggy Garza, upon your kind request in my sleep clinic today for initial consultation of her sleep disorder, in particular, concern for underlying obstructive sleep apnea.  The patient is unaccompanied today. She missed an appointment on 12/11/20.  As you know, Peggy Garza is a 22 year old female with an underlying medical history of PCOS, prediabetes, recurrent headaches and severe obesity with a BMI of over 40, who reports snoring and excessive daytime somnolence as well as nonrestorative sleep and morning headaches.  I reviewed your office note from 10/23/2020.  Her Epworth sleepiness score is 15 out of 24, fatigue severity score is 24 out of 63.  She reports a family history of sleep apnea, her mom and maternal grandmother have sleep apnea and both have CPAP machines.  She would be willing to consider CPAP but is worried about sleeping with the mask on.  She is working on weight loss and is followed by medical weight management.  She has lost about 20 pounds in about 6 months.  Her headaches have improved since she started topiramate.  She started a second job some 6 months ago.  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  Her sleep schedule is disrupted.  She goes to bed generally right after 6 PM until 11 PM and takes a nap before starting her day  job, she may nap from 5 AM until 9 AM approximately.  She is a non-smoker and does not drink caffeine daily.  She drinks alcohol on the weekends.  She is single and lives alone, no pets in the house.  She does have a TV of in her bedroom at night or in the mornings when she sleeps.  She feels that this is the only way she can fall asleep.  She had a tonsillectomy as a child.   REVIEW OF SYSTEMS: Out of a complete 14 system review of symptoms, the patient complains only of the following symptoms, and all other reviewed systems are negative.  ESS: previously 15/24  ALLERGIES: Allergies  Allergen Reactions   Victoza [Liraglutide] Other (See Comments)    Patient notes side effects of sore throat and hoarseness, however nurse documentation reports that the patient stated that her throat was closing.  Patient never sought emergency care and never had problems breathing.    HOME MEDICATIONS: Outpatient Medications Prior to Visit  Medication Sig Dispense Refill   acyclovir (ZOVIRAX) 200 MG/5ML suspension Take 800 mg by mouth 3 (three) times daily as needed (Outbreak).     albuterol (VENTOLIN HFA) 108 (90 Base) MCG/ACT inhaler Inhale 2 puffs into the lungs every 6 (six) hours as needed for wheezing or shortness of breath (Cough). 18 g 0   cetirizine (ZYRTEC) 10 MG tablet Take 10 mg by mouth daily as needed for allergies.  fluticasone (FLONASE) 50 MCG/ACT nasal spray Place 2 sprays into both nostrils daily. (Patient not taking: Reported on 03/25/2022) 18 mL 0   ipratropium (ATROVENT) 0.06 % nasal spray Place 2 sprays into both nostrils 4 (four) times daily. As needed for nasal congestion, runny nose (Patient not taking: Reported on 03/25/2022) 15 mL 0   ketoconazole (NIZORAL) 2 % shampoo Apply 1 Application topically daily as needed for irritation.     Liraglutide -Weight Management (SAXENDA) 18 MG/3ML SOPN Inject 0.6 mg into the skin daily. 3 mL 1   medroxyPROGESTERone (PROVERA) 10 MG tablet Take 1  tablet (10 mg total) by mouth daily. Take first 10 days of every month 30 tablet 3   oxyCODONE (OXY IR/ROXICODONE) 5 MG immediate release tablet Take 1 tablet (5 mg total) by mouth every 6 (six) hours as needed for severe pain. 20 tablet 0   topiramate (TOPAMAX) 50 MG tablet Take 0.5 tablets (25 mg total) by mouth 2 (two) times daily. (Patient not taking: Reported on 03/25/2022) 30 tablet 0   Vitamin D, Ergocalciferol, (DRISDOL) 1.25 MG (50000 UNIT) CAPS capsule Take 1 capsule (50,000 Units total) by mouth every 7 (seven) days. (Patient not taking: Reported on 03/25/2022) 8 capsule 0   No facility-administered medications prior to visit.    PAST MEDICAL HISTORY: Past Medical History:  Diagnosis Date   Genital herpes    Headache    03/25/22- has not had in a while   Other fatigue    PCOS (polycystic ovarian syndrome)    Pre-diabetes    Prediabetes    Shortness of breath on exertion     PAST SURGICAL HISTORY: Past Surgical History:  Procedure Laterality Date   BREAST CYST EXCISION Bilateral 03/27/2022   Procedure: EXCISION OF BILATERAL BREAST CHRONIC ABSCESS;  Surgeon: Almond Lint, MD;  Location: [facility] OR;  Service: General;  Laterality: Bilateral;   TONSILLECTOMY      FAMILY HISTORY: Family History  Problem Relation Age of Onset   Hypertension Mother    Thyroid disease Mother    Sleep apnea Mother    Obesity Mother    Migraines Mother    Obesity Father    Sleep apnea Maternal Grandmother     SOCIAL HISTORY: Social History   Socioeconomic History   Marital status: Single    Spouse name: Not on file   Number of children: Not on file   Years of education: Not on file   Highest education level: Not on file  Occupational History   Occupation: Public relations account executive  Tobacco Use   Smoking status: Never   Smokeless tobacco: Never  Vaping Use   Vaping Use: Never used  Substance and Sexual Activity   Alcohol use: Not Currently    Comment: occ   Drug use: Yes    Types:  Marijuana    Comment: not every week   Sexual activity: Yes    Birth control/protection: Pill  Other Topics Concern   Not on file  Social History Narrative   Not on file   Social Determinants of Health   Financial Resource Strain: Not on file  Food Insecurity: Not on file  Transportation Needs: Not on file  Physical Activity: Not on file  Stress: Not on file  Social Connections: Not on file  Intimate Partner Violence: Not on file     PHYSICAL EXAM  There were no vitals filed for this visit. There is no height or weight on file to calculate BMI.  Generalized: Well developed, in  no acute distress  Cardiology: normal rate and rhythm, no murmur noted Respiratory: clear to auscultation bilaterally  Neurological examination  Mentation: Alert oriented to time, place, history taking. Follows all commands speech and language fluent Cranial nerve II-XII: Pupils were equal round reactive to light. Extraocular movements were full, visual field were full  Motor: The motor testing reveals 5 over 5 strength of all 4 extremities. Good symmetric motor tone is noted throughout.  Gait and station: Gait is normal.    DIAGNOSTIC DATA (LABS, IMAGING, TESTING) - I reviewed patient records, labs, notes, testing and imaging myself where available.      No data to display           Lab Results  Component Value Date   WBC 7.9 03/27/2022   HGB 12.7 03/27/2022   HCT 40.1 03/27/2022   MCV 85.3 03/27/2022   PLT 233 03/27/2022      Component Value Date/Time   NA 142 06/12/2021 0914   K 4.3 06/12/2021 0914   CL 109 (H) 06/12/2021 0914   CO2 22 06/12/2021 0914   GLUCOSE 87 06/12/2021 0914   BUN 8 06/12/2021 0914   CREATININE 0.97 06/12/2021 0914   CALCIUM 9.1 06/12/2021 0914   PROT 6.9 06/12/2021 0914   ALBUMIN 3.5 (L) 06/12/2021 0914   AST 36 06/12/2021 0914   ALT 15 06/12/2021 0914   ALKPHOS 53 06/12/2021 0914   BILITOT <0.2 06/12/2021 0914   GFRNONAA 107 03/06/2020 1012    GFRAA 124 03/06/2020 1012   Lab Results  Component Value Date   CHOL 150 03/06/2020   HDL 43 03/06/2020   LDLCALC 88 03/06/2020   TRIG 103 03/06/2020   Lab Results  Component Value Date   HGBA1C 5.8 (H) 06/12/2021   Lab Results  Component Value Date   VITAMINB12 262 03/06/2020   Lab Results  Component Value Date   TSH 1.790 10/23/2020     ASSESSMENT AND PLAN 22 y.o. year old female  has a past medical history of Genital herpes, Headache, Other fatigue, PCOS (polycystic ovarian syndrome), Pre-diabetes, Prediabetes, and Shortness of breath on exertion. here with   No diagnosis found.    Leinaala Channing is doing well on CPAP therapy. Compliance report reveals ***. *** was encouraged to continue using CPAP nightly and for greater than 4 hours each night. We will update supply orders as indicated. Risks of untreated sleep apnea review and education materials provided. Healthy lifestyle habits encouraged. *** will follow up in ***, sooner if needed. *** verbalizes understanding and agreement with this plan.    No orders of the defined types were placed in this encounter.    No orders of the defined types were placed in this encounter.     Shawnie Dapper, FNP-C 06/24/2022, 5:22 PM [facility] [address] [place], [place] [address] [phone]

## 2022-06-25 ENCOUNTER — Encounter: Payer: Self-pay | Admitting: Family Medicine

## 2022-06-25 ENCOUNTER — Ambulatory Visit: Payer: BC Managed Care – PPO | Admitting: Family Medicine

## 2022-07-06 IMAGING — US US BREAST*R* LIMITED INC AXILLA
1 series · 7 of 7 positions shown · non-contrast
Comparison: Previous exam(s).

CLINICAL DATA: History of recurrent breast cellulitis and skin
abscesses, involving both breasts, refractory to multiple rounds of
antibiotics.

Since most recent breast ultrasound of 01/25/2020, patient describes
recurrent palpable lump/infections within each breast, but now
improved again since starting most recent course of antibiotics.
EXAM:
ULTRASOUND OF THE BILATERAL BREAST

[Series 1: us breast*right* limited inc axilla · 0.07mm/px · 7 of 7 slices shown]
[im 1/7]
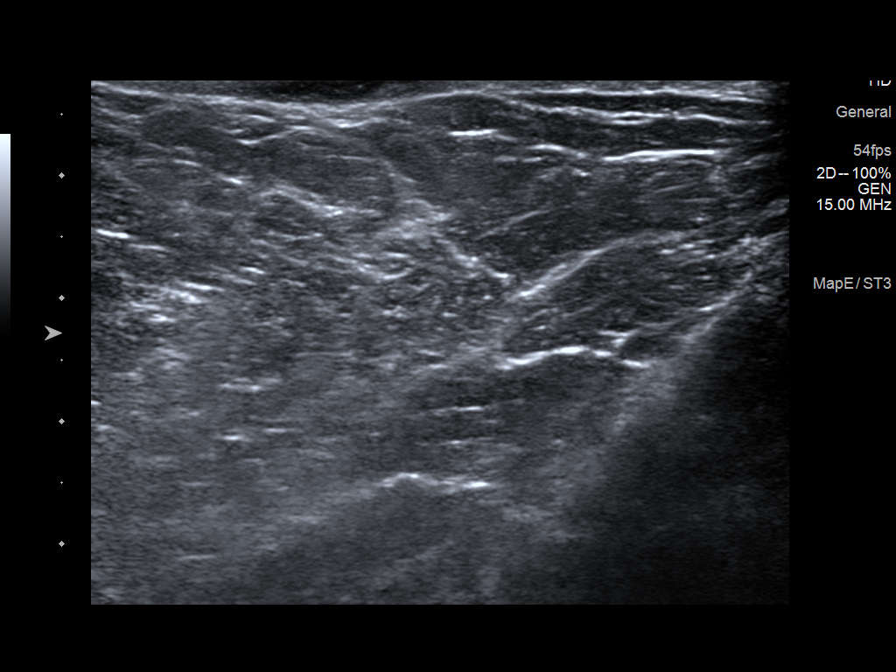
[im 2/7]
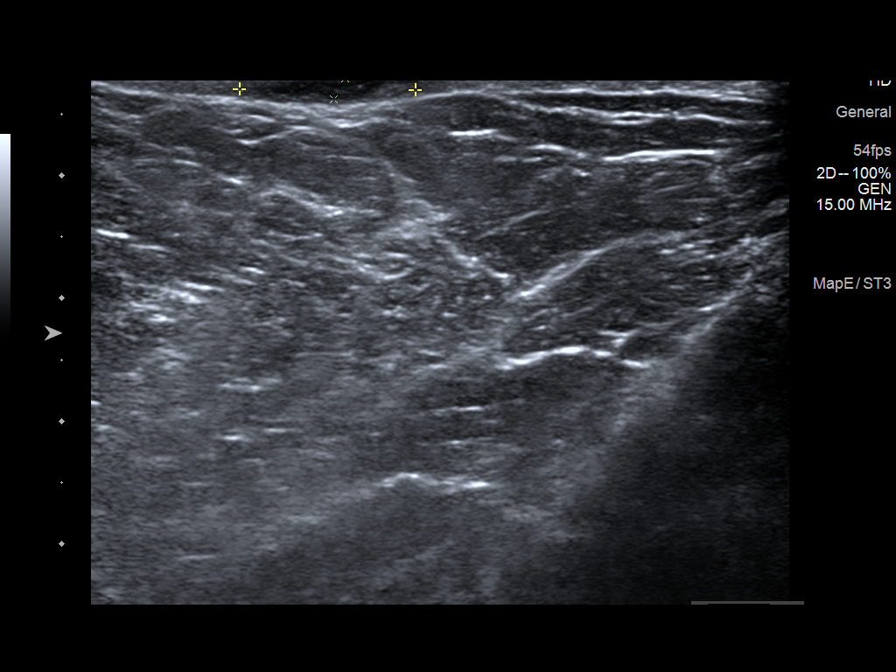
[im 3/7]
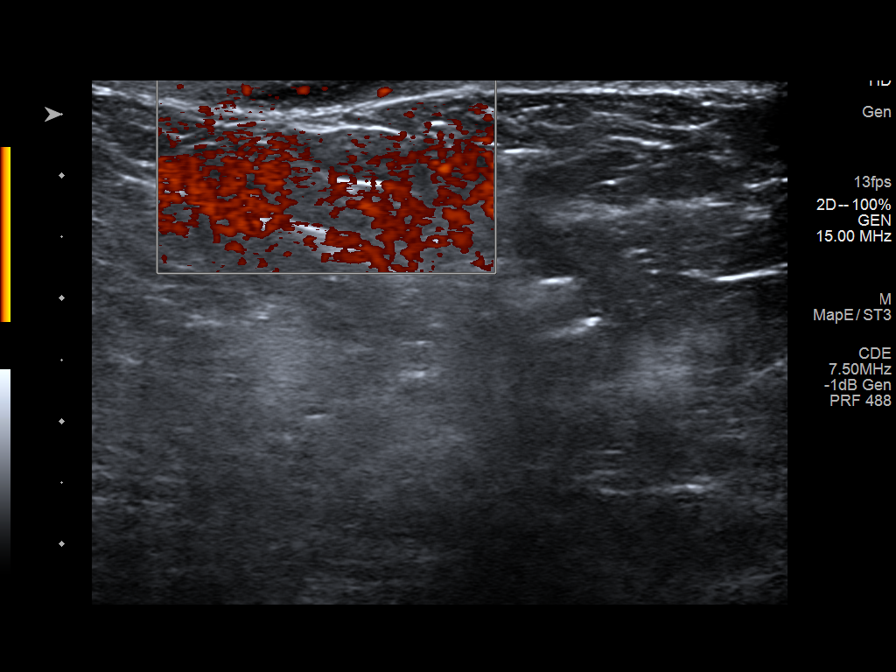
[im 4/7]
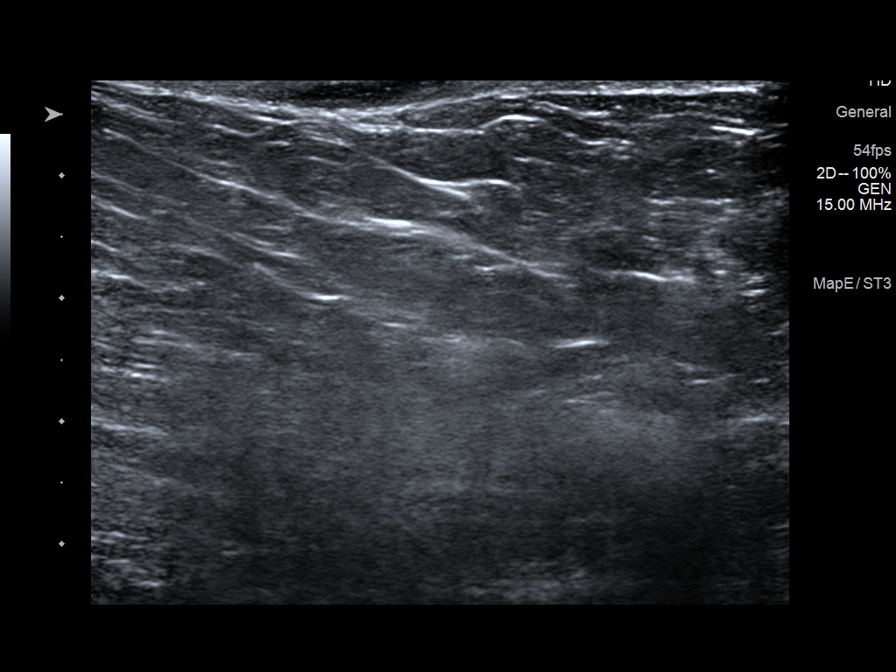
[im 5/7]
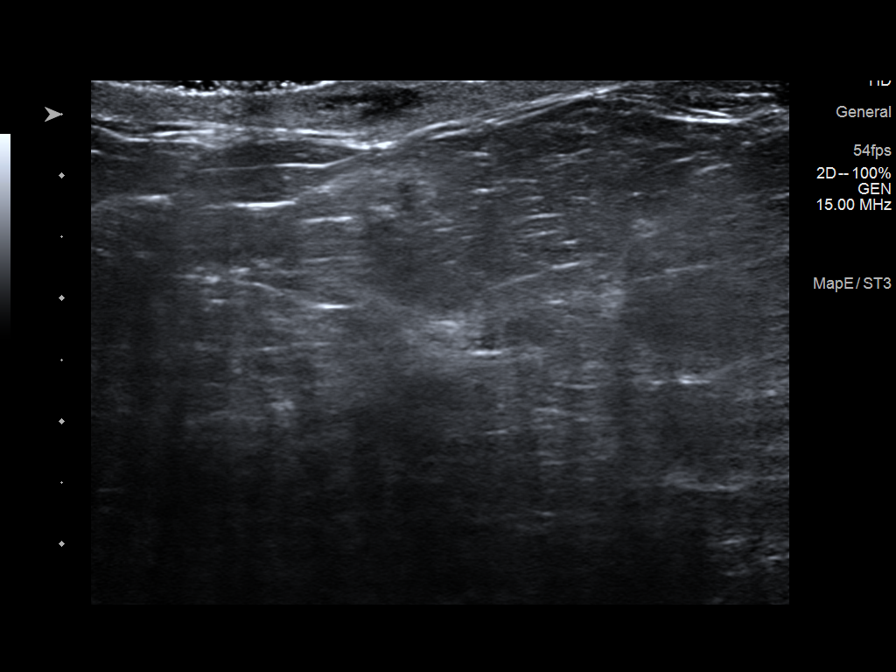
[im 6/7]
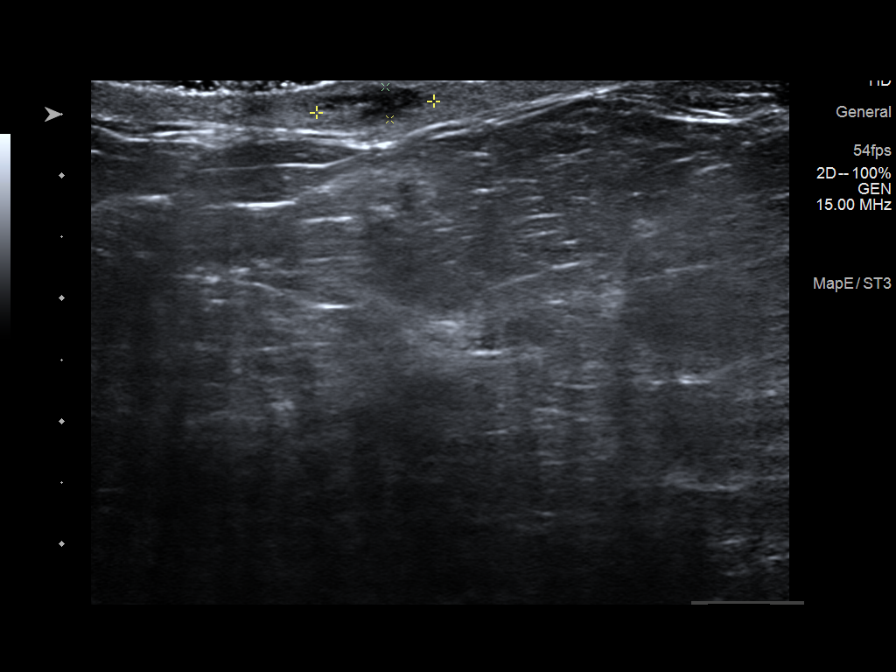
[im 7/7]
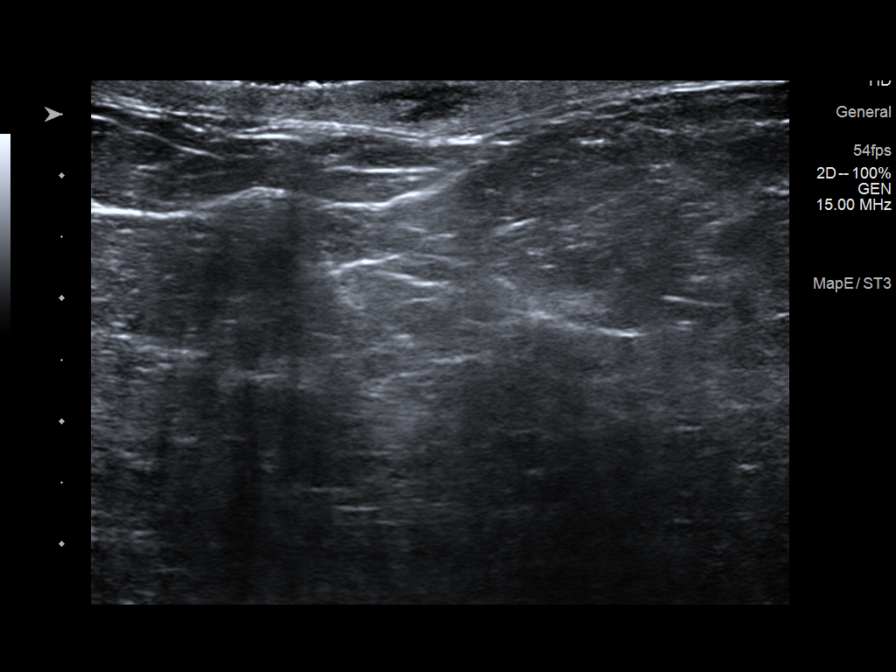

[7 of 7 positions shown; findings below may reference images not displayed]

FINDINGS: On physical exam, patient directs me to a palpable lump within the
lower inner quadrant of the RIGHT breast and a healing skin wound
within the lower inner quadrant of the LEFT breast. Ultrasound
evaluation is performed of both areas.

RIGHT breast: Targeted ultrasound is performed, showing a thin
hypoechoic collection within the RIGHT breast at the 5 o'clock axis,
4 cm from the nipple, measuring 14 mm in length and 2 mm in
thickness, compatible with a resolving sebaceous cyst/skin abscess.
No fluid collection or abscess within the underlying breast tissues.

LEFT breast: Targeted ultrasound is performed, showing a
similar-appearing hypoechoic collection within the LEFT breast at
the 8 o'clock axis, 4 cm from the nipple, measuring 10 mm in length
and 3 mm in thickness, also compatible with a resolving sebaceous
cyst/skin abscess. No fluid collection or abscess within the
underlying breast tissues.
IMPRESSION: 1. Resolving complicated sebaceous cyst/skin abscess within the
RIGHT breast at the 5 o'clock axis, 4 cm from the nipple, measuring
14 mm in length and 2 mm in thickness.
2. Resolving complicated sebaceous cyst/skin abscess within the LEFT
breast at the 8 o'clock axis, 4 cm from the nipple, measuring 10 mm
in length and 3 mm in thickness.
3. No abscess collection within the underlying breast tissues
bilaterally.

RECOMMENDATION:
1. Patient instructed to complete the current course of antibiotics.
2. No follow-up imaging is necessary for these benign findings.
3. If symptoms of cellulitis, palpable lump with pain, or
superficial drainage returns at either site, given the multiple
recurrences, would consider surgical referral for definitive
treatment.

I have discussed the findings and recommendations with the patient.
If applicable, a reminder letter will be sent to the patient
regarding the next appointment.

BI-RADS CATEGORY  2: Benign.
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
## 2022-07-28 ENCOUNTER — Ambulatory Visit
Admission: EM | Admit: 2022-07-28 | Discharge: 2022-07-28 | Disposition: A | Discharge status: Home / Self Care | Payer: BC Managed Care – PPO | Attending: Internal Medicine | Admitting: Internal Medicine

## 2022-07-28 ENCOUNTER — Encounter: Payer: Self-pay | Admitting: *Deleted

## 2022-07-28 DIAGNOSIS — Z113 Encounter for screening for infections with a predominantly sexual mode of transmission: Secondary | ICD-10-CM | POA: Insufficient documentation

## 2022-07-28 DIAGNOSIS — Z8619 Personal history of other infectious and parasitic diseases: Secondary | ICD-10-CM | POA: Insufficient documentation

## 2022-07-28 DIAGNOSIS — Z8742 Personal history of other diseases of the female genital tract: Secondary | ICD-10-CM | POA: Diagnosis not present

## 2022-07-28 DIAGNOSIS — N898 Other specified noninflammatory disorders of vagina: Secondary | ICD-10-CM | POA: Insufficient documentation

## 2022-07-28 DIAGNOSIS — Z3202 Encounter for pregnancy test, result negative: Secondary | ICD-10-CM | POA: Diagnosis not present

## 2022-07-28 LAB — POCT URINALYSIS DIP (MANUAL ENTRY)
Bilirubin, UA: NEGATIVE
Blood, UA: NEGATIVE
Glucose, UA: NEGATIVE mg/dL
Ketones, POC UA: NEGATIVE mg/dL
Leukocytes, UA: NEGATIVE
Nitrite, UA: NEGATIVE
Protein Ur, POC: NEGATIVE mg/dL
Spec Grav, UA: 1.02 (ref 1.010–1.025)
Urobilinogen, UA: 0.2 E.U./dL
pH, UA: 8.5 — AB (ref 5.0–8.0)

## 2022-07-28 LAB — POCT URINE PREGNANCY: Preg Test, Ur: NEGATIVE

## 2022-07-28 NOTE — ED Provider Notes (Signed)
[facility]    CSN: [id number] Arrival date & time: 07/28/22  1819      History   Chief Complaint Chief Complaint  Patient presents with   Vaginal Irritation    HPI Peggy Garza is a 22 y.o. female.   Patient presents to urgent care for evaluation of vaginal irritation that started 2-3 days ago. States it's "so hard to describe". States "it doesn't really itch, but it itches and I can feel it". Denies vaginal discharge but states she "feels like discharge is coming out". No vaginal odor. Denies urinary symptoms. No N/V/D, abdominal pain, rashes, fever/chills, or dizziness. No recent new sexual partners. She is sexually active with female partner without protection. No known exposure to STD or concern for STD. She has not tried any over the counter medicines. History of pre-diabetes and PCOS. Menses are irregular and she is unsure of her LMP.      Past Medical History:  Diagnosis Date   Genital herpes    Headache    03/25/22- has not had in a while   Other fatigue    PCOS (polycystic ovarian syndrome)    Pre-diabetes    Prediabetes    Shortness of breath on exertion     Patient Active Problem List   Diagnosis Date Noted   Pre-diabetes 10/31/2021   Bilateral pendulous breasts 08/27/2021   Chronic migraine without aura without status migrainosus, not intractable 10/23/2020   Hyperglycemia 09/02/2020   Class 3 severe obesity with serious comorbidity and body mass index (BMI) of 45.0 to 49.9 in adult (HCC) 09/02/2020   Vitamin D deficiency 06/22/2019   Prediabetes 06/22/2019   PCOS (polycystic ovarian syndrome) 06/15/2019   Genital herpes simplex 03/01/2019   Gallstone 03/01/2019   PTSD (post-traumatic stress disorder) 08/21/2017   MDD (major depressive disorder), recurrent episode, mild (HCC) 08/21/2017    Past Surgical History:  Procedure Laterality Date   BREAST CYST EXCISION Bilateral 03/27/2022   Procedure: EXCISION OF BILATERAL BREAST CHRONIC  ABSCESS;  Surgeon: Almond Lint, MD;  Location: [facility] OR;  Service: General;  Laterality: Bilateral;   TONSILLECTOMY      OB History     Gravida  0   Para  0   Term  0   Preterm  0   AB  0   Living  0      SAB  0   IAB  0   Ectopic  0   Multiple  0   Live Births  0            Home Medications    Prior to Admission medications   Medication Sig Start Date End Date Taking? Authorizing Provider  acyclovir (ZOVIRAX) 200 MG/5ML suspension Take 800 mg by mouth 3 (three) times daily as needed (Outbreak).    [provider]  albuterol (VENTOLIN HFA) 108 (90 Base) MCG/ACT inhaler Inhale 2 puffs into the lungs every 6 (six) hours as needed for wheezing or shortness of breath (Cough). 04/24/21   Theadora Rama Scales, PA-C  cetirizine (ZYRTEC) 10 MG tablet Take 10 mg by mouth daily as needed for allergies. 10/23/21   [provider]  fluticasone (FLONASE) 50 MCG/ACT nasal spray Place 2 sprays into both nostrils daily. Patient not taking: Reported on 03/25/2022 04/24/21   Theadora Rama Scales, PA-C  ipratropium (ATROVENT) 0.06 % nasal spray Place 2 sprays into both nostrils 4 (four) times daily. As needed for nasal congestion, runny nose Patient not taking: Reported on 03/25/2022 04/24/21  Theadora Rama Scales, PA-C  ketoconazole (NIZORAL) 2 % shampoo Apply 1 Application topically daily as needed for irritation.    [provider]  Liraglutide -Weight Management (SAXENDA) 18 MG/3ML SOPN Inject 0.6 mg into the skin daily. 05/21/22   Levie Heritage, DO  medroxyPROGESTERone (PROVERA) 10 MG tablet Take 1 tablet (10 mg total) by mouth daily. Take first 10 days of every month 12/19/21   Levie Heritage, DO  oxyCODONE (OXY IR/ROXICODONE) 5 MG immediate release tablet Take 1 tablet (5 mg total) by mouth every 6 (six) hours as needed for severe pain. 03/27/22   Almond Lint, MD  topiramate (TOPAMAX) 50 MG tablet Take 0.5 tablets (25 mg total) by mouth 2 (two)  times daily. Patient not taking: Reported on 03/25/2022 12/04/21   Thomasene Lot, DO  Vitamin D, Ergocalciferol, (DRISDOL) 1.25 MG (50000 UNIT) CAPS capsule Take 1 capsule (50,000 Units total) by mouth every 7 (seven) days. Patient not taking: Reported on 03/25/2022 10/31/21   Wilder Glade, MD    Family History Family History  Problem Relation Age of Onset   Hypertension Mother    Thyroid disease Mother    Sleep apnea Mother    Obesity Mother    Migraines Mother    Obesity Father    Sleep apnea Maternal Grandmother     Social History Social History   Tobacco Use   Smoking status: Never   Smokeless tobacco: Never  Vaping Use   Vaping Use: Never used  Substance Use Topics   Alcohol use: Not Currently    Comment: occasionally   Drug use: Yes    Types: Marijuana     Allergies   Victoza [liraglutide]   Review of Systems Review of Systems Per HPI  Physical Exam Triage Vital Signs ED Triage Vitals  Enc Vitals Group     BP 07/28/22 2001 111/75     Pulse Rate 07/28/22 2001 65     Resp 07/28/22 2001 16     Temp 07/28/22 2001 98.1 F (36.7 C)     Temp Source 07/28/22 2001 Oral     SpO2 07/28/22 2001 95 %     Weight --      Height --      Head Circumference --      Peak Flow --      Pain Score 07/28/22 2002 0     Pain Loc --      Pain Edu? --      Excl. in GC? --    No data found.  Updated Vital Signs BP 111/75   Pulse 65   Temp 98.1 F (36.7 C) (Oral)   Resp 16   SpO2 95%   Visual Acuity Right Eye Distance:   Left Eye Distance:   Bilateral Distance:    Right Eye Near:   Left Eye Near:    Bilateral Near:     Physical Exam Vitals and nursing note reviewed.  Constitutional:      Appearance: She is not ill-appearing or toxic-appearing.  HENT:     Head: Normocephalic and atraumatic.     Right Ear: Hearing and external ear normal.     Left Ear: Hearing and external ear normal.     Nose: Nose normal.     Mouth/Throat:     Lips: Pink.   Eyes:     General: Lids are normal. Vision grossly intact. Gaze aligned appropriately.     Extraocular Movements: Extraocular movements intact.  Conjunctiva/sclera: Conjunctivae normal.  Pulmonary:     Effort: Pulmonary effort is normal.  Musculoskeletal:     Cervical back: Neck supple.  Skin:    General: Skin is warm and dry.     Capillary Refill: Capillary refill takes less than 2 seconds.     Findings: No rash.  Neurological:     General: No focal deficit present.     Mental Status: She is alert and oriented to person, place, and time. Mental status is at baseline.     Cranial Nerves: No dysarthria or facial asymmetry.  Psychiatric:        Mood and Affect: Mood normal.        Speech: Speech normal.        Behavior: Behavior normal.        Thought Content: Thought content normal.        Judgment: Judgment normal.      UC Treatments / Results  Labs (all labs ordered are listed, but only abnormal results are displayed) Labs Reviewed  POCT URINALYSIS DIP (MANUAL ENTRY) - Abnormal; Notable for the following components:      Result Value   Clarity, UA cloudy (*)    pH, UA 8.5 (*)    All other components within normal limits  POCT URINE PREGNANCY  CERVICOVAGINAL ANCILLARY ONLY    EKG   Radiology No results found.  Procedures Procedures (including critical care time)  Medications Ordered in UC Medications - No data to display  Initial Impression / Assessment and Plan / UC Course  I have reviewed the triage vital signs and the nursing notes.  Pertinent labs & imaging results that were available during my care of the patient were reviewed by me and considered in my medical decision making (see chart for details).   1. Vaginal irritation, negative urine pregnancy test STI labs pending.  Patient declines HIV and syphilis testing today.  Will notify patient of positive results and treat accordingly when labs come back.  Patient to avoid sexual intercourse until  screening testing comes back.  Education provided regarding safe sexual practices and patient encouraged to use protection to prevent spread of STIs.   Urine pregnancy is negative.  Urinalysis is unremarkable for signs of urinary tract infection.  Patient encouraged to increase water intake to stay well hydrated and avoid frequent intake of urinary irritants.   Discussed physical exam and available lab work findings in clinic with patient.  Counseled patient regarding appropriate use of medications and potential side effects for all medications recommended or prescribed today. Discussed red flag signs and symptoms of worsening condition,when to call the PCP office, return to urgent care, and when to seek higher level of care in the emergency department. Patient verbalizes understanding and agreement with plan. All questions answered. Patient discharged in stable condition.   Final Clinical Impressions(s) / UC Diagnoses   Final diagnoses:  Vaginal irritation     Discharge Instructions      Your STD testing has been sent to the lab and will come back in the next 2 to 3 days.  We will call you if any of your results are positive requiring treatment and treat you at that time.   If you do not receive a phone call from [place], this means your testing was negative.  Avoid sexual intercourse until your STD results come back.  If any of your STD results are positive, you will need to avoid sexual intercourse for 7 days while you are  being treated to prevent spread of STD.  Condom use is the best way to prevent spread of STDs.  Return to urgent care as needed.      ED Prescriptions   None    PDMP not reviewed this encounter.   Carlisle Beers, [place] 07/28/22 2156

## 2022-07-28 NOTE — ED Triage Notes (Signed)
Pt describes left sided vaginal irritation onset approx 2-3 days ago. Denies vaginal discharge. States it's "hard to describe -- it just feels weird". States has had more sexual activity than usual.

## 2022-07-28 NOTE — Discharge Instructions (Signed)

## 2022-07-30 LAB — CERVICOVAGINAL ANCILLARY ONLY
Bacterial Vaginitis (gardnerella): NEGATIVE
Candida Glabrata: NEGATIVE
Candida Vaginitis: POSITIVE — AB
Chlamydia: NEGATIVE
Comment: NEGATIVE
Comment: NEGATIVE
Comment: NEGATIVE
Comment: NEGATIVE
Comment: NEGATIVE
Comment: NORMAL
Neisseria Gonorrhea: NEGATIVE
Trichomonas: NEGATIVE

## 2022-07-31 ENCOUNTER — Telehealth (HOSPITAL_COMMUNITY): Payer: Self-pay | Admitting: Emergency Medicine

## 2022-07-31 MED ORDER — FLUCONAZOLE 150 MG PO TABS: 150.0000 mg | ORAL_TABLET | Freq: Once | ORAL | 0 refills | Status: AC

## 2022-08-29 DIAGNOSIS — B3731 Acute candidiasis of vulva and vagina: Secondary | ICD-10-CM | POA: Diagnosis not present

## 2022-08-29 DIAGNOSIS — N898 Other specified noninflammatory disorders of vagina: Secondary | ICD-10-CM | POA: Diagnosis not present

## 2022-09-04 DIAGNOSIS — R102 Pelvic and perineal pain: Secondary | ICD-10-CM | POA: Diagnosis not present

## 2022-09-09 DIAGNOSIS — Z3189 Encounter for other procreative management: Secondary | ICD-10-CM | POA: Diagnosis not present

## 2022-09-09 DIAGNOSIS — N979 Female infertility, unspecified: Secondary | ICD-10-CM | POA: Diagnosis not present

## 2022-09-09 DIAGNOSIS — N970 Female infertility associated with anovulation: Secondary | ICD-10-CM | POA: Diagnosis not present

## 2022-09-09 DIAGNOSIS — Z3169 Encounter for other general counseling and advice on procreation: Secondary | ICD-10-CM | POA: Diagnosis not present

## 2022-09-11 DIAGNOSIS — L0292 Furuncle, unspecified: Secondary | ICD-10-CM | POA: Diagnosis not present

## 2022-09-11 DIAGNOSIS — S93601A Unspecified sprain of right foot, initial encounter: Secondary | ICD-10-CM | POA: Diagnosis not present

## 2022-09-15 DIAGNOSIS — N970 Female infertility associated with anovulation: Secondary | ICD-10-CM | POA: Diagnosis not present

## 2022-09-15 DIAGNOSIS — Z31430 Encounter of female for testing for genetic disease carrier status for procreative management: Secondary | ICD-10-CM | POA: Diagnosis not present

## 2022-09-15 DIAGNOSIS — Z3189 Encounter for other procreative management: Secondary | ICD-10-CM | POA: Diagnosis not present

## 2022-09-15 DIAGNOSIS — Z3169 Encounter for other general counseling and advice on procreation: Secondary | ICD-10-CM | POA: Diagnosis not present

## 2022-09-15 DIAGNOSIS — Z319 Encounter for procreative management, unspecified: Secondary | ICD-10-CM | POA: Diagnosis not present

## 2022-09-15 DIAGNOSIS — E559 Vitamin D deficiency, unspecified: Secondary | ICD-10-CM | POA: Diagnosis not present

## 2022-09-15 DIAGNOSIS — N979 Female infertility, unspecified: Secondary | ICD-10-CM | POA: Diagnosis not present

## 2022-10-15 NOTE — Progress Notes (Deleted)
No chief complaint on file.   HISTORY OF PRESENT ILLNESS:  10/15/22 ALL:  Peggy Garza is a 22 y.o. female here today for follow up for migraines. She was seen in consult with Dr Lucia Gaskins 10/2020 and started on topiramate 50mg  BID. She was seen by Dr Frances Furbish for EDS 08/2021. Sleep study was advised but not performed. Since,    HISTORY (copied from Dr Trevor Mace previous note)   HPI:  Peggy Garza is a 22 y.o. female here as requested by Vonna Kotyk, NP for headaches.   She wakes up with headaches and that is when they are the worst. She wakes up with them. She may try OTC from time to time but most times no medications. Ongoing for several years. She has gained weight. Mother and grandmother have sleep apnea. Mother has migraines and had a tumor on the brain that is being monitored. She has headache most of the day. They are all over the head. She also has another headache that is more migrainous, pulsating/pounding/throbbing, light and sound sensitivity, mild nausea, a dark room room makes it better, unknown triggers. Daily headaches. Worse in the morning and when laying down. She has migraines 8-12 days a month. No vision changes, no aura, no medication overuse but when she gets the headaches the vision can change to blurriness, does not wake her up but wakes up with headaches. Never feels refreshed. Takes naps throughout the day.No other focal neurologic deficits, associated symptoms, inciting events or modifiable factors.   Reviewed notes, labs and imaging from outside physicians, which showed:   Meds tried: Amitriptyline/nortriptyline, propranol is contraindicated due to low blood presure.    CMP unremarkable 08/23/2020 Hgba1c 5.9   REVIEW OF SYSTEMS: Out of a complete 14 system review of symptoms, the patient complains only of the following symptoms, and all other reviewed systems are negative.   ALLERGIES: Allergies  Allergen Reactions   Victoza [Liraglutide] Other (See  Comments)    Patient notes side effects of sore throat and hoarseness, however nurse documentation reports that the patient stated that her throat was closing.  Patient never sought emergency care and never had problems breathing.     HOME MEDICATIONS: Outpatient Medications Prior to Visit  Medication Sig Dispense Refill   acyclovir (ZOVIRAX) 200 MG/5ML suspension Take 800 mg by mouth 3 (three) times daily as needed (Outbreak).     albuterol (VENTOLIN HFA) 108 (90 Base) MCG/ACT inhaler Inhale 2 puffs into the lungs every 6 (six) hours as needed for wheezing or shortness of breath (Cough). 18 g 0   cetirizine (ZYRTEC) 10 MG tablet Take 10 mg by mouth daily as needed for allergies.     fluticasone (FLONASE) 50 MCG/ACT nasal spray Place 2 sprays into both nostrils daily. (Patient not taking: Reported on 03/25/2022) 18 mL 0   ipratropium (ATROVENT) 0.06 % nasal spray Place 2 sprays into both nostrils 4 (four) times daily. As needed for nasal congestion, runny nose (Patient not taking: Reported on 03/25/2022) 15 mL 0   ketoconazole (NIZORAL) 2 % shampoo Apply 1 Application topically daily as needed for irritation.     Liraglutide -Weight Management (SAXENDA) 18 MG/3ML SOPN Inject 0.6 mg into the skin daily. 3 mL 1   medroxyPROGESTERone (PROVERA) 10 MG tablet Take 1 tablet (10 mg total) by mouth daily. Take first 10 days of every month 30 tablet 3   oxyCODONE (OXY IR/ROXICODONE) 5 MG immediate release tablet Take 1 tablet (5 mg total) by mouth every  6 (six) hours as needed for severe pain. 20 tablet 0   topiramate (TOPAMAX) 50 MG tablet Take 0.5 tablets (25 mg total) by mouth 2 (two) times daily. (Patient not taking: Reported on 03/25/2022) 30 tablet 0   Vitamin D, Ergocalciferol, (DRISDOL) 1.25 MG (50000 UNIT) CAPS capsule Take 1 capsule (50,000 Units total) by mouth every 7 (seven) days. (Patient not taking: Reported on 03/25/2022) 8 capsule 0   No facility-administered medications prior to visit.      PAST MEDICAL HISTORY: Past Medical History:  Diagnosis Date   Genital herpes    Headache    03/25/22- has not had in a while   Other fatigue    PCOS (polycystic ovarian syndrome)    Pre-diabetes    Prediabetes    Shortness of breath on exertion      PAST SURGICAL HISTORY: Past Surgical History:  Procedure Laterality Date   BREAST CYST EXCISION Bilateral 03/27/2022   Procedure: EXCISION OF BILATERAL BREAST CHRONIC ABSCESS;  Surgeon: Almond Lint, MD;  Location: [facility] OR;  Service: General;  Laterality: Bilateral;   TONSILLECTOMY       FAMILY HISTORY: Family History  Problem Relation Age of Onset   Hypertension Mother    Thyroid disease Mother    Sleep apnea Mother    Obesity Mother    Migraines Mother    Obesity Father    Sleep apnea Maternal Grandmother      SOCIAL HISTORY: Social History   Socioeconomic History   Marital status: Single    Spouse name: Not on file   Number of children: Not on file   Years of education: Not on file   Highest education level: Not on file  Occupational History   Occupation: Public relations account executive  Tobacco Use   Smoking status: Never   Smokeless tobacco: Never  Vaping Use   Vaping status: Never Used  Substance and Sexual Activity   Alcohol use: Not Currently    Comment: occasionally   Drug use: Yes    Types: Marijuana   Sexual activity: Yes    Birth control/protection: None  Other Topics Concern   Not on file  Social History Narrative   Not on file   Social Determinants of Health   Financial Resource Strain: Not on file  Food Insecurity: Not on file  Transportation Needs: Not on file  Physical Activity: Not on file  Stress: Not on file  Social Connections: Unknown (06/25/2021)   Received from [facility]   Social Network    Social Network: Not on file  Intimate Partner Violence: Unknown (05/16/2021)   Received from [facility]   HITS    Physically Hurt: Not on file    Insult or Talk Down To: Not on file     Threaten Physical Harm: Not on file    Scream or Curse: Not on file     PHYSICAL EXAM  There were no vitals filed for this visit. There is no height or weight on file to calculate BMI.  Generalized: Well developed, in no acute distress  Cardiology: normal rate and rhythm, no murmur auscultated  Respiratory: clear to auscultation bilaterally    Neurological examination  Mentation: Alert oriented to time, place, history taking. Follows all commands speech and language fluent Cranial nerve II-XII: Pupils were equal round reactive to light. Extraocular movements were full, visual field were full on confrontational test. Facial sensation and strength were normal. Uvula tongue midline. Head turning and shoulder shrug  were normal and symmetric.  Motor: The motor testing reveals 5 over 5 strength of all 4 extremities. Good symmetric motor tone is noted throughout.  Sensory: Sensory testing is intact to soft touch on all 4 extremities. No evidence of extinction is noted.  Coordination: Cerebellar testing reveals good finger-nose-finger and heel-to-shin bilaterally.  Gait and station: Gait is normal. Tandem gait is normal. Romberg is negative. No drift is seen.  Reflexes: Deep tendon reflexes are symmetric and normal bilaterally.    DIAGNOSTIC DATA (LABS, IMAGING, TESTING) - I reviewed patient records, labs, notes, testing and imaging myself where available.  Lab Results  Component Value Date   WBC 7.9 03/27/2022   HGB 12.7 03/27/2022   HCT 40.1 03/27/2022   MCV 85.3 03/27/2022   PLT 233 03/27/2022      Component Value Date/Time   NA 142 06/12/2021 0914   K 4.3 06/12/2021 0914   CL 109 (H) 06/12/2021 0914   CO2 22 06/12/2021 0914   GLUCOSE 87 06/12/2021 0914   BUN 8 06/12/2021 0914   CREATININE 0.97 06/12/2021 0914   CALCIUM 9.1 06/12/2021 0914   PROT 6.9 06/12/2021 0914   ALBUMIN 3.5 (L) 06/12/2021 0914   AST 36 06/12/2021 0914   ALT 15 06/12/2021 0914   ALKPHOS 53  06/12/2021 0914   BILITOT <0.2 06/12/2021 0914   GFRNONAA 107 03/06/2020 1012   GFRAA 124 03/06/2020 1012   Lab Results  Component Value Date   CHOL 150 03/06/2020   HDL 43 03/06/2020   LDLCALC 88 03/06/2020   TRIG 103 03/06/2020   Lab Results  Component Value Date   HGBA1C 5.8 (H) 06/12/2021   Lab Results  Component Value Date   VITAMINB12 262 03/06/2020   Lab Results  Component Value Date   TSH 1.790 10/23/2020        No data to display               No data to display           ASSESSMENT AND PLAN  22 y.o. year old female  has a past medical history of Genital herpes, Headache, Other fatigue, PCOS (polycystic ovarian syndrome), Pre-diabetes, Prediabetes, and Shortness of breath on exertion. here with    No diagnosis found.  Peggy Garza ***.  Healthy lifestyle habits encouraged. *** will follow up with PCP as directed. *** will return to see me in ***, sooner if needed. *** verbalizes understanding and agreement with this plan.   No orders of the defined types were placed in this encounter.    No orders of the defined types were placed in this encounter.    Shawnie Dapper, MSN, FNP-C 10/15/2022, 12:54 PM  [facility] [address] [place], [place] [address] [phone]

## 2022-10-16 ENCOUNTER — Encounter: Payer: Self-pay | Admitting: Neurology

## 2022-10-16 ENCOUNTER — Ambulatory Visit (INDEPENDENT_AMBULATORY_CARE_PROVIDER_SITE_OTHER): Payer: BC Managed Care – PPO | Admitting: Neurology

## 2022-10-16 ENCOUNTER — Ambulatory Visit: Payer: BC Managed Care – PPO | Admitting: Family Medicine

## 2022-10-16 VITALS — BP 115/76 | HR 86 | Ht 62.0 in | Wt 262.5 lb

## 2022-10-16 DIAGNOSIS — G43709 Chronic migraine without aura, not intractable, without status migrainosus: Secondary | ICD-10-CM | POA: Diagnosis not present

## 2022-10-16 DIAGNOSIS — Z6841 Body Mass Index (BMI) 40.0 and over, adult: Secondary | ICD-10-CM

## 2022-10-16 DIAGNOSIS — R519 Headache, unspecified: Secondary | ICD-10-CM

## 2022-10-16 MED ORDER — RIZATRIPTAN BENZOATE 10 MG PO TABS: 10.0000 mg | ORAL_TABLET | ORAL | 2 refills | Status: DC | PRN

## 2022-10-16 NOTE — Patient Instructions (Addendum)
I will order home sleep study   Use Maxalt only for severe headaches, I would not take Maxalt during pregnancy    May consider the below preventive Supplements for migraines: -Magnesium oxide 400 mg daily -Riboflavin 200 mg twice a day

## 2022-10-16 NOTE — Progress Notes (Signed)
Patient: Peggy Garza Date of Birth: 2000-06-28  Reason for Visit: Follow up History from: Patient Primary Neurologist: Lucia Gaskins   ASSESSMENT AND PLAN 22 y.o. year old female   1.  Chronic migraine headaches 2.  Morning headache 3.  Daytime fatigue 4.  Obesity, BMI 48  -Currently trying to get pregnant, would not recommend starting Topamax due to teratogenicity.  We discussed magnesium, riboflavin as migraine preventative (-Magnesium oxide 400 mg daily, Riboflavin 200 mg twice a day).  I did refill Maxalt for rescue, stop if she becomes pregnant. Discuss management with fertility clinic. Use Tylenol for acute headache treatment.  -Order home sleep study (never had completed 2023) to screen for sleep apnea, has gained 30 pounds since last seen, BMI 48.  Suspect majority of her headaches are due to untreated OSA -Follow-up in 6 months or sooner if needed  HISTORY OF PRESENT ILLNESS: Today 10/16/22 Saw Dr. Lucia Gaskins September 2022 for headaches.  Referred for sleep consult. Given Maxalt and Topamax.  MRI of the brain with and without contrast was normal October 2022.  Sleep consult January 2023 with Dr. Frances Furbish. She never did the sleep study, didn't get around to getting it. Waking up with headache, snoring is less, suboptimal energy level, non-restorative sleep. 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 Waking up in the morning with headache, right frontal, behind right eye. Will take Tylenol or Motrin with good benefit. Also had migraines, once a week, generalized, pain is severe, sensitive to light or sound, no vomiting. Takes BC powder, it helps. Has gained 30 lbs since last seen, BMI 48. Would like to restart Topamax, Maxalt. Is trying to get pregnant currently, no children currently. ESS 8.  HISTORY  10/23/20 Dr. Lucia Gaskins: HPI:  Peggy Garza is a 22 y.o. female here as requested by Vonna Kotyk, NP for headaches.   She wakes up with headaches and that is when they are  the worst. She wakes up with them. She may try OTC from time to time but most times no medications. Ongoing for several years. She has gained weight. Mother and grandmother have sleep apnea. Mother has migraines and had a tumor on the brain that is being monitored. She has headache most of the day. They are all over the head. She also has another headache that is more migrainous, pulsating/pounding/throbbing, light and sound sensitivity, mild nausea, a dark room room makes it better, unknown triggers. Daily headaches. Worse in the morning and when laying down. She has migraines 8-12 days a month. No vision changes, no aura, no medication overuse but when she gets the headaches the vision can change to blurriness, does not wake her up but wakes up with headaches. Never feels refreshed. Takes naps throughout the day.No other focal neurologic deficits, associated symptoms, inciting events or modifiable factors.   Reviewed notes, labs and imaging from outside physicians, which showed:   Meds tried: Amitriptyline/nortriptyline, propranol is contraindicated due to low blood presure.    CMP unremarkable 08/23/2020 Hgba1c 5.9  REVIEW OF SYSTEMS: Out of a complete 14 system review of symptoms, the patient complains only of the following symptoms, and all other reviewed systems are negative.  See HPI  ALLERGIES: Allergies  Allergen Reactions   Victoza [Liraglutide] Other (See Comments)    Patient notes side effects of sore throat and hoarseness, however nurse documentation reports that the patient stated that her throat was closing.  Patient never sought emergency care and never had problems breathing.  HOME MEDICATIONS: Outpatient Medications Prior to Visit  Medication Sig Dispense Refill   acyclovir (ZOVIRAX) 200 MG/5ML suspension Take 800 mg by mouth 3 (three) times daily as needed (Outbreak).     albuterol (VENTOLIN HFA) 108 (90 Base) MCG/ACT inhaler Inhale 2 puffs into the lungs every 6 (six)  hours as needed for wheezing or shortness of breath (Cough). 18 g 0   cetirizine (ZYRTEC) 10 MG tablet Take 10 mg by mouth daily as needed for allergies.     ketoconazole (NIZORAL) 2 % shampoo Apply 1 Application topically daily as needed for irritation.     medroxyPROGESTERone (PROVERA) 10 MG tablet Take 1 tablet (10 mg total) by mouth daily. Take first 10 days of every month 30 tablet 3   fluticasone (FLONASE) 50 MCG/ACT nasal spray Place 2 sprays into both nostrils daily. (Patient not taking: Reported on 03/25/2022) 18 mL 0   ipratropium (ATROVENT) 0.06 % nasal spray Place 2 sprays into both nostrils 4 (four) times daily. As needed for nasal congestion, runny nose (Patient not taking: Reported on 03/25/2022) 15 mL 0   Liraglutide -Weight Management (SAXENDA) 18 MG/3ML SOPN Inject 0.6 mg into the skin daily. 3 mL 1   oxyCODONE (OXY IR/ROXICODONE) 5 MG immediate release tablet Take 1 tablet (5 mg total) by mouth every 6 (six) hours as needed for severe pain. 20 tablet 0   topiramate (TOPAMAX) 50 MG tablet Take 0.5 tablets (25 mg total) by mouth 2 (two) times daily. (Patient not taking: Reported on 03/25/2022) 30 tablet 0   Vitamin D, Ergocalciferol, (DRISDOL) 1.25 MG (50000 UNIT) CAPS capsule Take 1 capsule (50,000 Units total) by mouth every 7 (seven) days. (Patient not taking: Reported on 03/25/2022) 8 capsule 0   No facility-administered medications prior to visit.    PAST MEDICAL HISTORY: Past Medical History:  Diagnosis Date   Genital herpes    Headache    03/25/22- has not had in a while   Other fatigue    PCOS (polycystic ovarian syndrome)    Pre-diabetes    Prediabetes    Shortness of breath on exertion     PAST SURGICAL HISTORY: Past Surgical History:  Procedure Laterality Date   BREAST CYST EXCISION Bilateral 03/27/2022   Procedure: EXCISION OF BILATERAL BREAST CHRONIC ABSCESS;  Surgeon: Almond Lint, MD;  Location: [facility] OR;  Service: General;  Laterality: Bilateral;    TONSILLECTOMY      FAMILY HISTORY: Family History  Problem Relation Age of Onset   Hypertension Mother    Thyroid disease Mother    Sleep apnea Mother    Obesity Mother    Migraines Mother    Obesity Father    Sleep apnea Maternal Grandmother     SOCIAL HISTORY: Social History   Socioeconomic History   Marital status: Single    Spouse name: Not on file   Number of children: Not on file   Years of education: Not on file   Highest education level: Not on file  Occupational History   Occupation: Public relations account executive  Tobacco Use   Smoking status: Never   Smokeless tobacco: Never  Vaping Use   Vaping status: Never Used  Substance and Sexual Activity   Alcohol use: Not Currently    Comment: occasionally   Drug use: Yes    Types: Marijuana   Sexual activity: Yes    Birth control/protection: None  Other Topics Concern   Not on file  Social History Narrative   Not on file  Social Determinants of Health   Financial Resource Strain: Not on file  Food Insecurity: Not on file  Transportation Needs: Not on file  Physical Activity: Not on file  Stress: Not on file  Social Connections: Unknown (06/25/2021)   [facility] from [facility]   Social Network    Social Network: Not on file  Intimate Partner Violence: Unknown (05/16/2021)   Received from [facility]   HITS    Physically Hurt: Not on file    Insult or Talk Down To: Not on file    Threaten Physical Harm: Not on file    Scream or Curse: Not on file    PHYSICAL EXAM  Vitals:   10/16/22 1242  BP: 115/76  Pulse: 86  Weight: 262 lb 8 oz (119.1 kg)  Height: 5\' 2"  (1.575 m)   Body mass index is 48.01 kg/m.  Generalized: Well developed, in no acute distress  Neurological examination  Mentation: Alert oriented to time, place, history taking. Follows all commands speech and language fluent Cranial nerve II-XII: Pupils were equal round reactive to light. Extraocular movements were full, visual field were  full on confrontational test. Facial sensation and strength were normal.  Head turning and shoulder shrug  were normal and symmetric. Motor: The motor testing reveals 5 over 5 strength of all 4 extremities. Good symmetric motor tone is noted throughout.  Sensory: Sensory testing is intact to soft touch on all 4 extremities. No evidence of extinction is noted.  Coordination: Cerebellar testing reveals good finger-nose-finger and heel-to-shin bilaterally.  Gait and station: Gait is normal.  Reflexes: Deep tendon reflexes are symmetric and normal bilaterally.   DIAGNOSTIC DATA (LABS, IMAGING, TESTING) - I reviewed patient records, labs, notes, testing and imaging myself where available.  Lab Results  Component Value Date   WBC 7.9 03/27/2022   HGB 12.7 03/27/2022   HCT 40.1 03/27/2022   MCV 85.3 03/27/2022   PLT 233 03/27/2022      Component Value Date/Time   NA 142 06/12/2021 0914   K 4.3 06/12/2021 0914   CL 109 (H) 06/12/2021 0914   CO2 22 06/12/2021 0914   GLUCOSE 87 06/12/2021 0914   BUN 8 06/12/2021 0914   CREATININE 0.97 06/12/2021 0914   CALCIUM 9.1 06/12/2021 0914   PROT 6.9 06/12/2021 0914   ALBUMIN 3.5 (L) 06/12/2021 0914   AST 36 06/12/2021 0914   ALT 15 06/12/2021 0914   ALKPHOS 53 06/12/2021 0914   BILITOT <0.2 06/12/2021 0914   GFRNONAA 107 03/06/2020 1012   GFRAA 124 03/06/2020 1012   Lab Results  Component Value Date   CHOL 150 03/06/2020   HDL 43 03/06/2020   LDLCALC 88 03/06/2020   TRIG 103 03/06/2020   Lab Results  Component Value Date   HGBA1C 5.8 (H) 06/12/2021   Lab Results  Component Value Date   VITAMINB12 262 03/06/2020   Lab Results  Component Value Date   TSH 1.790 10/23/2020    Margie Ege, AGNP-C, DNP 10/16/2022, 12:56 PM [facility] [address], [address] [place], [place] [address] [phone]

## 2022-10-21 ENCOUNTER — Telehealth: Payer: Self-pay | Admitting: Neurology

## 2022-10-21 NOTE — Telephone Encounter (Signed)
[employer]ST [employer] [place]: [id number] (exp. 10/21/22 to 12/19/22) & [facility] Healthy blue pending

## 2022-10-24 ENCOUNTER — Other Ambulatory Visit: Payer: Self-pay

## 2022-10-24 ENCOUNTER — Encounter (HOSPITAL_BASED_OUTPATIENT_CLINIC_OR_DEPARTMENT_OTHER): Payer: Self-pay | Admitting: Emergency Medicine

## 2022-10-24 ENCOUNTER — Emergency Department (HOSPITAL_BASED_OUTPATIENT_CLINIC_OR_DEPARTMENT_OTHER)
Admission: EM | Admit: 2022-10-24 | Discharge: 2022-10-24 | Disposition: A | Discharge status: Home / Self Care | Payer: BC Managed Care – PPO | Attending: Emergency Medicine | Admitting: Emergency Medicine

## 2022-10-24 DIAGNOSIS — Z1152 Encounter for screening for COVID-19: Secondary | ICD-10-CM | POA: Insufficient documentation

## 2022-10-24 DIAGNOSIS — J069 Acute upper respiratory infection, unspecified: Secondary | ICD-10-CM | POA: Insufficient documentation

## 2022-10-24 DIAGNOSIS — B9789 Other viral agents as the cause of diseases classified elsewhere: Secondary | ICD-10-CM | POA: Diagnosis not present

## 2022-10-24 DIAGNOSIS — J029 Acute pharyngitis, unspecified: Secondary | ICD-10-CM

## 2022-10-24 DIAGNOSIS — R0981 Nasal congestion: Secondary | ICD-10-CM | POA: Diagnosis not present

## 2022-10-24 LAB — GROUP A STREP BY PCR: Group A Strep by PCR: NOT DETECTED

## 2022-10-24 LAB — SARS CORONAVIRUS 2 BY RT PCR: SARS Coronavirus 2 by RT PCR: NEGATIVE

## 2022-10-24 MED ORDER — ACETAMINOPHEN 500 MG PO TABS
1000.0000 mg | ORAL_TABLET | Freq: Once | ORAL | Status: AC
  Administered 2022-10-24: 1000 mg via ORAL
  Filled 2022-10-24: qty 2

## 2022-10-24 NOTE — ED Provider Notes (Signed)
[place] EMERGENCY DEPARTMENT AT [facility] Provider Note   CSN: [id number] Arrival date & time: 10/24/22  1843     History  Chief Complaint  Patient presents with   Medication Reaction    Peggy Garza is a 22 y.o. female.  Pt c/o sore throat in the past couple days, bilateral/diffuse. Also w congestion, runny nose, and occasional non prod cough. No known fevers. No specific known ill contacts or strep/mono/covid exposure. No chest pain or discomfort. No sob. No abd pain or nvd. No dysuria or gu c/o. No extremity pain or swelling. No severe headaches. No neck pain or stiffness. No sinus pain or ear pain. No trouble breathing or swallowing, but sore to swallow.   The history is provided by the patient and medical records.       Home Medications Prior to Admission medications   Medication Sig Start Date End Date Taking? Authorizing Provider  acyclovir (ZOVIRAX) 200 MG/5ML suspension Take 800 mg by mouth 3 (three) times daily as needed (Outbreak).    [provider]  albuterol (VENTOLIN HFA) 108 (90 Base) MCG/ACT inhaler Inhale 2 puffs into the lungs every 6 (six) hours as needed for wheezing or shortness of breath (Cough). 04/24/21   Theadora Rama Scales, PA-C  cetirizine (ZYRTEC) 10 MG tablet Take 10 mg by mouth daily as needed for allergies. 10/23/21   [provider]  fluticasone (FLONASE) 50 MCG/ACT nasal spray Place 2 sprays into both nostrils daily. Patient not taking: Reported on 03/25/2022 04/24/21   Theadora Rama Scales, PA-C  ipratropium (ATROVENT) 0.06 % nasal spray Place 2 sprays into both nostrils 4 (four) times daily. As needed for nasal congestion, runny nose Patient not taking: Reported on 03/25/2022 04/24/21   Theadora Rama Scales, PA-C  ketoconazole (NIZORAL) 2 % shampoo Apply 1 Application topically daily as needed for irritation.    [provider]  Liraglutide -Weight Management (SAXENDA) 18 MG/3ML SOPN Inject 0.6 mg into  the skin daily. 05/21/22   Levie Heritage, DO  medroxyPROGESTERone (PROVERA) 10 MG tablet Take 1 tablet (10 mg total) by mouth daily. Take first 10 days of every month 12/19/21   Levie Heritage, DO  oxyCODONE (OXY IR/ROXICODONE) 5 MG immediate release tablet Take 1 tablet (5 mg total) by mouth every 6 (six) hours as needed for severe pain. 03/27/22   Almond Lint, MD  rizatriptan (MAXALT) 10 MG tablet Take 1 tablet (10 mg total) by mouth as needed for migraine. May repeat in 2 hours if needed 10/16/22   Glean Salvo, NP  topiramate (TOPAMAX) 50 MG tablet Take 0.5 tablets (25 mg total) by mouth 2 (two) times daily. Patient not taking: Reported on 03/25/2022 12/04/21   Thomasene Lot, DO  Vitamin D, Ergocalciferol, (DRISDOL) 1.25 MG (50000 UNIT) CAPS capsule Take 1 capsule (50,000 Units total) by mouth every 7 (seven) days. Patient not taking: Reported on 03/25/2022 10/31/21   Wilder Glade, MD      Allergies    Victoza [liraglutide]    Review of Systems   Review of Systems  Constitutional:  Negative for fever.  HENT:  Positive for congestion, rhinorrhea and sore throat. Negative for trouble swallowing.   Eyes:  Negative for pain, redness and visual disturbance.  Respiratory:  Positive for cough. Negative for shortness of breath.   Cardiovascular:  Negative for chest pain and leg swelling.  Gastrointestinal:  Negative for abdominal pain, diarrhea and vomiting.  Genitourinary:  Negative for dysuria and flank pain.  Musculoskeletal:  Negative for back pain, neck pain and neck stiffness.  Skin:  Negative for rash.  Neurological:  Negative for headaches.  Hematological:  Does not bruise/bleed easily.  Psychiatric/Behavioral:  Negative for confusion.     Physical Exam Updated Vital Signs BP 131/83 (BP Location: Right Arm)   Pulse (!) 104   Temp 98.8 F (37.1 C) (Oral)   Resp 18   SpO2 100%  Physical Exam Vitals and nursing note reviewed.  Constitutional:      Appearance: Normal  appearance. She is well-developed.  HENT:     Head: Atraumatic.     Comments: No sinus or temporal tenderness.     Right Ear: Tympanic membrane normal.     Left Ear: Tympanic membrane normal.     Nose: Nose normal.     Mouth/Throat:     Mouth: Mucous membranes are moist.  Eyes:     General: No scleral icterus.    Conjunctiva/sclera: Conjunctivae normal.     Pupils: Pupils are equal, round, and reactive to light.  Neck:     Trachea: No tracheal deviation.     Comments: No stiffness or rigidity.  Cardiovascular:     Rate and Rhythm: Normal rate and regular rhythm.     Pulses: Normal pulses.     Heart sounds: Normal heart sounds. No murmur heard.    No friction rub. No gallop.  Pulmonary:     Effort: Pulmonary effort is normal. No respiratory distress.     Breath sounds: Normal breath sounds.  Abdominal:     General: There is no distension.     Palpations: Abdomen is soft.     Tenderness: There is no abdominal tenderness.  Genitourinary:    Comments: No cva tenderness.  Musculoskeletal:        General: No swelling or tenderness.     Cervical back: Normal range of motion and neck supple. No rigidity. No muscular tenderness.     Right lower leg: No edema.     Left lower leg: No edema.  Lymphadenopathy:     Cervical: No cervical adenopathy.  Skin:    General: Skin is warm and dry.     Findings: No rash.  Neurological:     Mental Status: She is alert.     Comments: Alert, speech normal.   Psychiatric:        Mood and Affect: Mood normal.     ED Results / Procedures / Treatments   Labs (all labs ordered are listed, but only abnormal results are displayed) Results for orders placed or performed during the hospital encounter of 10/24/22  SARS Coronavirus 2 by RT PCR (hospital order, performed in [facility] hospital lab) *cepheid single result test* Anterior Nasal Swab   Specimen: Anterior Nasal Swab  Result Value Ref Range   SARS Coronavirus 2 by RT PCR NEGATIVE NEGATIVE   Group A Strep by PCR   Specimen: Throat; Sterile Swab  Result Value Ref Range   Group A Strep by PCR NOT DETECTED NOT DETECTED      EKG None  Radiology No results found.  Procedures Procedures    Medications Ordered in ED Medications  acetaminophen (TYLENOL) tablet 1,000 mg (1,000 mg Oral Given 10/24/22 2106)    ED Course/ Medical Decision Making/ A&P                                 Medical Decision Making  Problems Addressed: Sore throat: acute illness or injury with systemic symptoms Viral URI: acute illness or injury with systemic symptoms  Amount and/or Complexity of Data Reviewed External Data Reviewed: notes. Labs: ordered. Decision-making details documented in ED Course.  Risk OTC drugs.   Labs sent.   Reviewed nursing notes and prior charts for additional history.   Labs reviewed/interpreted by me - strep neg. Covid neg.   On exam, no abscess or pta noted. No trouble breathing or swallowing.   Acetaminophen po.   Pt currently appears stable for d/c.  REC pcp f/u.   Return precautions provided.            Final Clinical Impression(s) / ED Diagnoses Final diagnoses:  None    Rx / DC Orders ED Discharge Orders     None         Cathren Laine, MD 10/24/22 2213

## 2022-10-24 NOTE — Discharge Instructions (Signed)
It was our pleasure to provide your ER care today - we hope that you feel better.  Drink plenty of fluids/stay well hydrated.  You may try myquil, mucinex, robitussin or similar over the counter cold and flu medication for symptom relief.   Follow up with primary care doctor in two weeks if symptoms fail to improve/resolve.  Return to ER if worse, new symptoms, increased trouble breathing, or other emergency concern.

## 2022-10-24 NOTE — ED Triage Notes (Signed)
First took semaglutide on Tues.  Has had a sore throat since then and her body feels hot but she feels cold.  Feels she is dehydrated. Has not been eating or drinking well.

## 2022-10-27 NOTE — Telephone Encounter (Signed)
HST- [facility] [employer] blue no auth req via fax form

## 2022-11-11 ENCOUNTER — Ambulatory Visit: Payer: BC Managed Care – PPO | Admitting: Neurology

## 2022-11-11 DIAGNOSIS — G4733 Obstructive sleep apnea (adult) (pediatric): Secondary | ICD-10-CM | POA: Diagnosis not present

## 2022-11-11 DIAGNOSIS — R519 Headache, unspecified: Secondary | ICD-10-CM

## 2022-11-13 NOTE — Procedures (Signed)
[facility]  HOME SLEEP TEST (Watch PAT) REPORT  STUDY DATE: 11/11/2022  DOB: 29-Dec-2000  MRN: [id number]  ORDERING CLINICIAN: Huston Foley, MD, PhD   REFERRING CLINICIAN: Dr. Naomie Dean; Margie Ege, NP  CLINICAL INFORMATION/HISTORY: 22 year old female with an underlying medical history of PCOS, prediabetes, recurrent headaches and severe obesity with a BMI of over 40, who reports snoring and excessive daytime somnolence as well as nonrestorative sleep and morning headaches.   Epworth sleepiness score: 15/24.  BMI: 48 kg/m  FINDINGS:   Sleep Summary:   Total Recording Time (hours, min): 8 hours, 41 min  Total Sleep Time (hours, min):  7 hours, 3 min  Percent REM (%):    20.7%   Respiratory Indices:   Calculated pAHI (per hour):  7.5/hour         REM pAHI:    10/hour       NREM pAHI: 6.8/hour  Central pAHI: 1.5/hour  Oxygen Saturation Statistics:    Oxygen Saturation (%) Mean: 94%   Minimum oxygen saturation (%):                 87%   O2 Saturation Range (%): 87-98%    O2 Saturation (minutes) <=88%: 0.2 min  Pulse Rate Statistics:   Pulse Mean (bpm):    79/min    Pulse Range (61-107/min)   IMPRESSION: OSA (obstructive sleep apnea), mild  RECOMMENDATION:  This home sleep test demonstrates overall mild obstructive sleep apnea with a total AHI of 7.5/hour and O2 nadir of 87%. Snoring was detected, and ranged from mild to moderate. Given the patient's medical history and sleep related complaints, therapy with a  positive airway pressure device can be considered in the form of AutoPap therapy at home.  I would recommend a treatment setting of 6 to 11 cm with EPR of 2, mask of choice, sized to fit.  A full night, in-lab PAP titration study may aid in improving proper treatment settings and with mask fit, if needed, down the road. Alternative treatments may include weight loss (where appropriate) along with avoidance of the supine sleep  position (if possible), or an oral appliance in appropriate candidates.   Please note that untreated obstructive sleep apnea may carry additional perioperative morbidity. Patients with significant obstructive sleep apnea should receive perioperative PAP therapy and the surgeons and particularly the anesthesiologist should be informed of the diagnosis and the severity of the sleep disordered breathing. The patient should be cautioned not to drive, work at heights, or operate dangerous or heavy equipment when tired or sleepy. Review and reiteration of good sleep hygiene measures should be pursued with any patient. Other causes of the patient's symptoms, including circadian rhythm disturbances, an underlying mood disorder, medication effect and/or an underlying medical problem cannot be ruled out based on this test. Clinical correlation is recommended.  The patient and her referring provider will be notified of the test results. The patient will be seen in follow up in sleep clinic at [facility], as necessary.  I certify that I have reviewed the raw data recording prior to the issuance of this report in accordance with the standards of the American Academy of Sleep Medicine (AASM).    INTERPRETING PHYSICIAN:   Huston Foley, MD, PhD Medical Director, [facility]) Diplomat, ABPN (Neurology and Sleep)   [facility] [address], [address] [place], [place] [address] [phone]

## 2022-11-14 DIAGNOSIS — Z3141 Encounter for fertility testing: Secondary | ICD-10-CM | POA: Diagnosis not present

## 2022-11-14 DIAGNOSIS — E282 Polycystic ovarian syndrome: Secondary | ICD-10-CM | POA: Diagnosis not present

## 2022-11-14 DIAGNOSIS — Z3202 Encounter for pregnancy test, result negative: Secondary | ICD-10-CM | POA: Diagnosis not present

## 2022-11-18 ENCOUNTER — Telehealth: Payer: Self-pay | Admitting: Neurology

## 2022-11-18 DIAGNOSIS — G4733 Obstructive sleep apnea (adult) (pediatric): Secondary | ICD-10-CM

## 2022-11-18 NOTE — Telephone Encounter (Signed)
Please call, HST 11/11/2022 showed overall mild OSA with total AHI of 7.5/hour and O2 nadir of 87%.  Snoring was mild to moderate.  Would recommend trial of CPAP AutoPap given symptom compliant.  Will start 6-11 cm water with a EPR of 2.  Weight loss as well as supine sleep would be helpful in the long-term.  Will need to be scheduled for revisit within window of starting for compliance, please review compliance requirements of 70% nightly usage for 4 or more hours.   Orders Placed This Encounter  Procedures   For home use only DME continuous positive airway pressure (CPAP)

## 2022-11-18 NOTE — Telephone Encounter (Signed)
Call to patient and reviewed HST results. Patient in agreement to try trial CPAP. She did ask if possible to have mask that fits below the nose. Advised I would let Maralyn Sago know. Virtual initial cpap visit scheduled for 02/18/2023 at 2:30 pm.

## 2022-11-18 NOTE — Telephone Encounter (Signed)
Community message to [facility] sent via epic for New CPAP start.

## 2023-01-10 DIAGNOSIS — H6122 Impacted cerumen, left ear: Secondary | ICD-10-CM | POA: Diagnosis not present

## 2023-02-02 DIAGNOSIS — Z202 Contact with and (suspected) exposure to infections with a predominantly sexual mode of transmission: Secondary | ICD-10-CM | POA: Diagnosis not present

## 2023-02-10 DIAGNOSIS — N760 Acute vaginitis: Secondary | ICD-10-CM | POA: Diagnosis not present

## 2023-02-10 DIAGNOSIS — B9789 Other viral agents as the cause of diseases classified elsewhere: Secondary | ICD-10-CM | POA: Diagnosis not present

## 2023-02-17 NOTE — Progress Notes (Signed)
 Patient: Peggy Garza Date of Birth: [birth year]  Reason for Visit: Follow up History from: Patient Primary Neurologist: Ines   Virtual Visit via Video Note  I connected with Peggy Garza on 02/18/23 at  2:30 PM EST by a video enabled telemedicine application and verified that I am speaking with the correct person using two identifiers.  Location: Patient: at her home  Provider: in the office    I discussed the limitations of evaluation and management by telemedicine and the availability of in person appointments. The patient expressed understanding and agreed to proceed.  ASSESSMENT AND PLAN 23 y.o. year old female   1.  Chronic migraine headaches 2.  Morning headache 3.  Daytime fatigue 4.  Obesity, BMI 48 5.  Mild OSA  -Has not started CPAP, HST October 2024 showed overall mild OSA with total AHI 7.5/hour and O2 nadir 87%.  Snoring was mild to moderate.  We had ordered a trial of CPAP.  We will reach out to DME to see if anything is needed on our end.  Otherwise, recommend she be given a trial of CPAP 6-11 cm water with a EPR of 2. -She wishes to restart Topamax , will start 50 mg at bedtime.  Have discussed that we do not recommend pregnancy as this medication can be teratogenic.  Recommend she use contraception. -For acute headache, may use Maxalt  10 mg as needed.  Careful not to overtreat headaches more than 2 to 3 days a week to cause rebound headache -Follow-up in 6 months or sooner if needed, sooner for CPAP initial visit once starting  Meds ordered this encounter  Medications   topiramate  (TOPAMAX ) 50 MG tablet    Sig: Take 1 tablet (50 mg total) by mouth at bedtime.    Dispense:  30 tablet    Refill:  5   HISTORY OF PRESENT ILLNESS: Today 02/18/23 HST 11/11/2022 showed overall mild OSA with total AHI of 7.5/hour and O2 nadir of 87%.  Snoring was mild to moderate. Has not started her CPAP yet. Continues with morning headache, daytime fatigue, snoring.  Is no  longer trying to get  pregnant, does not have a partner. 3 migraines a week, 4-5 moderate headaches weekly. Takes OTC Tylenol  or Motrin for relief. Has not needed the Maxalt . Migraines are frontal, right sided, with migraine features. Not currently on anything for weight loss.  Would like to go back on Topamax  for migraine preventative.  10/16/22 SS: Saw Dr. Ines September 2022 for headaches.  Referred for sleep consult. Given Maxalt  and Topamax .  MRI of the brain with and without contrast was normal October 2022.  Sleep consult January 2023 with Dr. Buck. She never did the sleep study, didn't get around to getting it. Waking up with headache, snoring is less, suboptimal energy level, non-restorative sleep. 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 Waking up in the morning with headache, right frontal, behind right eye. Will take Tylenol  or Motrin with good benefit. Also had migraines, once a week, generalized, pain is severe, sensitive to light or sound, no vomiting. Takes BC powder, it helps. Has gained 30 lbs since last seen, BMI 48. Would like to restart Topamax , Maxalt . Is trying to get pregnant currently, no children currently. ESS 8.  HISTORY  10/23/20 Dr. Ines: HPI:  Peggy Garza is a 23 y.o. female here as requested by Darene Josette POUR, NP for headaches.   She wakes up with headaches and that is when they are the  worst. She wakes up with them. She may try OTC from time to time but most times no medications. Ongoing for several years. She has gained weight. Mother and grandmother have sleep apnea. Mother has migraines and had a tumor on the brain that is being monitored. She has headache most of the day. They are all over the head. She also has another headache that is more migrainous, pulsating/pounding/throbbing, light and sound sensitivity, mild nausea, a dark room room makes it better, unknown triggers. Daily headaches. Worse in the morning and when laying down. She has  migraines 8-12 days a month. No vision changes, no aura, no medication overuse but when she gets the headaches the vision can change to blurriness, does not wake her up but wakes up with headaches. Never feels refreshed. Takes naps throughout the day.No other focal neurologic deficits, associated symptoms, inciting events or modifiable factors.   Reviewed notes, labs and imaging from outside physicians, which showed:   Meds tried: Amitriptyline/nortriptyline, propranol is contraindicated due to low blood presure.    CMP unremarkable 08/23/2020 Hgba1c 5.9  REVIEW OF SYSTEMS: Out of a complete 14 system review of symptoms, the patient complains only of the following symptoms, and all other reviewed systems are negative.  See HPI  ALLERGIES: Allergies  Allergen Reactions   Victoza  [Liraglutide ] Other (See Comments)    Patient notes side effects of sore throat and hoarseness, however nurse documentation reports that the patient stated that her throat was closing.  Patient never sought emergency care and never had problems breathing.    HOME MEDICATIONS: Outpatient Medications Prior to Visit  Medication Sig Dispense Refill   acyclovir (ZOVIRAX) 200 MG/5ML suspension Take 800 mg by mouth 3 (three) times daily as needed (Outbreak).     albuterol  (VENTOLIN  HFA) 108 (90 Base) MCG/ACT inhaler Inhale 2 puffs into the lungs every 6 (six) hours as needed for wheezing or shortness of breath (Cough). 18 g 0   cetirizine  (ZYRTEC ) 10 MG tablet Take 10 mg by mouth daily as needed for allergies.     fluticasone  (FLONASE ) 50 MCG/ACT nasal spray Place 2 sprays into both nostrils daily. (Patient not taking: Reported on 03/25/2022) 18 mL 0   ipratropium (ATROVENT ) 0.06 % nasal spray Place 2 sprays into both nostrils 4 (four) times daily. As needed for nasal congestion, runny nose (Patient not taking: Reported on 03/25/2022) 15 mL 0   ketoconazole (NIZORAL) 2 % shampoo Apply 1 Application topically daily as needed  for irritation.     Liraglutide  -Weight Management (SAXENDA ) 18 MG/3ML SOPN Inject 0.6 mg into the skin daily. 3 mL 1   medroxyPROGESTERone  (PROVERA ) 10 MG tablet Take 1 tablet (10 mg total) by mouth daily. Take first 10 days of every month 30 tablet 3   oxyCODONE  (OXY IR/ROXICODONE ) 5 MG immediate release tablet Take 1 tablet (5 mg total) by mouth every 6 (six) hours as needed for severe pain. 20 tablet 0   rizatriptan  (MAXALT ) 10 MG tablet Take 1 tablet (10 mg total) by mouth as needed for migraine. May repeat in 2 hours if needed 10 tablet 2   topiramate  (TOPAMAX ) 50 MG tablet Take 0.5 tablets (25 mg total) by mouth 2 (two) times daily. (Patient not taking: Reported on 03/25/2022) 30 tablet 0   Vitamin D , Ergocalciferol , (DRISDOL ) 1.25 MG (50000 UNIT) CAPS capsule Take 1 capsule (50,000 Units total) by mouth every 7 (seven) days. (Patient not taking: Reported on 03/25/2022) 8 capsule 0   No facility-administered medications prior  to visit.    PAST MEDICAL HISTORY: Past Medical History:  Diagnosis Date   Genital herpes    Headache    03/25/22- has not had in a while   Other fatigue    PCOS (polycystic ovarian syndrome)    Pre-diabetes    Prediabetes    Shortness of breath on exertion     PAST SURGICAL HISTORY: Past Surgical History:  Procedure Laterality Date   BREAST CYST EXCISION Bilateral 03/27/2022   Procedure: EXCISION OF BILATERAL BREAST CHRONIC ABSCESS;  Surgeon: Aron Shoulders, MD;  Location: [facility] OR;  Service: General;  Laterality: Bilateral;   TONSILLECTOMY      FAMILY HISTORY: Family History  Problem Relation Age of Onset   Hypertension Mother    Thyroid  disease Mother    Sleep apnea Mother    Obesity Mother    Migraines Mother    Obesity Father    Sleep apnea Maternal Grandmother     SOCIAL HISTORY: Social History   Socioeconomic History   Marital status: Single    Spouse name: Not on file   Number of children: Not on file   Years of education: Not on file    Highest education level: Not on file  Occupational History   Occupation: public relations account executive  Tobacco Use   Smoking status: Never   Smokeless tobacco: Never  Vaping Use   Vaping status: Never Used  Substance and Sexual Activity   Alcohol use: Not Currently    Comment: occasionally   Drug use: Yes    Types: Marijuana   Sexual activity: Yes    Birth control/protection: None  Other Topics Concern   Not on file  Social History Narrative   Not on file   Social Drivers of Health   Financial Resource Strain: Not on file  Food Insecurity: Not on file  Transportation Needs: Not on file  Physical Activity: Not on file  Stress: Not on file  Social Connections: Unknown (06/25/2021)   Received from [facility], [facility]   Social Network    Social Network: Not on file  Intimate Partner Violence: Unknown (05/16/2021)   Received from [facility], [facility]   HITS    Physically Hurt: Not on file    Insult or Talk Down To: Not on file    Threaten Physical Harm: Not on file    Scream or Curse: Not on file    PHYSICAL EXAM  Via video visit, is alert and oriented, speech is clear and concise, facial symmetry noted, moves about freely.  DIAGNOSTIC DATA (LABS, IMAGING, TESTING) - I reviewed patient records, labs, notes, testing and imaging myself where available.  Lab Results  Component Value Date   WBC 7.9 03/27/2022   HGB 12.7 03/27/2022   HCT 40.1 03/27/2022   MCV 85.3 03/27/2022   PLT 233 03/27/2022      Component Value Date/Time   NA 142 06/12/2021 0914   K 4.3 06/12/2021 0914   CL 109 (H) 06/12/2021 0914   CO2 22 06/12/2021 0914   GLUCOSE 87 06/12/2021 0914   BUN 8 06/12/2021 0914   CREATININE 0.97 06/12/2021 0914   CALCIUM 9.1 06/12/2021 0914   PROT 6.9 06/12/2021 0914   ALBUMIN 3.5 (L) 06/12/2021 0914   AST 36 06/12/2021 0914   ALT 15 06/12/2021 0914   ALKPHOS 53 06/12/2021 0914   BILITOT <0.2 06/12/2021 0914   GFRNONAA 107 03/06/2020 1012   GFRAA  124 03/06/2020 1012   Lab Results  Component Value  Date   CHOL 150 03/06/2020   HDL 43 03/06/2020   LDLCALC 88 03/06/2020   TRIG 103 03/06/2020   Lab Results  Component Value Date   HGBA1C 5.8 (H) 06/12/2021   Lab Results  Component Value Date   VITAMINB12 262 03/06/2020   Lab Results  Component Value Date   TSH 1.790 10/23/2020    Peggy Garza, AGNP-C, DNP 02/18/2023, 2:43 PM [facility] [address] [place], [place] [address] [phone]

## 2023-02-18 ENCOUNTER — Telehealth: Payer: BC Managed Care – PPO | Admitting: Neurology

## 2023-02-18 ENCOUNTER — Telehealth: Payer: Self-pay

## 2023-02-18 DIAGNOSIS — G43709 Chronic migraine without aura, not intractable, without status migrainosus: Secondary | ICD-10-CM | POA: Diagnosis not present

## 2023-02-18 DIAGNOSIS — G4733 Obstructive sleep apnea (adult) (pediatric): Secondary | ICD-10-CM

## 2023-02-18 DIAGNOSIS — Z6841 Body Mass Index (BMI) 40.0 and over, adult: Secondary | ICD-10-CM

## 2023-02-18 DIAGNOSIS — E669 Obesity, unspecified: Secondary | ICD-10-CM | POA: Diagnosis not present

## 2023-02-18 MED ORDER — TOPIRAMATE 50 MG PO TABS: 50.0000 mg | ORAL_TABLET | Freq: Every day | ORAL | 5 refills | Status: DC

## 2023-02-18 NOTE — Telephone Encounter (Signed)
 Called [facility] to get status of enrollment for cpap process. They mentioned that they have called her and pt hasn't answered calls to establish cpap care.

## 2023-02-18 NOTE — Patient Instructions (Signed)
 Recommend starting CPAP.  Will reach out to your DME to see if anything is needed on our end.  We will start Topamax  50 mg at bedtime for migraine prevention.  We do not recommend pregnancy, please use contraception.  You can use Maxalt  for severe headache.  Careful not to overtreat headache causing rebound headache.  Avoid treating headache more than 2 to 3 days a week.  Follow-up in 6 months or sooner if needed.

## 2023-02-18 NOTE — Telephone Encounter (Signed)
-----   Message from Glean Salvo sent at 02/18/2023  2:57 PM EST ----- Please reach out to DME to see if anything is needed on our end to start CPAP.  I had previously ordered CPAP back in October.  Patient has yet to start.  Thanks, Maralyn Sago

## 2023-02-23 DIAGNOSIS — F4312 Post-traumatic stress disorder, chronic: Secondary | ICD-10-CM | POA: Diagnosis not present

## 2023-02-23 DIAGNOSIS — F322 Major depressive disorder, single episode, severe without psychotic features: Secondary | ICD-10-CM | POA: Diagnosis not present

## 2023-02-26 DIAGNOSIS — Z7251 High risk heterosexual behavior: Secondary | ICD-10-CM | POA: Diagnosis not present

## 2023-03-02 DIAGNOSIS — F322 Major depressive disorder, single episode, severe without psychotic features: Secondary | ICD-10-CM | POA: Diagnosis not present

## 2023-03-02 DIAGNOSIS — F4312 Post-traumatic stress disorder, chronic: Secondary | ICD-10-CM | POA: Diagnosis not present

## 2023-03-02 DIAGNOSIS — G4733 Obstructive sleep apnea (adult) (pediatric): Secondary | ICD-10-CM | POA: Diagnosis not present

## 2023-03-09 DIAGNOSIS — F322 Major depressive disorder, single episode, severe without psychotic features: Secondary | ICD-10-CM | POA: Diagnosis not present

## 2023-03-09 DIAGNOSIS — F4312 Post-traumatic stress disorder, chronic: Secondary | ICD-10-CM | POA: Diagnosis not present

## 2023-03-16 DIAGNOSIS — F4312 Post-traumatic stress disorder, chronic: Secondary | ICD-10-CM | POA: Diagnosis not present

## 2023-03-16 DIAGNOSIS — F322 Major depressive disorder, single episode, severe without psychotic features: Secondary | ICD-10-CM | POA: Diagnosis not present

## 2023-03-23 DIAGNOSIS — F4312 Post-traumatic stress disorder, chronic: Secondary | ICD-10-CM | POA: Diagnosis not present

## 2023-03-23 DIAGNOSIS — F322 Major depressive disorder, single episode, severe without psychotic features: Secondary | ICD-10-CM | POA: Diagnosis not present

## 2023-03-24 DIAGNOSIS — N898 Other specified noninflammatory disorders of vagina: Secondary | ICD-10-CM | POA: Diagnosis not present

## 2023-03-24 DIAGNOSIS — R8290 Unspecified abnormal findings in urine: Secondary | ICD-10-CM | POA: Diagnosis not present

## 2023-03-24 DIAGNOSIS — B3731 Acute candidiasis of vulva and vagina: Secondary | ICD-10-CM | POA: Diagnosis not present

## 2023-03-27 DIAGNOSIS — B3732 Chronic candidiasis of vulva and vagina: Secondary | ICD-10-CM | POA: Diagnosis not present

## 2023-03-30 DIAGNOSIS — F322 Major depressive disorder, single episode, severe without psychotic features: Secondary | ICD-10-CM | POA: Diagnosis not present

## 2023-03-30 DIAGNOSIS — F4312 Post-traumatic stress disorder, chronic: Secondary | ICD-10-CM | POA: Diagnosis not present

## 2023-04-03 ENCOUNTER — Encounter: Payer: Self-pay | Admitting: Family Medicine

## 2023-04-03 ENCOUNTER — Ambulatory Visit (INDEPENDENT_AMBULATORY_CARE_PROVIDER_SITE_OTHER): Payer: BC Managed Care – PPO | Admitting: Family Medicine

## 2023-04-03 VITALS — BP 120/84 | HR 79 | Temp 97.6°F | Ht 62.0 in | Wt 242.2 lb

## 2023-04-03 DIAGNOSIS — R7303 Prediabetes: Secondary | ICD-10-CM

## 2023-04-03 DIAGNOSIS — N926 Irregular menstruation, unspecified: Secondary | ICD-10-CM | POA: Diagnosis not present

## 2023-04-03 DIAGNOSIS — Z136 Encounter for screening for cardiovascular disorders: Secondary | ICD-10-CM | POA: Diagnosis not present

## 2023-04-03 DIAGNOSIS — E049 Nontoxic goiter, unspecified: Secondary | ICD-10-CM

## 2023-04-03 DIAGNOSIS — G4733 Obstructive sleep apnea (adult) (pediatric): Secondary | ICD-10-CM | POA: Diagnosis not present

## 2023-04-03 DIAGNOSIS — J4520 Mild intermittent asthma, uncomplicated: Secondary | ICD-10-CM

## 2023-04-03 DIAGNOSIS — E282 Polycystic ovarian syndrome: Secondary | ICD-10-CM

## 2023-04-03 DIAGNOSIS — E559 Vitamin D deficiency, unspecified: Secondary | ICD-10-CM

## 2023-04-03 DIAGNOSIS — R739 Hyperglycemia, unspecified: Secondary | ICD-10-CM

## 2023-04-03 LAB — VITAMIN D 25 HYDROXY (VIT D DEFICIENCY, FRACTURES): VITD: 28.2 ng/mL — ABNORMAL LOW (ref 30.00–100.00)

## 2023-04-03 LAB — POCT URINALYSIS DIPSTICK
Bilirubin, UA: NEGATIVE
Blood, UA: NEGATIVE
Glucose, UA: NEGATIVE
Ketones, UA: NEGATIVE
Leukocytes, UA: NEGATIVE
Nitrite, UA: NEGATIVE
Protein, UA: NEGATIVE
Spec Grav, UA: 1.015 (ref 1.010–1.025)
Urobilinogen, UA: 0.2 U/dL
pH, UA: 6 (ref 5.0–8.0)

## 2023-04-03 LAB — LIPID PANEL
Cholesterol: 148 mg/dL (ref 0–200)
HDL: 40.5 mg/dL (ref >39.00)
LDL Cholesterol: 77 mg/dL (ref 0–99)
NonHDL: 107.41
Total CHOL/HDL Ratio: 4
Triglycerides: 152 mg/dL — ABNORMAL HIGH (ref 0.0–149.0)
VLDL: 30.4 mg/dL (ref 0.0–40.0)

## 2023-04-03 LAB — CBC WITH DIFFERENTIAL/PLATELET
Basophils Absolute: 0 10*3/uL (ref 0.0–0.1)
Basophils Relative: 0.6 % (ref 0.0–3.0)
Eosinophils Absolute: 0.1 10*3/uL (ref 0.0–0.7)
Eosinophils Relative: 1.7 % (ref 0.0–5.0)
HCT: 39.1 % (ref 36.0–46.0)
Hemoglobin: 12.5 g/dL (ref 12.0–15.0)
Lymphocytes Relative: 43 % (ref 12.0–46.0)
Lymphs Abs: 2.3 10*3/uL (ref 0.7–4.0)
MCHC: 32.1 g/dL (ref 30.0–36.0)
MCV: 84.9 fL (ref 78.0–100.0)
Monocytes Absolute: 0.4 10*3/uL (ref 0.1–1.0)
Monocytes Relative: 7.9 % (ref 3.0–12.0)
Neutro Abs: 2.5 10*3/uL (ref 1.4–7.7)
Neutrophils Relative %: 46.8 % (ref 43.0–77.0)
Platelets: 230 10*3/uL (ref 150.0–400.0)
RBC: 4.61 Mil/uL (ref 3.87–5.11)
RDW: 15.8 % — ABNORMAL HIGH (ref 11.5–15.5)
WBC: 5.4 10*3/uL (ref 4.0–10.5)

## 2023-04-03 LAB — COMPREHENSIVE METABOLIC PANEL
ALT: 8 U/L (ref 0–35)
AST: 14 U/L (ref 0–37)
Albumin: 4.1 g/dL (ref 3.5–5.2)
Alkaline Phosphatase: 51 U/L (ref 39–117)
BUN: 11 mg/dL (ref 6–23)
CO2: 24 meq/L (ref 19–32)
Calcium: 9.2 mg/dL (ref 8.4–10.5)
Chloride: 104 meq/L (ref 96–112)
Creatinine, Ser: 1.01 mg/dL (ref 0.40–1.20)
GFR: 77.82 mL/min (ref >60.00)
Glucose, Bld: 87 mg/dL (ref 70–99)
Potassium: 3.6 meq/L (ref 3.5–5.1)
Sodium: 137 meq/L (ref 135–145)
Total Bilirubin: 0.4 mg/dL (ref 0.2–1.2)
Total Protein: 8.1 g/dL (ref 6.0–8.3)

## 2023-04-03 LAB — HEMOGLOBIN A1C: Hgb A1c MFr Bld: 5.8 % (ref 4.6–6.5)

## 2023-04-03 LAB — TSH: TSH: 0.84 u[IU]/mL (ref 0.35–5.50)

## 2023-04-03 LAB — POCT URINE PREGNANCY: Preg Test, Ur: NEGATIVE

## 2023-04-03 MED ORDER — VITAMIN D (ERGOCALCIFEROL) 1.25 MG (50000 UNIT) PO CAPS: 50000.0000 [IU] | ORAL_CAPSULE | ORAL | 0 refills | Status: DC

## 2023-04-03 NOTE — Addendum Note (Signed)
Addended by: Sherald Barge on: 04/03/2023 12:20 PM   Modules accepted: Orders

## 2023-04-03 NOTE — Patient Instructions (Signed)
Welcome to [facility]!  We are checking labs today, will be in contact with any results that require further attention  Results will automatically be available in MyChart. If there is anything that we need to follow up on, or re check, we will be calling to let you know.  I have referred you to GYN to get established, someone will be reaching out to get you scheduled.  Follow-up with me for new or worsening symptoms.

## 2023-04-03 NOTE — Progress Notes (Signed)
New Patient Office Visit  Subjective    Patient ID: Peggy Garza, female    DOB: 04-Apr-2000  Age: 23 y.o. MRN: [id number]  CC:  Chief Complaint  Patient presents with   Establish Care    Discuss pap smear, has PCOS    HPI Peggy Garza presents to establish care today. Has history of PCOS, prediabetes, asthma, HSV 1, depression, PTSD, vitamin D deficiency, obesity, chronic migraine, sleep apnea. She sees [facility] neurology for management of migraines. She has recently started taking Topamax 50 mg, states that since this has occurred she has had less of an appetite and some mild nausea. Has not spoken to neuro about this yet. Has a recent vaginal yeast infection. Is not established with gynecology, would like to do that today, due for Pap. Up-to-date on routine vaccines. Up-to-date on other routine screenings. She is not fasting today. Declines need for refills today. Medical history as outlined below.  Outpatient Encounter Medications as of 04/03/2023  Medication Sig   acyclovir (ZOVIRAX) 200 MG/5ML suspension Take 800 mg by mouth 3 (three) times daily as needed (Outbreak).   albuterol (VENTOLIN HFA) 108 (90 Base) MCG/ACT inhaler Inhale 2 puffs into the lungs every 6 (six) hours as needed for wheezing or shortness of breath (Cough).   fluticasone (FLONASE) 50 MCG/ACT nasal spray Place 2 sprays into both nostrils daily.   ipratropium (ATROVENT) 0.06 % nasal spray Place 2 sprays into both nostrils 4 (four) times daily. As needed for nasal congestion, runny nose   rizatriptan (MAXALT) 10 MG tablet Take 1 tablet (10 mg total) by mouth as needed for migraine. May repeat in 2 hours if needed   topiramate (TOPAMAX) 50 MG tablet Take 1 tablet (50 mg total) by mouth at bedtime.   Vitamin D, Ergocalciferol, (DRISDOL) 1.25 MG (50000 UNIT) CAPS capsule Take 1 capsule (50,000 Units total) by mouth every 7 (seven) days.   [DISCONTINUED] cetirizine (ZYRTEC) 10 MG tablet Take 10 mg by mouth  daily as needed for allergies. (Patient not taking: Reported on 04/03/2023)   [DISCONTINUED] ketoconazole (NIZORAL) 2 % shampoo Apply 1 Application topically daily as needed for irritation.   [DISCONTINUED] Liraglutide -Weight Management (SAXENDA) 18 MG/3ML SOPN Inject 0.6 mg into the skin daily. (Patient not taking: Reported on 04/03/2023)   [DISCONTINUED] medroxyPROGESTERone (PROVERA) 10 MG tablet Take 1 tablet (10 mg total) by mouth daily. Take first 10 days of every month   [DISCONTINUED] oxyCODONE (OXY IR/ROXICODONE) 5 MG immediate release tablet Take 1 tablet (5 mg total) by mouth every 6 (six) hours as needed for severe pain. (Patient not taking: Reported on 04/03/2023)   No facility-administered encounter medications on file as of 04/03/2023.    Past Medical History:  Diagnosis Date   Allergy    Victoza   Anemia    Asthma    Genital herpes    GERD (gastroesophageal reflux disease)    Headache    03/25/22- has not had in a while   Other fatigue    PCOS (polycystic ovarian syndrome)    Pre-diabetes    Prediabetes    Shortness of breath on exertion    Sleep apnea     Past Surgical History:  Procedure Laterality Date   BREAST CYST EXCISION Bilateral 03/27/2022   Procedure: EXCISION OF BILATERAL BREAST CHRONIC ABSCESS;  Surgeon: Almond Lint, MD;  Location: [facility] OR;  Service: General;  Laterality: Bilateral;   TONSILLECTOMY      Family History  Problem Relation Age of Onset  Hypertension Mother    Thyroid disease Mother    Sleep apnea Mother    Obesity Mother    Migraines Mother    Obesity Father    Sleep apnea Maternal Grandmother    Asthma Maternal Grandmother    Cancer Maternal Grandmother    Asthma Paternal Grandmother    Diabetes Paternal Grandmother     Social History   Socioeconomic History   Marital status: Single    Spouse name: Not on file   Number of children: Not on file   Years of education: Not on file   Highest education level: Bachelor's degree  (e.g., BA, AB, BS)  Occupational History   Occupation: Public relations account executive  Tobacco Use   Smoking status: Never   Smokeless tobacco: Never  Vaping Use   Vaping status: Never Used  Substance and Sexual Activity   Alcohol use: Yes    Comment: occasionally   Drug use: Not Currently    Types: Marijuana   Sexual activity: Yes    Birth control/protection: None  Other Topics Concern   Not on file  Social History Narrative   Not on file   Social Drivers of Health   Financial Resource Strain: Low Risk  (04/02/2023)   Overall Financial Resource Strain (CARDIA)    Difficulty of Paying Living Expenses: Not very hard  Food Insecurity: No Food Insecurity (04/02/2023)   Hunger Vital Sign    Worried About Running Out of Food in the Last Year: Never true    Ran Out of Food in the Last Year: Never true  Transportation Needs: No Transportation Needs (04/02/2023)   PRAPARE - Administrator, Civil Service (Medical): No    Lack of Transportation (Non-Medical): No  Physical Activity: Insufficiently Active (04/02/2023)   Exercise Vital Sign    Days of Exercise per Week: 2 days    Minutes of Exercise per Session: 30 min  Stress: No Stress Concern Present (04/02/2023)   [employer] of Occupational Health - Occupational Stress Questionnaire    Feeling of Stress : Only a little  Social Connections: Moderately Isolated (04/02/2023)   Social Connection and Isolation Panel [NHANES]    Frequency of Communication with Friends and Family: More than three times a week    Frequency of Social Gatherings with Friends and Family: Once a week    Attends Religious Services: More than 4 times per year    Active Member of [employer] or Organizations: No    Attends Engineer, structural: Not on file    Marital Status: Never married  Intimate Partner Violence: Unknown (05/16/2021)   Received from [employer], [facility]   HITS    Physically Hurt: Not on file    Insult or Talk Down To: Not on  file    Threaten Physical Harm: Not on file    Scream or Curse: Not on file    ROS Per HPI      Objective    BP 120/84 (BP Location: Left Arm, Patient Position: Sitting, Cuff Size: Normal)   Pulse 79   Temp 97.6 F (36.4 C) (Temporal)   Ht 5\' 2"  (1.575 m)   Wt 242 lb 3.2 oz (109.9 kg)   SpO2 99%   BMI 44.30 kg/m   Physical Exam Vitals and nursing note reviewed.  Constitutional:      General: She is not in acute distress.    Appearance: Normal appearance. She is obese.  HENT:     Head: Normocephalic and  atraumatic.     Right Ear: Tympanic membrane and ear canal normal.     Left Ear: Tympanic membrane and ear canal normal.     Nose: Nose normal.  Eyes:     Extraocular Movements: Extraocular movements intact.     Pupils: Pupils are equal, round, and reactive to light.  Neck:     Comments: Enlarged thyroid Cardiovascular:     Rate and Rhythm: Normal rate and regular rhythm.     Pulses: Normal pulses.     Heart sounds: Normal heart sounds. No murmur heard. Pulmonary:     Effort: Pulmonary effort is normal. No respiratory distress.     Breath sounds: Normal breath sounds. No wheezing, rhonchi or rales.  Musculoskeletal:        General: Normal range of motion.     Cervical back: Normal range of motion.  Lymphadenopathy:     Cervical: No cervical adenopathy.  Skin:    General: Skin is warm and dry.  Neurological:     General: No focal deficit present.     Mental Status: She is alert and oriented to person, place, and time.  Psychiatric:        Mood and Affect: Mood normal.        Thought Content: Thought content normal.         Assessment & Plan:   PCOS (polycystic ovarian syndrome) -     CBC with Differential/Platelet -     Ambulatory referral to Obstetrics / Gynecology  Vitamin D deficiency -     VITAMIN D 25 Hydroxy (Vit-D Deficiency, Fractures)  Hyperglycemia -     Comprehensive metabolic panel -     Hemoglobin A1c -     POCT urinalysis  dipstick  Prediabetes -     Hemoglobin A1c  OSA (obstructive sleep apnea) -     CBC with Differential/Platelet -     Comprehensive metabolic panel  Encounter for screening for cardiovascular disorders -     Lipid panel  Enlarged thyroid -     TSH -     US THYROID; Future  Irregular periods -     hCG, serum, qualitative; Future -     TSH -     POCT urine pregnancy -     POCT urinalysis dipstick -     Ambulatory referral to Obstetrics / Gynecology  Mild intermittent asthma without complication  Continue prn albuterol  Continue current medication regimen  Return for as needed.   Moshe Cipro, FNP

## 2023-04-06 DIAGNOSIS — F322 Major depressive disorder, single episode, severe without psychotic features: Secondary | ICD-10-CM | POA: Diagnosis not present

## 2023-04-06 DIAGNOSIS — F4312 Post-traumatic stress disorder, chronic: Secondary | ICD-10-CM | POA: Diagnosis not present

## 2023-04-09 DIAGNOSIS — B3732 Chronic candidiasis of vulva and vagina: Secondary | ICD-10-CM | POA: Diagnosis not present

## 2023-04-13 DIAGNOSIS — F4312 Post-traumatic stress disorder, chronic: Secondary | ICD-10-CM | POA: Diagnosis not present

## 2023-04-13 DIAGNOSIS — F331 Major depressive disorder, recurrent, moderate: Secondary | ICD-10-CM | POA: Diagnosis not present

## 2023-04-14 ENCOUNTER — Encounter: Payer: Self-pay | Admitting: Family Medicine

## 2023-04-14 ENCOUNTER — Ambulatory Visit
Admission: RE | Admit: 2023-04-14 | Discharge: 2023-04-14 | Disposition: A | Discharge status: Home / Self Care | Payer: BC Managed Care – PPO | Source: Ambulatory Visit | Attending: Family Medicine | Admitting: Family Medicine

## 2023-04-14 DIAGNOSIS — E049 Nontoxic goiter, unspecified: Secondary | ICD-10-CM

## 2023-04-14 DIAGNOSIS — R221 Localized swelling, mass and lump, neck: Secondary | ICD-10-CM | POA: Diagnosis not present

## 2023-04-20 DIAGNOSIS — F4312 Post-traumatic stress disorder, chronic: Secondary | ICD-10-CM | POA: Diagnosis not present

## 2023-04-20 DIAGNOSIS — F331 Major depressive disorder, recurrent, moderate: Secondary | ICD-10-CM | POA: Diagnosis not present

## 2023-05-04 DIAGNOSIS — F331 Major depressive disorder, recurrent, moderate: Secondary | ICD-10-CM | POA: Diagnosis not present

## 2023-05-04 DIAGNOSIS — F4312 Post-traumatic stress disorder, chronic: Secondary | ICD-10-CM | POA: Diagnosis not present

## 2023-05-04 NOTE — Progress Notes (Unsigned)
 Patient: Peggy Garza Date of Birth: 09/20/2000  Reason for Visit: Follow up History from: Patient Primary Neurologist: Ahern-migraines/Athar-OSA  ASSESSMENT AND PLAN 23 y.o. year old female   1.  Chronic migraine headaches  -She would like to try increasing Topamax to help with migraines, continue weight loss.  On average 1 migraine every 2 weeks, has lost 30 pounds -Increase Topamax to 100 mg at bedtime for migraine prevention.  I discussed that we do not recommend becoming pregnant on the medication as it can be teratogenic.  She is not in a relationship and not currently sexually active.  I will send in folic acid 1 mg daily.  Topamax has been very helpful for her, she wishes to remain on this. -Continue Maxalt 10 mg as needed for severe headache  2.  Morning headache 3.  Daytime fatigue 4.  Obesity, BMI 48 5.  Mild OSA  -HST October 2024 showed overall mild OSA with total AHI 7.5/hour and O2 nadir 87%. Snoring was mild to moderate  -She will be diligent to try to use CPAP nightly for minimum 4 hours.  She continues with morning headache, daytime fatigue.  If she does not notice any change with CPAP, we may consider a repeat HST since she has lost 30 pounds at this point. Her data shows a good fitting mask, well controlled AHI 1.1. Encouraged her to follow up with my sooner about CPAP if any issues  -Follow up in 6 months with me  Meds ordered this encounter  Medications   DISCONTD: topiramate (TOPAMAX) 50 MG tablet    Sig: Take 1 tablet (50 mg total) by mouth at bedtime.    Dispense:  30 tablet    Refill:  5   folic acid (FOLVITE) 1 MG tablet    Sig: Take 1 tablet (1 mg total) by mouth daily.    Dispense:  30 tablet    Refill:  5   topiramate (TOPAMAX) 100 MG tablet    Sig: Take 1 tablet (100 mg total) by mouth at bedtime.    Dispense:  30 tablet    Refill:  5    Cancel the 50 mg topamax   HISTORY OF PRESENT ILLNESS: Today 05/05/23 CPAP report 03/06/2023-05/04/23  shows 63% usage, greater than 4 hours 53%.  Average usage days used 4 hours 59 minutes. 6-11 centimeters water.  EPR 2.  Leak 6.2, AHI 1.1.  We could not pull a download through [employer], had to call and have it faxed over. Trying to use CPAP, it wakes her up at night, not sure why? Using FFM. Goes to bed around midnight, waking up 2-3 times at night due to CPAP, wakes up around 8: 30 AM. Continues with morning headache, but not as often. Still tired during the day. Is now on Topamax 50 mg daily for migraine, no longer having 3 migraines a week, now 1 every 2 weeks. Has not needed the Maxalt, able to continue with activity. Has lost about 30 lbs, Topamax has helped her lose weight.  Very pleased with this.  No longer planning for pregnancy, is not in a relationship or sexually active.  Update 02/18/23 SS: HST 11/11/2022 showed overall mild OSA with total AHI of 7.5/hour and O2 nadir of 87%.  Snoring was mild to moderate. Has not started her CPAP yet. Continues with morning headache, daytime fatigue, snoring.  Is no longer trying to get  pregnant, does not have a partner. 3 migraines a week, 4-5 moderate headaches weekly.  Takes OTC Tylenol or Motrin for relief. Has not needed the Maxalt. Migraines are frontal, right sided, with migraine features. Not currently on anything for weight loss.  Would like to go back on Topamax for migraine preventative.   Update 10/16/22 SS: Saw Dr. Lucia Gaskins September 2022 for headaches.  Referred for sleep consult. Given Maxalt and Topamax.  MRI of the brain with and without contrast was normal October 2022.  Sleep consult January 2023 with Dr. Frances Furbish. She never did the sleep study, didn't get around to getting it. Waking up with headache, snoring is less, suboptimal energy level, non-restorative sleep. 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 Waking up in the morning with headache, right frontal, behind right eye. Will take Tylenol or Motrin with good benefit. Also had  migraines, once a week, generalized, pain is severe, sensitive to light or sound, no vomiting. Takes BC powder, it helps. Has gained 30 lbs since last seen, BMI 48. Would like to restart Topamax, Maxalt. Is trying to get pregnant currently, no children currently. ESS 8.  HISTORY  10/23/20 Dr. Lucia Gaskins: HPI:  Peggy Garza is a 23 y.o. female here as requested by Vonna Kotyk, NP for headaches.   She wakes up with headaches and that is when they are the worst. She wakes up with them. She may try OTC from time to time but most times no medications. Ongoing for several years. She has gained weight. Mother and grandmother have sleep apnea. Mother has migraines and had a tumor on the brain that is being monitored. She has headache most of the day. They are all over the head. She also has another headache that is more migrainous, pulsating/pounding/throbbing, light and sound sensitivity, mild nausea, a dark room room makes it better, unknown triggers. Daily headaches. Worse in the morning and when laying down. She has migraines 8-12 days a month. No vision changes, no aura, no medication overuse but when she gets the headaches the vision can change to blurriness, does not wake her up but wakes up with headaches. Never feels refreshed. Takes naps throughout the day.No other focal neurologic deficits, associated symptoms, inciting events or modifiable factors.   Reviewed notes, labs and imaging from outside physicians, which showed:   Meds tried: Amitriptyline/nortriptyline, propranol is contraindicated due to low blood presure.    CMP unremarkable 08/23/2020 Hgba1c 5.9  REVIEW OF SYSTEMS: Out of a complete 14 system review of symptoms, the patient complains only of the following symptoms, and all other reviewed systems are negative.  See HPI  ALLERGIES: Allergies  Allergen Reactions   Victoza [Liraglutide] Other (See Comments)    Patient notes side effects of sore throat and hoarseness, however  nurse documentation reports that the patient stated that her throat was closing.  Patient never sought emergency care and never had problems breathing.    HOME MEDICATIONS: Outpatient Medications Prior to Visit  Medication Sig Dispense Refill   acyclovir (ZOVIRAX) 200 MG/5ML suspension Take 800 mg by mouth 3 (three) times daily as needed (Outbreak).     albuterol (VENTOLIN HFA) 108 (90 Base) MCG/ACT inhaler Inhale 2 puffs into the lungs every 6 (six) hours as needed for wheezing or shortness of breath (Cough). 18 g 0   rizatriptan (MAXALT) 10 MG tablet Take 1 tablet (10 mg total) by mouth as needed for migraine. May repeat in 2 hours if needed 10 tablet 2   topiramate (TOPAMAX) 50 MG tablet Take 1 tablet (50 mg total) by mouth  at bedtime. 30 tablet 5   Vitamin D, Ergocalciferol, (DRISDOL) 1.25 MG (50000 UNIT) CAPS capsule Take 1 capsule (50,000 Units total) by mouth every 7 (seven) days. 8 capsule 0   fluticasone (FLONASE) 50 MCG/ACT nasal spray Place 2 sprays into both nostrils daily. 18 mL 0   ipratropium (ATROVENT) 0.06 % nasal spray Place 2 sprays into both nostrils 4 (four) times daily. As needed for nasal congestion, runny nose 15 mL 0   No facility-administered medications prior to visit.    PAST MEDICAL HISTORY: Past Medical History:  Diagnosis Date   Allergy    Victoza   Anemia    Asthma    Genital herpes    GERD (gastroesophageal reflux disease)    Headache    03/25/22- has not had in a while   Other fatigue    PCOS (polycystic ovarian syndrome)    Pre-diabetes    Prediabetes    Shortness of breath on exertion    Sleep apnea     PAST SURGICAL HISTORY: Past Surgical History:  Procedure Laterality Date   BREAST CYST EXCISION Bilateral 03/27/2022   Procedure: EXCISION OF BILATERAL BREAST CHRONIC ABSCESS;  Surgeon: Almond Lint, MD;  Location: [facility] OR;  Service: General;  Laterality: Bilateral;   TONSILLECTOMY      FAMILY HISTORY: Family History  Problem Relation Age  of Onset   Hypertension Mother    Thyroid disease Mother    Sleep apnea Mother    Obesity Mother    Migraines Mother    Obesity Father    Sleep apnea Maternal Grandmother    Asthma Maternal Grandmother    Cancer Maternal Grandmother    Asthma Paternal Grandmother    Diabetes Paternal Grandmother     SOCIAL HISTORY: Social History   Socioeconomic History   Marital status: Single    Spouse name: Not on file   Number of children: Not on file   Years of education: Not on file   Highest education level: Bachelor's degree (e.g., BA, AB, BS)  Occupational History   Occupation: Public relations account executive  Tobacco Use   Smoking status: Never   Smokeless tobacco: Never  Vaping Use   Vaping status: Never Used  Substance and Sexual Activity   Alcohol use: Yes    Comment: occasionally   Drug use: Not Currently    Types: Marijuana   Sexual activity: Yes    Birth control/protection: None  Other Topics Concern   Not on file  Social History Narrative   Not on file   Social Drivers of Health   Financial Resource Strain: Low Risk  (04/02/2023)   Overall Financial Resource Strain (CARDIA)    Difficulty of Paying Living Expenses: Not very hard  Food Insecurity: No Food Insecurity (04/02/2023)   Hunger Vital Sign    Worried About Running Out of Food in the Last Year: Never true    Ran Out of Food in the Last Year: Never true  Transportation Needs: No Transportation Needs (04/02/2023)   PRAPARE - Administrator, Civil Service (Medical): No    Lack of Transportation (Non-Medical): No  Physical Activity: Insufficiently Active (04/02/2023)   Exercise Vital Sign    Days of Exercise per Week: 2 days    Minutes of Exercise per Session: 30 min  Stress: No Stress Concern Present (04/02/2023)   [employer] of Occupational Health - Occupational Stress Questionnaire    Feeling of Stress : Only a little  Social Connections: Moderately Isolated (04/02/2023)  Social Oncologist [NHANES]    Frequency of Communication with Friends and Family: More than three times a week    Frequency of Social Gatherings with Friends and Family: Once a week    Attends Religious Services: More than 4 times per year    Active Member of [employer] or Organizations: No    Attends Engineer, structural: Not on file    Marital Status: Never married  Intimate Partner Violence: Unknown (05/16/2021)   Received from [employer], [facility]   HITS    Physically Hurt: Not on file    Insult or Talk Down To: Not on file    Threaten Physical Harm: Not on file    Scream or Curse: Not on file   PHYSICAL EXAM  Vitals:   05/05/23 0831  BP: 108/71  Pulse: 72  Weight: 237 lb (107.5 kg)  Height: 5\' 2"  (1.575 m)   Body mass index is 43.35 kg/m.  Generalized: Well developed, in no acute distress  Neurological examination  Mentation: Alert oriented to time, place, history taking. Follows all commands speech and language fluent Cranial nerve II-XII: Pupils were equal round reactive to light. Extraocular movements were full, visual field were full on confrontational test. Facial sensation and strength were normal.  Head turning and shoulder shrug  were normal and symmetric. Motor: The motor testing reveals 5 over 5 strength of all 4 extremities. Good symmetric motor tone is noted throughout.  Sensory: Sensory testing is intact to soft touch on all 4 extremities. No evidence of extinction is noted.  Coordination: Cerebellar testing reveals good finger-nose-finger and heel-to-shin bilaterally.  Gait and station: Gait is normal.  Reflexes: Deep tendon reflexes are symmetric and normal bilaterally.   DIAGNOSTIC DATA (LABS, IMAGING, TESTING) - I reviewed patient records, labs, notes, testing and imaging myself where available.  Lab Results  Component Value Date   WBC 5.4 04/03/2023   HGB 12.5 04/03/2023   HCT 39.1 04/03/2023   MCV 84.9 04/03/2023   PLT 230.0 04/03/2023       Component Value Date/Time   NA 137 04/03/2023 0935   NA 142 06/12/2021 0914   K 3.6 04/03/2023 0935   CL 104 04/03/2023 0935   CO2 24 04/03/2023 0935   GLUCOSE 87 04/03/2023 0935   BUN 11 04/03/2023 0935   BUN 8 06/12/2021 0914   CREATININE 1.01 04/03/2023 0935   CALCIUM 9.2 04/03/2023 0935   PROT 8.1 04/03/2023 0935   PROT 6.9 06/12/2021 0914   ALBUMIN 4.1 04/03/2023 0935   ALBUMIN 3.5 (L) 06/12/2021 0914   AST 14 04/03/2023 0935   ALT 8 04/03/2023 0935   ALKPHOS 51 04/03/2023 0935   BILITOT 0.4 04/03/2023 0935   BILITOT <0.2 06/12/2021 0914   GFRNONAA 107 03/06/2020 1012   GFRAA 124 03/06/2020 1012   Lab Results  Component Value Date   CHOL 148 04/03/2023   HDL 40.50 04/03/2023   LDLCALC 77 04/03/2023   TRIG 152.0 (H) 04/03/2023   CHOLHDL 4 04/03/2023   Lab Results  Component Value Date   HGBA1C 5.8 04/03/2023   Lab Results  Component Value Date   VITAMINB12 262 03/06/2020   Lab Results  Component Value Date   TSH 0.84 04/03/2023    Margie Ege, AGNP-C, DNP 05/05/2023, 8:45 AM [facility] [address], [address] [place], [place] [address] [phone]

## 2023-05-05 ENCOUNTER — Encounter: Payer: Self-pay | Admitting: Neurology

## 2023-05-05 ENCOUNTER — Ambulatory Visit (INDEPENDENT_AMBULATORY_CARE_PROVIDER_SITE_OTHER): Payer: BC Managed Care – PPO | Admitting: Neurology

## 2023-05-05 VITALS — BP 108/71 | HR 72 | Ht 62.0 in | Wt 237.0 lb

## 2023-05-05 DIAGNOSIS — G4733 Obstructive sleep apnea (adult) (pediatric): Secondary | ICD-10-CM

## 2023-05-05 DIAGNOSIS — G43709 Chronic migraine without aura, not intractable, without status migrainosus: Secondary | ICD-10-CM

## 2023-05-05 MED ORDER — TOPIRAMATE 50 MG PO TABS: 50.0000 mg | ORAL_TABLET | Freq: Every day | ORAL | 5 refills | Status: DC

## 2023-05-05 MED ORDER — FOLIC ACID 1 MG PO TABS: 1.0000 mg | ORAL_TABLET | Freq: Every day | ORAL | 5 refills | Status: DC

## 2023-05-05 MED ORDER — TOPIRAMATE 100 MG PO TABS: 100.0000 mg | ORAL_TABLET | Freq: Every day | ORAL | 5 refills | Status: DC

## 2023-05-05 NOTE — Patient Instructions (Addendum)
 Continue the Topamax for migraine prevention, increase to 100 mg at bedtime. Recommend against pregnancy as it can cause teratogenicity. Start Folic acid 1 mg daily. Use Maxalt for severe headache. Continue CPAP nightly for minimum 4 hours. Follow up in 6 months

## 2023-05-11 DIAGNOSIS — F331 Major depressive disorder, recurrent, moderate: Secondary | ICD-10-CM | POA: Diagnosis not present

## 2023-05-11 DIAGNOSIS — F4312 Post-traumatic stress disorder, chronic: Secondary | ICD-10-CM | POA: Diagnosis not present

## 2023-05-14 ENCOUNTER — Encounter: Payer: Self-pay | Admitting: Family Medicine

## 2023-05-14 DIAGNOSIS — J4530 Mild persistent asthma, uncomplicated: Secondary | ICD-10-CM

## 2023-05-14 DIAGNOSIS — J302 Other seasonal allergic rhinitis: Secondary | ICD-10-CM

## 2023-05-14 DIAGNOSIS — E282 Polycystic ovarian syndrome: Secondary | ICD-10-CM

## 2023-05-14 DIAGNOSIS — N926 Irregular menstruation, unspecified: Secondary | ICD-10-CM

## 2023-05-15 MED ORDER — CETIRIZINE HCL 10 MG PO TABS: 10.0000 mg | ORAL_TABLET | Freq: Every day | ORAL | 11 refills | Status: DC

## 2023-05-15 MED ORDER — ALBUTEROL SULFATE HFA 108 (90 BASE) MCG/ACT IN AERS: 2.0000 | INHALATION_SPRAY | Freq: Four times a day (QID) | RESPIRATORY_TRACT | 0 refills | Status: AC | PRN

## 2023-05-18 DIAGNOSIS — F331 Major depressive disorder, recurrent, moderate: Secondary | ICD-10-CM | POA: Diagnosis not present

## 2023-05-18 DIAGNOSIS — F4312 Post-traumatic stress disorder, chronic: Secondary | ICD-10-CM | POA: Diagnosis not present

## 2023-05-18 DIAGNOSIS — R109 Unspecified abdominal pain: Secondary | ICD-10-CM | POA: Diagnosis not present

## 2023-05-18 DIAGNOSIS — K5900 Constipation, unspecified: Secondary | ICD-10-CM | POA: Diagnosis not present

## 2023-05-18 DIAGNOSIS — R110 Nausea: Secondary | ICD-10-CM | POA: Diagnosis not present

## 2023-05-20 NOTE — Progress Notes (Unsigned)
   Acute Office Visit  Subjective:     Patient ID: Peggy Garza, female    DOB: April 14, 2000, 23 y.o.   MRN: [id number]  No chief complaint on file.   HPI Patient is in today for ***  ROS Per HPI      Objective:    There were no vitals taken for this visit.   Physical Exam Vitals and nursing note reviewed.  Constitutional:      General: She is not in acute distress.    Appearance: Normal appearance. She is normal weight.  HENT:     Head: Normocephalic and atraumatic.     Right Ear: External ear normal.     Left Ear: External ear normal.     Nose: Nose normal.     Mouth/Throat:     Mouth: Mucous membranes are moist.     Pharynx: Oropharynx is clear.  Eyes:     Extraocular Movements: Extraocular movements intact.     Pupils: Pupils are equal, round, and reactive to light.  Cardiovascular:     Rate and Rhythm: Normal rate and regular rhythm.     Pulses: Normal pulses.     Heart sounds: Normal heart sounds.  Pulmonary:     Effort: Pulmonary effort is normal. No respiratory distress.     Breath sounds: Normal breath sounds. No wheezing, rhonchi or rales.  Musculoskeletal:        General: Normal range of motion.     Cervical back: Normal range of motion.     Right lower leg: Edema present.     Left lower leg: Edema present.  Lymphadenopathy:     Cervical: No cervical adenopathy.  Neurological:     General: No focal deficit present.     Mental Status: She is alert and oriented to person, place, and time.  Psychiatric:        Mood and Affect: Mood normal.        Thought Content: Thought content normal.   No results found for any visits on 05/22/23.      Assessment & Plan:   Edema, unspecified type     No orders of the defined types were placed in this encounter.   No follow-ups on file.  Sherald Barge, FNP

## 2023-05-22 ENCOUNTER — Encounter: Payer: Self-pay | Admitting: Family Medicine

## 2023-05-22 ENCOUNTER — Ambulatory Visit (INDEPENDENT_AMBULATORY_CARE_PROVIDER_SITE_OTHER): Admitting: Family Medicine

## 2023-05-22 ENCOUNTER — Other Ambulatory Visit: Payer: Self-pay | Admitting: Family Medicine

## 2023-05-22 VITALS — BP 132/80 | HR 64 | Temp 98.1°F | Ht 62.0 in | Wt 232.2 lb

## 2023-05-22 DIAGNOSIS — R609 Edema, unspecified: Secondary | ICD-10-CM | POA: Diagnosis not present

## 2023-05-22 DIAGNOSIS — Z79899 Other long term (current) drug therapy: Secondary | ICD-10-CM

## 2023-05-22 DIAGNOSIS — Z6841 Body Mass Index (BMI) 40.0 and over, adult: Secondary | ICD-10-CM

## 2023-05-22 LAB — COMPREHENSIVE METABOLIC PANEL WITH GFR
ALT: 14 U/L (ref 0–35)
AST: 18 U/L (ref 0–37)
Albumin: 4.2 g/dL (ref 3.5–5.2)
Alkaline Phosphatase: 56 U/L (ref 39–117)
BUN: 8 mg/dL (ref 6–23)
CO2: 25 meq/L (ref 19–32)
Calcium: 9.3 mg/dL (ref 8.4–10.5)
Chloride: 109 meq/L (ref 96–112)
Creatinine, Ser: 0.97 mg/dL (ref 0.40–1.20)
GFR: 81.61 mL/min (ref >60.00)
Glucose, Bld: 90 mg/dL (ref 70–99)
Potassium: 3.6 meq/L (ref 3.5–5.1)
Sodium: 140 meq/L (ref 135–145)
Total Bilirubin: 0.4 mg/dL (ref 0.2–1.2)
Total Protein: 7.7 g/dL (ref 6.0–8.3)

## 2023-05-22 LAB — CBC WITH DIFFERENTIAL/PLATELET
Basophils Absolute: 0 10*3/uL (ref 0.0–0.1)
Basophils Relative: 0.3 % (ref 0.0–3.0)
Eosinophils Absolute: 0.2 10*3/uL (ref 0.0–0.7)
Eosinophils Relative: 2.7 % (ref 0.0–5.0)
HCT: 38 % (ref 36.0–46.0)
Hemoglobin: 12.3 g/dL (ref 12.0–15.0)
Lymphocytes Relative: 52.1 % — ABNORMAL HIGH (ref 12.0–46.0)
Lymphs Abs: 3.2 10*3/uL (ref 0.7–4.0)
MCHC: 32.5 g/dL (ref 30.0–36.0)
MCV: 84.9 fl (ref 78.0–100.0)
Monocytes Absolute: 0.5 10*3/uL (ref 0.1–1.0)
Monocytes Relative: 7.8 % (ref 3.0–12.0)
Neutro Abs: 2.3 10*3/uL (ref 1.4–7.7)
Neutrophils Relative %: 37.1 % — ABNORMAL LOW (ref 43.0–77.0)
Platelets: 237 10*3/uL (ref 150.0–400.0)
RBC: 4.48 Mil/uL (ref 3.87–5.11)
RDW: 15.6 % — ABNORMAL HIGH (ref 11.5–15.5)
WBC: 6.1 10*3/uL (ref 4.0–10.5)

## 2023-05-22 LAB — BRAIN NATRIURETIC PEPTIDE: Pro B Natriuretic peptide (BNP): 18 pg/mL (ref 0.0–100.0)

## 2023-05-22 MED ORDER — HYDROCHLOROTHIAZIDE 12.5 MG PO CAPS: 12.5000 mg | ORAL_CAPSULE | Freq: Every day | ORAL | 1 refills | Status: DC

## 2023-05-22 NOTE — Patient Instructions (Signed)
 I have sent in HCTZ 12.5 mg for you to take once daily in the mornings to help with swelling.  This medication may also decrease your blood pressure just a bit, we have a little room for that.  If you start to notice any muscle cramps, dizziness, lightheadedness, palpitations, other concerning symptoms, please stop the medication and let me know.  Wearing compression socks will help the swelling as well even if you are sitting all day.  Follow-up with me in about a month for recheck, otherwise as scheduled

## 2023-05-22 NOTE — Assessment & Plan Note (Signed)
 Continue efforts in healthy diet and activity level You lost 10 pounds since her last visit, keep it up

## 2023-05-22 NOTE — Assessment & Plan Note (Signed)
 Labs today, starting HCTZ for swelling

## 2023-05-22 NOTE — Assessment & Plan Note (Signed)
 CBC, CMP, BNP today Start HCTZ once daily for swelling Discussed use of compression socks Discussed low-salt diet

## 2023-05-25 DIAGNOSIS — F4312 Post-traumatic stress disorder, chronic: Secondary | ICD-10-CM | POA: Diagnosis not present

## 2023-05-25 DIAGNOSIS — F331 Major depressive disorder, recurrent, moderate: Secondary | ICD-10-CM | POA: Diagnosis not present

## 2023-06-01 DIAGNOSIS — F331 Major depressive disorder, recurrent, moderate: Secondary | ICD-10-CM | POA: Diagnosis not present

## 2023-06-01 DIAGNOSIS — F4312 Post-traumatic stress disorder, chronic: Secondary | ICD-10-CM | POA: Diagnosis not present

## 2023-06-09 DIAGNOSIS — F4312 Post-traumatic stress disorder, chronic: Secondary | ICD-10-CM | POA: Diagnosis not present

## 2023-06-09 DIAGNOSIS — F331 Major depressive disorder, recurrent, moderate: Secondary | ICD-10-CM | POA: Diagnosis not present

## 2023-06-15 DIAGNOSIS — F4312 Post-traumatic stress disorder, chronic: Secondary | ICD-10-CM | POA: Diagnosis not present

## 2023-06-15 DIAGNOSIS — F331 Major depressive disorder, recurrent, moderate: Secondary | ICD-10-CM | POA: Diagnosis not present

## 2023-06-17 DIAGNOSIS — R300 Dysuria: Secondary | ICD-10-CM | POA: Diagnosis not present

## 2023-06-17 DIAGNOSIS — B3731 Acute candidiasis of vulva and vagina: Secondary | ICD-10-CM | POA: Diagnosis not present

## 2023-06-17 DIAGNOSIS — A5901 Trichomonal vulvovaginitis: Secondary | ICD-10-CM | POA: Diagnosis not present

## 2023-06-17 DIAGNOSIS — Z113 Encounter for screening for infections with a predominantly sexual mode of transmission: Secondary | ICD-10-CM | POA: Diagnosis not present

## 2023-06-18 DIAGNOSIS — Z202 Contact with and (suspected) exposure to infections with a predominantly sexual mode of transmission: Secondary | ICD-10-CM | POA: Diagnosis not present

## 2023-06-18 DIAGNOSIS — N898 Other specified noninflammatory disorders of vagina: Secondary | ICD-10-CM | POA: Diagnosis not present

## 2023-06-22 ENCOUNTER — Ambulatory Visit (INDEPENDENT_AMBULATORY_CARE_PROVIDER_SITE_OTHER): Admitting: Family Medicine

## 2023-06-22 ENCOUNTER — Encounter: Payer: Self-pay | Admitting: Family Medicine

## 2023-06-22 VITALS — BP 120/76 | HR 57 | Temp 97.5°F | Ht 62.0 in | Wt 232.4 lb

## 2023-06-22 DIAGNOSIS — N760 Acute vaginitis: Secondary | ICD-10-CM | POA: Diagnosis not present

## 2023-06-22 DIAGNOSIS — F331 Major depressive disorder, recurrent, moderate: Secondary | ICD-10-CM | POA: Diagnosis not present

## 2023-06-22 DIAGNOSIS — F4312 Post-traumatic stress disorder, chronic: Secondary | ICD-10-CM | POA: Diagnosis not present

## 2023-06-22 MED ORDER — FLUCONAZOLE 150 MG PO TABS: 150.0000 mg | ORAL_TABLET | Freq: Once | ORAL | 0 refills | Status: AC

## 2023-06-22 NOTE — Assessment & Plan Note (Signed)
 Finish metronidazole  course Given recent abx and clinical appearance of vagina, will go ahead and treat with fluconazole .

## 2023-06-22 NOTE — Progress Notes (Signed)
   Acute Office Visit  Subjective:     Patient ID: Peggy Garza, female    DOB: 08/19/2000, 23 y.o.   MRN: [id number]  No chief complaint on file.   HPI 23 year old female presents for evaluation of acute vaginitis. Reports that she is on day 5 of metronidazole  for BV. Reports that she is now experiencing some burning and itching as well as a white discharge. Was recently treated for trichomoniasis as well. Is concerned that given the level of discomfort, that there may be a condom trapped within the vagina, was unable to find it after last sexual encounter about 3 days ago. Denies abdominal pain, nausea, vomiting or diarrhea crash, fever, chills, other symptoms. Medical history as outlined below.    ROS Per HPI      Objective:    BP 120/76 (BP Location: Left Arm, Patient Position: Sitting)   Pulse (!) 57   Temp (!) 97.5 F (36.4 C) (Temporal)   Ht 5\' 2"  (1.575 m)   Wt 232 lb 6.4 oz (105.4 kg)   SpO2 100%   BMI 42.51 kg/m    Physical Exam Vitals and nursing note reviewed. Exam conducted with a chaperone present Eulis Hicks, CM A).  Constitutional:      General: She is not in acute distress.    Appearance: Normal appearance. She is obese.  HENT:     Head: Normocephalic and atraumatic.     Right Ear: External ear normal.     Left Ear: External ear normal.  Eyes:     Extraocular Movements: Extraocular movements intact.  Cardiovascular:     Rate and Rhythm: Normal rate.  Pulmonary:     Effort: Pulmonary effort is normal.  Genitourinary:    General: Normal vulva.     Vagina: Vaginal discharge present.     Comments: Mildly erythematous shiny skin surrounding inner labia. Internal exam shows retroverted uterus, with white vaginal discharge, mildly friable cervix. No cervical discharge, no foreign body Musculoskeletal:        General: Normal range of motion.     Cervical back: Normal range of motion.     Right lower leg: No edema.     Left lower leg: No  edema.  Lymphadenopathy:     Cervical: No cervical adenopathy.  Neurological:     General: No focal deficit present.     Mental Status: She is alert and oriented to person, place, and time.  Psychiatric:        Mood and Affect: Mood normal.        Thought Content: Thought content normal.     No results found for any visits on 06/22/23.      Assessment & Plan:   Acute vaginitis Assessment & Plan: Finish metronidazole  course Given recent abx and clinical appearance of vagina, will go ahead and treat with fluconazole .     Other orders -     Fluconazole ; Take 1 tablet (150 mg total) by mouth once for 1 dose.  Dispense: 1 tablet; Refill: 0     Meds ordered this encounter  Medications   fluconazole  (DIFLUCAN ) 150 MG tablet    Sig: Take 1 tablet (150 mg total) by mouth once for 1 dose.    Dispense:  1 tablet    Refill:  0    Return if symptoms worsen or fail to improve.  Wellington Half, FNP

## 2023-06-22 NOTE — Patient Instructions (Signed)
 I have sent in a refill of fluconazole  for you to take one tablet on Friday to help complete your course of antifungals.   Finish out your metronidazole  as prescribed.  Follow-up with me for new or worsening symptoms.

## 2023-06-29 ENCOUNTER — Encounter: Payer: Self-pay | Admitting: Family Medicine

## 2023-06-29 DIAGNOSIS — Z113 Encounter for screening for infections with a predominantly sexual mode of transmission: Secondary | ICD-10-CM | POA: Diagnosis not present

## 2023-06-29 DIAGNOSIS — Z114 Encounter for screening for human immunodeficiency virus [HIV]: Secondary | ICD-10-CM | POA: Diagnosis not present

## 2023-06-29 DIAGNOSIS — F331 Major depressive disorder, recurrent, moderate: Secondary | ICD-10-CM | POA: Diagnosis not present

## 2023-06-29 DIAGNOSIS — F4312 Post-traumatic stress disorder, chronic: Secondary | ICD-10-CM | POA: Diagnosis not present

## 2023-07-16 ENCOUNTER — Ambulatory Visit: Admitting: Obstetrics and Gynecology

## 2023-07-16 ENCOUNTER — Encounter: Payer: Self-pay | Admitting: Family Medicine

## 2023-07-16 ENCOUNTER — Other Ambulatory Visit (HOSPITAL_COMMUNITY)
Admission: RE | Admit: 2023-07-16 | Discharge: 2023-07-16 | Disposition: A | Discharge status: Home / Self Care | Source: Ambulatory Visit | Attending: Obstetrics and Gynecology | Admitting: Obstetrics and Gynecology

## 2023-07-16 ENCOUNTER — Other Ambulatory Visit: Payer: Self-pay

## 2023-07-16 VITALS — BP 111/75 | HR 92 | Wt 234.9 lb

## 2023-07-16 DIAGNOSIS — N914 Secondary oligomenorrhea: Secondary | ICD-10-CM | POA: Diagnosis not present

## 2023-07-16 DIAGNOSIS — E66813 Obesity, class 3: Secondary | ICD-10-CM | POA: Diagnosis not present

## 2023-07-16 DIAGNOSIS — Z124 Encounter for screening for malignant neoplasm of cervix: Secondary | ICD-10-CM | POA: Diagnosis not present

## 2023-07-16 DIAGNOSIS — Z113 Encounter for screening for infections with a predominantly sexual mode of transmission: Secondary | ICD-10-CM | POA: Diagnosis not present

## 2023-07-16 DIAGNOSIS — Z6841 Body Mass Index (BMI) 40.0 and over, adult: Secondary | ICD-10-CM

## 2023-07-16 DIAGNOSIS — N761 Subacute and chronic vaginitis: Secondary | ICD-10-CM | POA: Diagnosis not present

## 2023-07-16 DIAGNOSIS — G4733 Obstructive sleep apnea (adult) (pediatric): Secondary | ICD-10-CM

## 2023-07-16 DIAGNOSIS — Z1331 Encounter for screening for depression: Secondary | ICD-10-CM | POA: Diagnosis not present

## 2023-07-16 MED ORDER — MEDROXYPROGESTERONE ACETATE 10 MG PO TABS: 10.0000 mg | ORAL_TABLET | Freq: Every day | ORAL | 2 refills | Status: AC

## 2023-07-16 MED ORDER — FLUCONAZOLE 150 MG PO TABS: 150.0000 mg | ORAL_TABLET | Freq: Once | ORAL | 0 refills | Status: AC

## 2023-07-16 NOTE — Telephone Encounter (Signed)
 Would you like pt to schedule ov for medication and referral? If so I will contact pt in regards to that

## 2023-07-16 NOTE — Patient Instructions (Signed)
 Provera  to help induce a menses  Pelvic ultrasound to check your uterine lining  Diflucan  for vaginal discharge  STI testing and pap smear done today

## 2023-07-16 NOTE — Progress Notes (Signed)
 NEW GYNECOLOGY PATIENT Patient name: Peggy Garza MRN [id number]  Date of birth: February 18, 2000 Chief Complaint:   Gynecologic Exam     History:  Peggy Garza is a 23 y.o. G0P0000 being seen today for dsicussion of menstrual irregularities. Previously diagnosed with PCOS and wondering what can be done to 'reverse' it as well as regular her menses. Will often skip a menses and when she does have bleeding, it is very light.     Discussed the use of AI scribe software for clinical note transcription with the patient, who gave verbal consent to proceed.  History of Present Illness Peggy Garza is a 23 year old female who presents with menstrual irregularities and associated symptoms.  She has experienced irregular periods since menarche, often skipping up to a year at a time, with her last menstrual period occurring over a year ago. Previously, she used progesterone for the first ten days of the month, which resulted in regular periods for three to four months before irregularity resumed. No other treatments have been used for her menstrual irregularities.  She experiences significant hair growth on her face, which she finds concerning. No new symptoms aside from the irregular periods and hair growth have been reported.  She is currently on Topamax  and Maxalt  for migraines, which do not present with aura. She denies any history of blood clots or uncontrolled high blood pressure.  She has experienced recurrent yeast infections, with symptoms of discharge and itching. Her blood sugar was checked once and was normal, but she has not had consistent monitoring related to these infections.  She has a history of undergoing an HSG test to check for blocked fallopian tubes when trying to conceive, but she is not currently attempting pregnancy. She is sexually active with a female partner and denies any pain with intercourse.     Gynecologic History No LMP recorded. (Menstrual status: Irregular  Periods). Contraception: none Last Pap: none on file  Last Mammogram: n/a Last Colonoscopy: n/a  Obstetric History OB History  Gravida Para Term Preterm AB Living  0 0 0 0 0 0  SAB IAB Ectopic Multiple Live Births  0 0 0 0 0    Past Medical History:  Diagnosis Date   Allergy    Victoza    Anemia    Asthma    Genital herpes    GERD (gastroesophageal reflux disease)    Headache    03/25/22- has not had in a while   Other fatigue    PCOS (polycystic ovarian syndrome)    Pre-diabetes    Prediabetes    Shortness of breath on exertion    Sleep apnea     Past Surgical History:  Procedure Laterality Date   BREAST CYST EXCISION Bilateral 03/27/2022   Procedure: EXCISION OF BILATERAL BREAST CHRONIC ABSCESS;  Surgeon: Lockie Rima, MD;  Location: [facility] OR;  Service: General;  Laterality: Bilateral;   TONSILLECTOMY      Current Outpatient Medications on File Prior to Visit  Medication Sig Dispense Refill   acyclovir (ZOVIRAX) 200 MG/5ML suspension Take 800 mg by mouth 3 (three) times daily as needed (Outbreak).     albuterol  (VENTOLIN  HFA) 108 (90 Base) MCG/ACT inhaler Inhale 2 puffs into the lungs every 6 (six) hours as needed for wheezing or shortness of breath (Cough). 18 g 0   cetirizine  (ZYRTEC ) 10 MG tablet Take 1 tablet (10 mg total) by mouth daily. 30 tablet 11   folic acid  (FOLVITE ) 1 MG tablet Take 1 tablet (1  mg total) by mouth daily. 30 tablet 5   hydrochlorothiazide  (MICROZIDE ) 12.5 MG capsule Take 1 capsule (12.5 mg total) by mouth daily. 30 capsule 1   rizatriptan  (MAXALT ) 10 MG tablet Take 1 tablet (10 mg total) by mouth as needed for migraine. May repeat in 2 hours if needed 10 tablet 2   topiramate  (TOPAMAX ) 100 MG tablet Take 1 tablet (100 mg total) by mouth at bedtime. 30 tablet 5   Vitamin D , Ergocalciferol , (DRISDOL ) 1.25 MG (50000 UNIT) CAPS capsule Take 1 capsule (50,000 Units total) by mouth every 7 (seven) days. 8 capsule 0   No current facility-administered  medications on file prior to visit.    Allergies  Allergen Reactions   Victoza  [Liraglutide ] Other (See Comments)    Patient notes side effects of sore throat and hoarseness, however nurse documentation reports that the patient stated that her throat was closing.  Patient never sought emergency care and never had problems breathing.    Social History:  reports that she has never smoked. She has never used smokeless tobacco. She reports current alcohol use. She reports that she does not currently use drugs after having used the following drugs: Marijuana.  Family History  Problem Relation Age of Onset   Hypertension Mother    Thyroid  disease Mother    Sleep apnea Mother    Obesity Mother    Migraines Mother    Obesity Father    Sleep apnea Maternal Grandmother    Asthma Maternal Grandmother    Cancer Maternal Grandmother    Asthma Paternal Grandmother    Diabetes Paternal Grandmother     The following portions of the patient's history were reviewed and updated as appropriate: allergies, current medications, past family history, past medical history, past social history, past surgical history and problem list.  Review of Systems Pertinent items noted in HPI and remainder of comprehensive ROS otherwise negative.  Physical Exam:  BP 111/75   Pulse 92   Wt 234 lb 14.4 oz (106.5 kg)   BMI 42.96 kg/m  Physical Exam Vitals and nursing note reviewed. Exam conducted with a chaperone present.  Constitutional:      Appearance: Normal appearance.  Pulmonary:     Effort: Pulmonary effort is normal.  Abdominal:     Palpations: Abdomen is soft.  Genitourinary:    General: Normal vulva.     Exam position: Lithotomy position.     Vagina: Normal.     Cervix: Normal.   Neurological:     Mental Status: She is alert.        Assessment and Plan:   Assessment & Plan Irregular Menstrual Cycles Amenorrhea for over a year. Discussed hormonal medications for cycle regulation,  pregnancy prevention, and management of hirsutism and acne. Emphasized prevention of endometrial hyperplasia. Explained lifestyle modifications and potential weight loss surgery benefits. - Start Provera  to induce menstruation. - Switch to daily birth control pill after initial bleed. - Monitor for worsening migraines; switch to progesterone-only medication if migraines worsen. - Order pelvic ultrasound to assess endometrial thickness. - Discuss lifestyle modifications including increased protein intake, strength training, and exercise. - Place referral for weight loss surgery consultation with a general surgeon. - Advise contacting primary care for referral if not visible in a few days.  Hirsutism Bothersome facial hair growth. Discussed hormonal medications for management, with potential need for additional treatments or hair removal.  Obesity Struggles with weight management. Discussed weight loss surgery benefits for weight and menstrual regulation. - Referral for  weight loss surgery consultation placed - Advise contacting primary care for referral if not visible in a few days.  Recurrent Yeast Infections Recurrent infections with discharge and itching. Previous normal blood sugar levels. - Perform swab test to confirm yeast infection. - Requesting repeat A1c to reassess glycemic control as part of work up for recurrence  Well Woman Examination No previous Pap smear.  - Cervical cancer screening: Discussed screening Q3 years. Reviewed importance of annual exams and limits of pap smear. Pap with reflex HPV collected - GC/CT: Discussed and recommended. Pt  accepts - Gardasil: completed - Birth Control: none - Breast Health: Encouraged self breast awareness/exams.  - Follow-up: 12 months and prn   Routine preventative health maintenance measures emphasized. Please refer to After Visit Summary for other counseling recommendations.   Follow-up: No follow-ups on file.       Kiki Pelton, MD Obstetrician & Gynecologist, [facility] [employer], [facility]

## 2023-07-17 ENCOUNTER — Ambulatory Visit: Payer: Self-pay | Admitting: Obstetrics and Gynecology

## 2023-07-17 DIAGNOSIS — B379 Candidiasis, unspecified: Secondary | ICD-10-CM

## 2023-07-17 DIAGNOSIS — N761 Subacute and chronic vaginitis: Secondary | ICD-10-CM

## 2023-07-17 LAB — CERVICOVAGINAL ANCILLARY ONLY
Bacterial Vaginitis (gardnerella): NEGATIVE
Candida Glabrata: NEGATIVE
Candida Vaginitis: POSITIVE — AB
Chlamydia: NEGATIVE
Comment: NEGATIVE
Comment: NEGATIVE
Comment: NEGATIVE
Comment: NEGATIVE
Comment: NEGATIVE
Comment: NORMAL
Neisseria Gonorrhea: NEGATIVE
Trichomonas: NEGATIVE

## 2023-07-17 MED ORDER — FLUCONAZOLE 150 MG PO TABS: 150.0000 mg | ORAL_TABLET | Freq: Once | ORAL | 2 refills | Status: AC

## 2023-07-18 LAB — RPR+HBSAG+HCVAB+...
HIV Screen 4th Generation wRfx: NONREACTIVE
Hep C Virus Ab: NONREACTIVE
Hepatitis B Surface Ag: NEGATIVE
RPR Ser Ql: NONREACTIVE

## 2023-07-18 LAB — TESTOSTERONE,FREE AND TOTAL
Testosterone, Free: 7 pg/mL — ABNORMAL HIGH (ref 0.0–4.2)
Testosterone: 105 ng/dL — ABNORMAL HIGH (ref 13–71)

## 2023-07-18 LAB — HEMOGLOBIN A1C
Est. average glucose Bld gHb Est-mCnc: 120 mg/dL
Hgb A1c MFr Bld: 5.8 % — ABNORMAL HIGH (ref 4.8–5.6)

## 2023-07-20 DIAGNOSIS — F331 Major depressive disorder, recurrent, moderate: Secondary | ICD-10-CM | POA: Diagnosis not present

## 2023-07-20 DIAGNOSIS — F4312 Post-traumatic stress disorder, chronic: Secondary | ICD-10-CM | POA: Diagnosis not present

## 2023-07-20 LAB — CYTOLOGY - PAP
Comment: NEGATIVE
Diagnosis: UNDETERMINED — AB
High risk HPV: NEGATIVE

## 2023-07-20 NOTE — Progress Notes (Unsigned)
   Established Patient Office Visit  Subjective   Patient ID: Peggy Garza, female    DOB: April 04, 2000  Age: 23 y.o. MRN: [id number]  No chief complaint on file.   HPI Patient presents today for medication management. Reports compliance with medication regimen. Denies the need for refills. Not fasting today. Denies other concerns. Medical history as outlined below.  ROS Per HPI    Objective:     There were no vitals taken for this visit.  Physical Exam   No results found for any visits on 07/21/23.   The ASCVD Risk score (Arnett DK, et al., 2019) failed to calculate for the following reasons:   The 2019 ASCVD risk score is only valid for ages 56 to 91    Assessment & Plan:   OSA (obstructive sleep apnea)  Body mass index (BMI) 40.0-44.9, adult (HCC)     No follow-ups on file.    Wellington Half, FNP

## 2023-07-21 ENCOUNTER — Encounter: Payer: Self-pay | Admitting: Family Medicine

## 2023-07-21 ENCOUNTER — Ambulatory Visit (INDEPENDENT_AMBULATORY_CARE_PROVIDER_SITE_OTHER): Admitting: Family Medicine

## 2023-07-21 VITALS — BP 112/80 | HR 66 | Temp 98.3°F | Ht 62.0 in | Wt 233.0 lb

## 2023-07-21 DIAGNOSIS — Z6841 Body Mass Index (BMI) 40.0 and over, adult: Secondary | ICD-10-CM | POA: Diagnosis not present

## 2023-07-21 DIAGNOSIS — G4733 Obstructive sleep apnea (adult) (pediatric): Secondary | ICD-10-CM

## 2023-07-21 NOTE — Patient Instructions (Signed)
 I have sent in a referral to [facility] today. Someone should be calling to get you scheduled for surgical consult.  Follow-up with me for new or worsening symptoms.

## 2023-08-03 DIAGNOSIS — F4312 Post-traumatic stress disorder, chronic: Secondary | ICD-10-CM | POA: Diagnosis not present

## 2023-08-03 DIAGNOSIS — F331 Major depressive disorder, recurrent, moderate: Secondary | ICD-10-CM | POA: Diagnosis not present

## 2023-08-10 DIAGNOSIS — R300 Dysuria: Secondary | ICD-10-CM | POA: Diagnosis not present

## 2023-08-10 DIAGNOSIS — B3731 Acute candidiasis of vulva and vagina: Secondary | ICD-10-CM | POA: Diagnosis not present

## 2023-08-10 DIAGNOSIS — R8290 Unspecified abnormal findings in urine: Secondary | ICD-10-CM | POA: Diagnosis not present

## 2023-08-10 DIAGNOSIS — N898 Other specified noninflammatory disorders of vagina: Secondary | ICD-10-CM | POA: Diagnosis not present

## 2023-08-12 DIAGNOSIS — F4312 Post-traumatic stress disorder, chronic: Secondary | ICD-10-CM | POA: Diagnosis not present

## 2023-08-12 DIAGNOSIS — F331 Major depressive disorder, recurrent, moderate: Secondary | ICD-10-CM | POA: Diagnosis not present

## 2023-08-17 DIAGNOSIS — F4312 Post-traumatic stress disorder, chronic: Secondary | ICD-10-CM | POA: Diagnosis not present

## 2023-08-17 DIAGNOSIS — F331 Major depressive disorder, recurrent, moderate: Secondary | ICD-10-CM | POA: Diagnosis not present

## 2023-08-18 ENCOUNTER — Encounter: Payer: Self-pay | Admitting: Obstetrics and Gynecology

## 2023-08-18 DIAGNOSIS — N914 Secondary oligomenorrhea: Secondary | ICD-10-CM

## 2023-08-18 DIAGNOSIS — E282 Polycystic ovarian syndrome: Secondary | ICD-10-CM

## 2023-08-19 MED ORDER — ESTRADIOL 1 MG PO TABS
1.0000 mg | ORAL_TABLET | Freq: Every day | ORAL | 2 refills | Status: AC
Start: 2023-08-19 — End: ?

## 2023-08-20 ENCOUNTER — Telehealth: Payer: Self-pay | Admitting: Lactation Services

## 2023-08-20 NOTE — Telephone Encounter (Signed)
 Called and scheduled US . Scheduled for 07/17 at 3 pm at [facility]. Arrive at 2:45 with full bladder. Lab visit scheduled for the same day. Patient notified via Mychart.

## 2023-08-24 DIAGNOSIS — F4312 Post-traumatic stress disorder, chronic: Secondary | ICD-10-CM | POA: Diagnosis not present

## 2023-08-24 DIAGNOSIS — F331 Major depressive disorder, recurrent, moderate: Secondary | ICD-10-CM | POA: Diagnosis not present

## 2023-08-26 ENCOUNTER — Telehealth: Payer: BC Managed Care – PPO | Admitting: Neurology

## 2023-08-27 ENCOUNTER — Other Ambulatory Visit: Payer: Self-pay

## 2023-08-27 ENCOUNTER — Other Ambulatory Visit

## 2023-08-27 ENCOUNTER — Ambulatory Visit (HOSPITAL_COMMUNITY)
Admission: RE | Admit: 2023-08-27 | Discharge: 2023-08-27 | Disposition: A | Discharge status: Home / Self Care | Source: Ambulatory Visit | Attending: Obstetrics and Gynecology | Admitting: Obstetrics and Gynecology

## 2023-08-27 DIAGNOSIS — N914 Secondary oligomenorrhea: Secondary | ICD-10-CM | POA: Diagnosis not present

## 2023-08-27 DIAGNOSIS — E282 Polycystic ovarian syndrome: Secondary | ICD-10-CM

## 2023-08-27 DIAGNOSIS — N915 Oligomenorrhea, unspecified: Secondary | ICD-10-CM | POA: Diagnosis not present

## 2023-08-28 ENCOUNTER — Ambulatory Visit: Payer: Self-pay | Admitting: Obstetrics and Gynecology

## 2023-08-28 LAB — PROLACTIN: Prolactin: 12.3 ng/mL (ref 4.8–33.4)

## 2023-08-28 LAB — ESTRADIOL: Estradiol: 62.6 pg/mL

## 2023-08-28 LAB — FOLLICLE STIMULATING HORMONE: FSH: 5.9 m[IU]/mL

## 2023-08-31 DIAGNOSIS — F4312 Post-traumatic stress disorder, chronic: Secondary | ICD-10-CM | POA: Diagnosis not present

## 2023-08-31 DIAGNOSIS — F331 Major depressive disorder, recurrent, moderate: Secondary | ICD-10-CM | POA: Diagnosis not present

## 2023-09-03 ENCOUNTER — Ambulatory Visit: Payer: Self-pay | Admitting: Family Medicine

## 2023-09-03 ENCOUNTER — Encounter: Payer: Self-pay | Admitting: Family Medicine

## 2023-09-03 ENCOUNTER — Ambulatory Visit (INDEPENDENT_AMBULATORY_CARE_PROVIDER_SITE_OTHER): Admitting: Family Medicine

## 2023-09-03 VITALS — BP 110/68 | HR 63 | Temp 97.7°F | Ht 62.0 in | Wt 230.8 lb

## 2023-09-03 DIAGNOSIS — G4733 Obstructive sleep apnea (adult) (pediatric): Secondary | ICD-10-CM

## 2023-09-03 DIAGNOSIS — F330 Major depressive disorder, recurrent, mild: Secondary | ICD-10-CM | POA: Diagnosis not present

## 2023-09-03 DIAGNOSIS — Z79899 Other long term (current) drug therapy: Secondary | ICD-10-CM

## 2023-09-03 DIAGNOSIS — Z113 Encounter for screening for infections with a predominantly sexual mode of transmission: Secondary | ICD-10-CM | POA: Diagnosis not present

## 2023-09-03 DIAGNOSIS — Z6841 Body Mass Index (BMI) 40.0 and over, adult: Secondary | ICD-10-CM

## 2023-09-03 DIAGNOSIS — E282 Polycystic ovarian syndrome: Secondary | ICD-10-CM

## 2023-09-03 DIAGNOSIS — M25511 Pain in right shoulder: Secondary | ICD-10-CM

## 2023-09-03 DIAGNOSIS — E559 Vitamin D deficiency, unspecified: Secondary | ICD-10-CM

## 2023-09-03 DIAGNOSIS — B9689 Other specified bacterial agents as the cause of diseases classified elsewhere: Secondary | ICD-10-CM

## 2023-09-03 DIAGNOSIS — B3731 Acute candidiasis of vulva and vagina: Secondary | ICD-10-CM

## 2023-09-03 LAB — COMPREHENSIVE METABOLIC PANEL WITH GFR
ALT: 10 U/L (ref 0–35)
AST: 13 U/L (ref 0–37)
Albumin: 4 g/dL (ref 3.5–5.2)
Alkaline Phosphatase: 52 U/L (ref 39–117)
BUN: 9 mg/dL (ref 6–23)
CO2: 23 meq/L (ref 19–32)
Calcium: 9.2 mg/dL (ref 8.4–10.5)
Chloride: 110 meq/L (ref 96–112)
Creatinine, Ser: 0.93 mg/dL (ref 0.40–1.20)
GFR: 85.67 mL/min (ref >60.00)
Glucose, Bld: 84 mg/dL (ref 70–99)
Potassium: 3.9 meq/L (ref 3.5–5.1)
Sodium: 139 meq/L (ref 135–145)
Total Bilirubin: 0.2 mg/dL (ref 0.2–1.2)
Total Protein: 7.9 g/dL (ref 6.0–8.3)

## 2023-09-03 MED ORDER — VITAMIN D3 50 MCG (2000 UT) PO CAPS: 2000.0000 [IU] | ORAL_CAPSULE | Freq: Every day | ORAL | 1 refills | Status: DC

## 2023-09-03 MED ORDER — SEMAGLUTIDE-WEIGHT MANAGEMENT 0.25 MG/0.5ML ~~LOC~~ SOAJ: 0.2500 mg | SUBCUTANEOUS | 0 refills | Status: DC

## 2023-09-03 NOTE — Patient Instructions (Addendum)
 We are checking labs today, will be in contact with any results that require further attention  I have sent a referral to sports medicine for you today.  Someone will be reaching out to get you scheduled.  May take ibuprofen or Tylenol  for pain as needed  Follow up with me in about 3 months for labs and medication management, sooner if needed.

## 2023-09-03 NOTE — Progress Notes (Signed)
 Acute Office Visit  Subjective:     Patient ID: Peggy Garza, female    DOB: [birth year], 23 y.o.   MRN: [id number]  Chief Complaint  Patient presents with   Shoulder Pain    Ongoing for about 2 weeks, right side. Does not radiate down the arm, has not tried anything for it   Weight Loss    Discuss starting on GLP-1 as opposed to having weight loss surgery.    HPI  Discussed the use of AI scribe software for clinical note transcription with the patient, who gave verbal consent to proceed.  History of Present Illness [name] is a 23 year old female who presents with right shoulder pain and for STD screening.  Right shoulder pain - Duration of two weeks - Localized to the anterior right shoulder - Exacerbated by arm movement, including washing, lifting, or reaching behind her back - No history of trauma or injury - No use of medication for pain relief - No radiation of pain to the neck  Sexually transmitted disease screening - Requests a full STD screening panel  Obstructive sleep apnea - Managed with a CPAP machine  Obesity and weight management - Weight is 230 pounds - Has trialed various GLP-1 medications for weight management - Allergic reaction to Victoza  - Has not taken semaglutide  due to availability  Vitamin d  deficiency - History of vitamin D  deficiency - Requests prescription for vitamin D  supplementation     ROS Per HPI      Objective:    BP 110/68 (BP Location: Left Arm, Patient Position: Sitting)   Pulse 63   Temp 97.7 F (36.5 C) (Temporal)   Ht 5' 2 (1.575 m)   Wt 230 lb 12.8 oz (104.7 kg)   SpO2 98%   BMI 42.21 kg/m    Physical Exam Vitals and nursing note reviewed.  Constitutional:      General: She is not in acute distress.    Appearance: Normal appearance. She is obese.  HENT:     Head: Normocephalic and atraumatic.     Right Ear: External ear normal.     Left Ear: External ear normal.     Nose: Nose normal.      Mouth/Throat:     Mouth: Mucous membranes are moist.     Pharynx: Oropharynx is clear.  Eyes:     Extraocular Movements: Extraocular movements intact.     Pupils: Pupils are equal, round, and reactive to light.  Cardiovascular:     Rate and Rhythm: Normal rate and regular rhythm.     Pulses: Normal pulses.     Heart sounds: Normal heart sounds.  Pulmonary:     Effort: Pulmonary effort is normal. No respiratory distress.     Breath sounds: Normal breath sounds. No wheezing, rhonchi or rales.  Genitourinary:    Comments: Deferred  Musculoskeletal:     Cervical back: Normal range of motion.     Right lower leg: No edema.     Left lower leg: No edema.     Comments: R shoulder with LROM, mild tenderness to anterior aspect.  No erythema, heat, bruising, swelling, deformity  Lymphadenopathy:     Cervical: No cervical adenopathy.  Neurological:     General: No focal deficit present.     Mental Status: She is alert and oriented to person, place, and time.  Psychiatric:        Mood and Affect: Mood normal.        Thought  Content: Thought content normal.     Results for orders placed or performed in visit on 09/03/23  Comprehensive metabolic panel with GFR  Result Value Ref Range   Sodium 139 135 - 145 mEq/L   Potassium 3.9 3.5 - 5.1 mEq/L   Chloride 110 96 - 112 mEq/L   CO2 23 19 - 32 mEq/L   Glucose, Bld 84 70 - 99 mg/dL   BUN 9 6 - 23 mg/dL   Creatinine, Ser 9.06 0.40 - 1.20 mg/dL   Total Bilirubin 0.2 0.2 - 1.2 mg/dL   Alkaline Phosphatase 52 39 - 117 U/L   AST 13 0 - 37 U/L   ALT 10 0 - 35 U/L   Total Protein 7.9 6.0 - 8.3 g/dL   Albumin 4.0 3.5 - 5.2 g/dL   GFR 14.32 >39.99 mL/min   Calcium 9.2 8.4 - 10.5 mg/dL        Assessment & Plan:   Assessment and Plan Assessment & Plan Right Shoulder Pain Suspected rotator cuff tendinopathy or tear. - Recommend ibuprofen for pain and inflammation. - Referral to sports medicine  Obesity Discussed weight management  options. Previous GLP-1 trials with varying success. Phentermine considered as an alternative. - Submit prior authorization for semaglutide . - Trial phentermine starting with a lower dose for one month, then increase to maintenance dose for three months, followed by a break if PA not approved - Order metabolic panel. - Wegovy  0.25 mg samples given in office today  Polycystic Ovary Syndrome (PCOS) GLP-1 receptor agonists may aid in weight management and improve PCOS symptoms. - Submit prior authorization for semaglutide , noting PCOS as a contributing factor.  Obstructive Sleep Apnea Mild obstructive sleep apnea managed with CPAP. Consideration for repeat sleep study. - Consider repeat sleep study.  Vitamin D  Deficiency Requested vitamin D  supplementation. - Prescribe vitamin D  supplementation for eight weeks. - Recommend over-the-counter vitamin D  maintenance.  STD Screening Requested full panel STD screening. - Conduct full panel STD screening with blood work and additional tests as needed.     Orders Placed This Encounter  Procedures   HIV Antibody (routine testing w rflx)   RPR   Comprehensive metabolic panel with GFR   NuSwab Vaginitis Plus (VG+)    Standing Status:   Future    Number of Occurrences:   1    Expiration Date:   09/02/2024   Ambulatory referral to Sports Medicine    Referral Priority:   Routine    Referral Type:   Consultation    Number of Visits Requested:   1     Meds ordered this encounter  Medications   Cholecalciferol (VITAMIN D3) 50 MCG (2000 UT) capsule    Sig: Take 1 capsule (2,000 Units total) by mouth daily.    Dispense:  90 capsule    Refill:  1   Semaglutide -Weight Management 0.25 MG/0.5ML SOAJ    Sig: Inject 0.25 mg into the skin once a week.    Dispense:  2 mL    Refill:  0    Return in about 3 months (around 12/04/2023) for Meds and weight.  Corean LITTIE Ku, FNP

## 2023-09-04 LAB — HIV ANTIBODY (ROUTINE TESTING W REFLEX): HIV 1&2 Ab, 4th Generation: NONREACTIVE

## 2023-09-04 LAB — RPR: RPR Ser Ql: NONREACTIVE

## 2023-09-07 ENCOUNTER — Telehealth: Payer: Self-pay

## 2023-09-07 ENCOUNTER — Other Ambulatory Visit (HOSPITAL_COMMUNITY): Payer: Self-pay

## 2023-09-07 ENCOUNTER — Ambulatory Visit (INDEPENDENT_AMBULATORY_CARE_PROVIDER_SITE_OTHER): Admitting: Plastic Surgery

## 2023-09-07 VITALS — BP 104/69 | HR 69 | Ht 62.0 in | Wt 228.0 lb

## 2023-09-07 DIAGNOSIS — Z6841 Body Mass Index (BMI) 40.0 and over, adult: Secondary | ICD-10-CM

## 2023-09-07 DIAGNOSIS — M546 Pain in thoracic spine: Secondary | ICD-10-CM | POA: Diagnosis not present

## 2023-09-07 DIAGNOSIS — N62 Hypertrophy of breast: Secondary | ICD-10-CM | POA: Diagnosis not present

## 2023-09-07 DIAGNOSIS — F4312 Post-traumatic stress disorder, chronic: Secondary | ICD-10-CM | POA: Diagnosis not present

## 2023-09-07 DIAGNOSIS — F331 Major depressive disorder, recurrent, moderate: Secondary | ICD-10-CM | POA: Diagnosis not present

## 2023-09-07 DIAGNOSIS — M542 Cervicalgia: Secondary | ICD-10-CM | POA: Diagnosis not present

## 2023-09-07 DIAGNOSIS — N6489 Other specified disorders of breast: Secondary | ICD-10-CM

## 2023-09-07 LAB — NUSWAB VAGINITIS PLUS (VG+)
Atopobium vaginae: HIGH {score} — AB
Candida albicans, NAA: POSITIVE — AB
Candida glabrata, NAA: NEGATIVE
Chlamydia trachomatis, NAA: NEGATIVE
Megasphaera 1: HIGH {score} — AB
Neisseria gonorrhoeae, NAA: NEGATIVE
Trich vag by NAA: NEGATIVE

## 2023-09-07 MED ORDER — FLUCONAZOLE 150 MG PO TABS: 150.0000 mg | ORAL_TABLET | Freq: Once | ORAL | 0 refills | Status: DC

## 2023-09-07 MED ORDER — METRONIDAZOLE 500 MG PO TABS: 500.0000 mg | ORAL_TABLET | Freq: Two times a day (BID) | ORAL | 0 refills | Status: AC

## 2023-09-07 NOTE — Progress Notes (Deleted)
 Referring Provider Alvia Corean CROME, FNP [address] [place],  [place] [address]   CC:  Chief Complaint  Patient presents with   Follow-up      Peggy Garza is an 23 y.o. female.  HPI: ***  Allergies  Allergen Reactions   Victoza  [Liraglutide ] Other (See Comments)    Patient notes side effects of sore throat and hoarseness, however nurse documentation reports that the patient stated that her throat was closing.  Patient never sought emergency care and never had problems breathing.    Outpatient Encounter Medications as of 09/07/2023  Medication Sig   acyclovir (ZOVIRAX) 200 MG/5ML suspension Take 800 mg by mouth 3 (three) times daily as needed (Outbreak).   albuterol  (VENTOLIN  HFA) 108 (90 Base) MCG/ACT inhaler Inhale 2 puffs into the lungs every 6 (six) hours as needed for wheezing or shortness of breath (Cough).   cetirizine  (ZYRTEC ) 10 MG tablet Take 1 tablet (10 mg total) by mouth daily.   Cholecalciferol (VITAMIN D3) 50 MCG (2000 UT) capsule Take 1 capsule (2,000 Units total) by mouth daily.   estradiol  (ESTRACE ) 1 MG tablet Take 1 tablet (1 mg total) by mouth daily.   fluconazole  (DIFLUCAN ) 150 MG tablet Take 1 tablet (150 mg total) by mouth once for 1 dose.   folic acid  (FOLVITE ) 1 MG tablet Take 1 tablet (1 mg total) by mouth daily.   hydrochlorothiazide  (MICROZIDE ) 12.5 MG capsule Take 1 capsule (12.5 mg total) by mouth daily.   medroxyPROGESTERone  (PROVERA ) 10 MG tablet Take 1 tablet (10 mg total) by mouth daily. Use for ten days   metroNIDAZOLE  (FLAGYL ) 500 MG tablet Take 1 tablet (500 mg total) by mouth 2 (two) times daily for 7 days.   Semaglutide -Weight Management 0.25 MG/0.5ML SOAJ Inject 0.25 mg into the skin once a week.   topiramate  (TOPAMAX ) 100 MG tablet Take 1 tablet (100 mg total) by mouth at bedtime.   Vitamin D , Ergocalciferol , (DRISDOL ) 1.25 MG (50000 UNIT) CAPS capsule Take 1 capsule (50,000 Units total) by mouth every 7 (seven)  days.   No facility-administered encounter medications on file as of 09/07/2023.     Past Medical History:  Diagnosis Date   Allergy    Victoza    Anemia    Asthma    Genital herpes    GERD (gastroesophageal reflux disease)    Headache    03/25/22- has not had in a while   Other fatigue    PCOS (polycystic ovarian syndrome)    Pre-diabetes    Prediabetes    Shortness of breath on exertion    Sleep apnea     Past Surgical History:  Procedure Laterality Date   BREAST CYST EXCISION Bilateral 03/27/2022   Procedure: EXCISION OF BILATERAL BREAST CHRONIC ABSCESS;  Surgeon: Aron Shoulders, MD;  Location: [facility] OR;  Service: General;  Laterality: Bilateral;   TONSILLECTOMY      Family History  Problem Relation Age of Onset   Hypertension Mother    Thyroid  disease Mother    Sleep apnea Mother    Obesity Mother    Migraines Mother    Obesity Father    Sleep apnea Maternal Grandmother    Asthma Maternal Grandmother    Cancer Maternal Grandmother    Asthma Paternal Grandmother    Diabetes Paternal Grandmother     Social History   Social History Narrative   Not on file     Review of Systems General: Denies fevers, chills, weight loss CV: Denies  chest pain, shortness of breath, palpitations ***  Physical Exam    09/07/2023   10:01 AM 09/03/2023    8:13 AM 07/21/2023    9:03 AM  Vitals with BMI  Height 5' 2 5' 2 5' 2  Weight 228 lbs 230 lbs 13 oz 233 lbs  BMI 41.69 42.2 42.61  Systolic 104 110 887  Diastolic 69 68 80  Pulse 69 63 66    General:  No acute distress,  Alert and oriented, Non-Toxic, Normal speech and affect ***  Mammogram: ***  Assessment/Plan ***  Leonce KATHEE [place] 09/07/2023, 10:17 AM

## 2023-09-07 NOTE — Telephone Encounter (Signed)
 Pharmacy Patient Advocate Encounter  Received notification from [facility] that Prior Authorization for Wegovy  0.25 has been APPROVED from 09/07/23 to 03/05/24. Ran test claim, Copay is $4.00. This test claim was processed through [facility]- copay amounts may vary at other pharmacies due to pharmacy/plan contracts, or as the patient moves through the different stages of their insurance plan.   PA #/Case ID/Reference #: [id number]

## 2023-09-07 NOTE — Progress Notes (Signed)
 Peggy Garza returns today for evaluation for a possible breast reduction.  I initially saw her in November 2023.  At that time her BMI was approximately 47 and we discussed the importance of weight loss.  Since that time she has had a significant decrease in weight with a BMI now 41.  She is still complaining of upper back and neck pain secondary to large breast size and is still interested in a breast reduction.  Breast: Physical examination is unremarkable with no dominant masses.  There is no nipple discharge today.  Drainage of previous breast abscesses was done and the incisions are well-healed.  The patient sternal notch nipple distance on the right is 37 cm and 39 cm on the left her fold and nipple distance on the right is 16 cm and 18 cm on the left.  Macromastia: I believe that I can remove 500 g from the right breast and 700 g from the left breast.  We discussed breast reductions at length including the location of the incisions and the unpredictable nature of scarring and wound healing.  We discussed the risk of bleeding, infection, and seroma formation.  She understands I will use drains postoperatively.  We discussed the risk of nipple loss due to nipple ischemia.  She understands if this happens she will not have a nipple.  We discussed the postoperative limitations including no heavy lifting greater than 20 pounds, no vigorous activity, no submerging the incisions in water for 6 weeks.  She will need to wear a supportive compressive garment for 6 weeks.  She will be encouraged to begin ambulation immediately after surgery to help decrease the risk of DVT.  All questions were answered to her satisfaction.  Photographs were obtained today with her consent.  Will schedule her for bilateral breast reduction at her request.

## 2023-09-07 NOTE — Telephone Encounter (Signed)
 Pharmacy Patient Advocate Encounter   Received notification from CoverMyMeds that prior authorization for Wegovy  0.25 is required/requested.   Insurance verification completed.   The patient is insured through [facility] .   Per test claim: PA required; PA submitted to above mentioned insurance via CoverMyMeds Key/confirmation #/EOC [id number] Status is pending

## 2023-09-08 ENCOUNTER — Other Ambulatory Visit: Payer: Self-pay

## 2023-09-08 ENCOUNTER — Ambulatory Visit: Admitting: Obstetrics and Gynecology

## 2023-09-08 ENCOUNTER — Encounter: Payer: Self-pay | Admitting: Neurology

## 2023-09-08 ENCOUNTER — Encounter: Payer: Self-pay | Admitting: Plastic Surgery

## 2023-09-08 VITALS — BP 102/70 | HR 75 | Wt 227.0 lb

## 2023-09-08 DIAGNOSIS — B3731 Acute candidiasis of vulva and vagina: Secondary | ICD-10-CM | POA: Diagnosis not present

## 2023-09-08 DIAGNOSIS — N914 Secondary oligomenorrhea: Secondary | ICD-10-CM | POA: Diagnosis not present

## 2023-09-08 DIAGNOSIS — G4733 Obstructive sleep apnea (adult) (pediatric): Secondary | ICD-10-CM

## 2023-09-08 DIAGNOSIS — Z1331 Encounter for screening for depression: Secondary | ICD-10-CM

## 2023-09-08 MED ORDER — FLUCONAZOLE 150 MG PO TABS: 150.0000 mg | ORAL_TABLET | Freq: Once | ORAL | 2 refills | Status: AC

## 2023-09-08 NOTE — Telephone Encounter (Signed)
 New HST ordered to re-evaluate sleep apnea. She had to turn in her CPAP machine due to non compliance.   Orders Placed This Encounter  Procedures   Home sleep test

## 2023-09-08 NOTE — Progress Notes (Signed)
 GYNECOLOGY VISIT  Patient name: Peggy Garza MRN [id number]  Date of birth: 12/17/2000 Chief Complaint:   Follow-up   History:  Peggy Garza is a 23 y.o. G0P0000 being seen today for amenorrhea follow up. Currently on day 11 of E2 and has not had any bleeding since last visit. Also notes having recurrent yeast infections.  Diflucan  x 2 and it will go away and it will come back with intercourse. Does not happen without intercourse. Has not tried using non-latex condoms. No known latex allergy.  Last visit: seen for amenorrhea x1 year. Provera  for withdrawal bleed then bith control afterwards. Batriatric referral and pelvic US . Pap completed Messaged that provera  did not cause bleeding - give estradiol  to prep endometrium and additional labs ordered  Past Medical History:  Diagnosis Date   Allergy    Victoza    Anemia    Asthma    Genital herpes    GERD (gastroesophageal reflux disease)    Headache    03/25/22- has not had in a while   Other fatigue    PCOS (polycystic ovarian syndrome)    Pre-diabetes    Prediabetes    Shortness of breath on exertion    Sleep apnea     Past Surgical History:  Procedure Laterality Date   BREAST CYST EXCISION Bilateral 03/27/2022   Procedure: EXCISION OF BILATERAL BREAST CHRONIC ABSCESS;  Surgeon: Aron Shoulders, MD;  Location: [facility] OR;  Service: General;  Laterality: Bilateral;   TONSILLECTOMY      The following portions of the patient's history were reviewed and updated as appropriate: allergies, current medications, past family history, past medical history, past social history, past surgical history and problem list.   Health Maintenance:   Last pap     Component Value Date/Time   DIAGPAP (A) 07/16/2023 0927    - Atypical squamous cells of undetermined significance (ASC-US )   HPVHIGH Negative 07/16/2023 0927   ADEQPAP  07/16/2023 0927    Satisfactory for evaluation; transformation zone component PRESENT.    Last mammogram:  n/a   Review of Systems:  Pertinent items are noted in HPI. Comprehensive review of systems was otherwise negative.   Objective:  Physical Exam BP 102/70   Pulse 75   Wt 227 lb (103 kg)   BMI 41.52 kg/m    Physical Exam Vitals and nursing note reviewed.  Constitutional:      Appearance: Normal appearance.  HENT:     Head: Normocephalic and atraumatic.  Pulmonary:     Effort: Pulmonary effort is normal.  Skin:    General: Skin is warm and dry.  Neurological:     General: No focal deficit present.     Mental Status: She is alert.  Psychiatric:        Mood and Affect: Mood normal.        Behavior: Behavior normal.        Thought Content: Thought content normal.        Judgment: Judgment normal.      Labs and Imaging US  PELVIC COMPLETE WITH TRANSVAGINAL Result Date: 09/03/2023 CLINICAL DATA:  Oligomenorrhea EXAM: TRANSABDOMINAL AND TRANSVAGINAL ULTRASOUND OF PELVIS TECHNIQUE: Both transabdominal and transvaginal ultrasound examinations of the pelvis were performed. Transabdominal technique was performed for global imaging of the pelvis including uterus, ovaries, adnexal regions, and pelvic cul-de-sac. It was necessary to proceed with endovaginal exam following the transabdominal exam to visualize the uterus endometrium adnexa. COMPARISON:  Ultrasound 08/14/2020 FINDINGS: Uterus Measurements: 7.9 x 3.4 x 4  cm = volume: 56.7 mL. No fibroids or other mass visualized. Endometrium Thickness: 6.2 mm.  No focal abnormality visualized. Right ovary Measurements: 4.4 x 3.1 x 3.2 cm = volume: 22.8 mL. Normal appearance/no adnexal mass. Left ovary Measurements: 3.8 x 3.4 x 2 cm = volume: 13.7 mL. Normal appearance/no adnexal mass. Other findings Trace free fluid IMPRESSION: Negative pelvic ultrasound. Electronically Signed   By: Luke Bun M.D.   On: 09/03/2023 23:58       Assessment & Plan:   1. Secondary oligomenorrhea (Primary) Noted that E2 and prolactin were normal. Pelvic US   showed fairly normal EMS. Will complete current E2/provera  trial and if no withdrawal bleed, will plan for hysteroscopy.   2. Yeast vaginitis Noted that A1c was normal and not likely that she has necessarily developed worsened diabetes especially if it only occurs with intercourse. Advised to assess partners' products used and can trial non-latex condoms.  - fluconazole  (DIFLUCAN ) 150 MG tablet; Take 1 tablet (150 mg total) by mouth once for 1 dose. Every 3 days  Dispense: 2 tablet; Refill: 2  Reviewed surgical risks in case of need for hysteroscopy.   The risks of surgery were discussed in detail with the patient including but not limited to: bleeding which may require transfusion or reoperation; infection which may require prolonged hospitalization or re-hospitalization and antibiotic therapy; injury to bowel, bladder, ureters and major vessels or other surrounding organs which may lead to other procedures; formation of adhesions; need for additional procedures including laparotomy or subsequent procedures secondary to intraoperative injury or abnormal pathology; thromboembolic phenomenon; incisional problems and other postoperative or anesthesia complications.  Patient was told that the likelihood that her condition and symptoms will be treated effectively with this surgical management was high; the postoperative expectations were also discussed in detail. The patient also understands the alternative treatment options which were discussed in full. All questions were answered.    Carter Quarry, MD Minimally Invasive Gynecologic [facility], [facility]

## 2023-09-08 NOTE — Addendum Note (Signed)
 Addended by: GAYLAND LAURAINE PARAS on: 09/08/2023 03:29 PM   Modules accepted: Orders

## 2023-09-14 DIAGNOSIS — F331 Major depressive disorder, recurrent, moderate: Secondary | ICD-10-CM | POA: Diagnosis not present

## 2023-09-14 DIAGNOSIS — F4312 Post-traumatic stress disorder, chronic: Secondary | ICD-10-CM | POA: Diagnosis not present

## 2023-09-17 NOTE — Progress Notes (Unsigned)
    [name] [address] [place] [address] Phone: [phone]   Assessment and Plan:     There are no diagnoses linked to this encounter.  ***   Pertinent previous records reviewed include ***    Follow Up: ***     Subjective:   I, Abdishakur Gottschall, am serving as a Neurosurgeon for Doctor Morene Mace  Chief Complaint: right shoulder pain   HPI:   09/18/2023 Patient is a 23 year old female with right shoulder pain. Patient states   Relevant Historical Information: ***  Additional pertinent review of systems negative.   Current Outpatient Medications:    acyclovir (ZOVIRAX) 200 MG/5ML suspension, Take 800 mg by mouth 3 (three) times daily as needed (Outbreak)., Disp: , Rfl:    albuterol  (VENTOLIN  HFA) 108 (90 Base) MCG/ACT inhaler, Inhale 2 puffs into the lungs every 6 (six) hours as needed for wheezing or shortness of breath (Cough)., Disp: 18 g, Rfl: 0   cetirizine  (ZYRTEC ) 10 MG tablet, Take 1 tablet (10 mg total) by mouth daily., Disp: 30 tablet, Rfl: 11   Cholecalciferol (VITAMIN D3) 50 MCG (2000 UT) capsule, Take 1 capsule (2,000 Units total) by mouth daily., Disp: 90 capsule, Rfl: 1   estradiol  (ESTRACE ) 1 MG tablet, Take 1 tablet (1 mg total) by mouth daily., Disp: 35 tablet, Rfl: 2   folic acid  (FOLVITE ) 1 MG tablet, Take 1 tablet (1 mg total) by mouth daily., Disp: 30 tablet, Rfl: 5   hydrochlorothiazide  (MICROZIDE ) 12.5 MG capsule, Take 1 capsule (12.5 mg total) by mouth daily., Disp: 30 capsule, Rfl: 1   medroxyPROGESTERone  (PROVERA ) 10 MG tablet, Take 1 tablet (10 mg total) by mouth daily. Use for ten days (Patient not taking: Reported on 09/08/2023), Disp: 10 tablet, Rfl: 2   Semaglutide -Weight Management 0.25 MG/0.5ML SOAJ, Inject 0.25 mg into the skin once a week., Disp: 2 mL, Rfl: 0   topiramate  (TOPAMAX ) 100 MG tablet, Take 1 tablet (100 mg total) by mouth at bedtime., Disp: 30 tablet, Rfl: 5   Vitamin  D, Ergocalciferol , (DRISDOL ) 1.25 MG (50000 UNIT) CAPS capsule, Take 1 capsule (50,000 Units total) by mouth every 7 (seven) days., Disp: 8 capsule, Rfl: 0   Objective:     There were no vitals filed for this visit.    There is no height or weight on file to calculate BMI.    Physical Exam:    ***   Electronically signed by:  Odis Mace [facility] 3:07 PM 09/17/23

## 2023-09-18 ENCOUNTER — Ambulatory Visit: Admitting: Sports Medicine

## 2023-09-18 VITALS — BP 120/80 | HR 75 | Ht 62.0 in | Wt 225.0 lb

## 2023-09-18 DIAGNOSIS — M25511 Pain in right shoulder: Secondary | ICD-10-CM

## 2023-09-18 DIAGNOSIS — M7551 Bursitis of right shoulder: Secondary | ICD-10-CM

## 2023-09-18 MED ORDER — MELOXICAM 15 MG PO TABS: 15.0000 mg | ORAL_TABLET | Freq: Every day | ORAL | 0 refills | Status: AC

## 2023-09-18 NOTE — Patient Instructions (Signed)
 Shoulder HEP  - Start meloxicam 15 mg daily x2 weeks.  If still having pain after 2 weeks, complete 3rd-week of NSAID. May use remaining NSAID as needed once daily for pain control.  Do not to use additional over-the-counter NSAIDs (ibuprofen, naproxen, Advil, Aleve, etc.) while taking prescription NSAIDs.  May use Tylenol [phone] mg 2 to 3 times a day for breakthrough pain. 4 week follow up

## 2023-09-21 DIAGNOSIS — F4312 Post-traumatic stress disorder, chronic: Secondary | ICD-10-CM | POA: Diagnosis not present

## 2023-09-21 DIAGNOSIS — F331 Major depressive disorder, recurrent, moderate: Secondary | ICD-10-CM | POA: Diagnosis not present

## 2023-09-23 ENCOUNTER — Encounter: Payer: Self-pay | Admitting: Family Medicine

## 2023-09-23 ENCOUNTER — Other Ambulatory Visit: Payer: Self-pay

## 2023-09-23 DIAGNOSIS — Z113 Encounter for screening for infections with a predominantly sexual mode of transmission: Secondary | ICD-10-CM | POA: Diagnosis not present

## 2023-09-23 MED ORDER — SEMAGLUTIDE-WEIGHT MANAGEMENT 0.5 MG/0.5ML ~~LOC~~ SOAJ: 0.5000 mg | SUBCUTANEOUS | 0 refills | Status: DC

## 2023-09-24 ENCOUNTER — Other Ambulatory Visit (HOSPITAL_COMMUNITY): Payer: Self-pay

## 2023-09-24 ENCOUNTER — Telehealth: Payer: Self-pay

## 2023-09-24 NOTE — Telephone Encounter (Signed)
 Pharmacy Patient Advocate Encounter   Received notification from Onbase that prior authorization for Wegovy  0.5 is required/requested.   Insurance verification completed.   The patient is insured through [facility] .   Per test claim: Refill too soon. PA is not needed at this time. Medication was filled 09/07/23. Next eligible fill date is next date not given.  See encounter 09/07/23. Current PA expires 03/05/24

## 2023-09-29 ENCOUNTER — Other Ambulatory Visit (HOSPITAL_COMMUNITY): Payer: Self-pay

## 2023-09-30 ENCOUNTER — Other Ambulatory Visit: Payer: Self-pay

## 2023-09-30 MED ORDER — SEMAGLUTIDE-WEIGHT MANAGEMENT 0.5 MG/0.5ML ~~LOC~~ SOAJ: 0.5000 mg | SUBCUTANEOUS | 0 refills | Status: DC

## 2023-10-05 DIAGNOSIS — F331 Major depressive disorder, recurrent, moderate: Secondary | ICD-10-CM | POA: Diagnosis not present

## 2023-10-05 DIAGNOSIS — F4312 Post-traumatic stress disorder, chronic: Secondary | ICD-10-CM | POA: Diagnosis not present

## 2023-10-08 ENCOUNTER — Encounter: Payer: Self-pay | Admitting: Obstetrics and Gynecology

## 2023-10-12 DIAGNOSIS — F4312 Post-traumatic stress disorder, chronic: Secondary | ICD-10-CM | POA: Diagnosis not present

## 2023-10-12 DIAGNOSIS — F331 Major depressive disorder, recurrent, moderate: Secondary | ICD-10-CM | POA: Diagnosis not present

## 2023-10-15 NOTE — Progress Notes (Unsigned)
 [name] [address] [place] [address] Phone: [phone]   Assessment and Plan:     1. Acute pain of right shoulder (Primary) 2. Subacromial bursitis of right shoulder joint -Acute, improved, subsequent visit -Consistent with improved subacromial bursitis after completing NSAID course, HEP - Use meloxicam  15 mg daily as needed for pain.  Recommend limiting chronic NSAIDs to 1-2 doses per week to prevent long-term side effects. -Continue intermittent HEP to prevent recurrence of pain  Pertinent previous records reviewed include none   Follow Up: As needed   Subjective:   I, Peggy Garza, am serving as a Neurosurgeon for Doctor Morene Mace   Chief Complaint: right shoulder pain    HPI:    09/18/2023 Patient is a 23 year old female with right shoulder pain. Patient states pain for about 3 weeks. No MOI. Anterior shoulder pain. No radiating pain. No meds. Decreased ROM due to pain. No numbness or tingling.    10/16/2023 Patient states she is doing alright.    Relevant Historical Information: Prediabetes  Additional pertinent review of systems negative.   Current Outpatient Medications:    acyclovir (ZOVIRAX) 200 MG/5ML suspension, Take 800 mg by mouth 3 (three) times daily as needed (Outbreak)., Disp: , Rfl:    albuterol  (VENTOLIN  HFA) 108 (90 Base) MCG/ACT inhaler, Inhale 2 puffs into the lungs every 6 (six) hours as needed for wheezing or shortness of breath (Cough)., Disp: 18 g, Rfl: 0   cetirizine  (ZYRTEC ) 10 MG tablet, Take 1 tablet (10 mg total) by mouth daily., Disp: 30 tablet, Rfl: 11   Cholecalciferol (VITAMIN D3) 50 MCG (2000 UT) capsule, Take 1 capsule (2,000 Units total) by mouth daily., Disp: 90 capsule, Rfl: 1   estradiol  (ESTRACE ) 1 MG tablet, Take 1 tablet (1 mg total) by mouth daily., Disp: 35 tablet, Rfl: 2   folic acid  (FOLVITE ) 1 MG tablet, Take 1 tablet (1 mg total) by mouth daily., Disp: 30  tablet, Rfl: 5   hydrochlorothiazide  (MICROZIDE ) 12.5 MG capsule, Take 1 capsule (12.5 mg total) by mouth daily., Disp: 30 capsule, Rfl: 1   medroxyPROGESTERone  (PROVERA ) 10 MG tablet, Take 1 tablet (10 mg total) by mouth daily. Use for ten days (Patient not taking: Reported on 09/08/2023), Disp: 10 tablet, Rfl: 2   meloxicam  (MOBIC ) 15 MG tablet, Take 1 tablet (15 mg total) by mouth daily., Disp: 30 tablet, Rfl: 0   semaglutide -weight management (WEGOVY ) 0.5 MG/0.5ML SOAJ SQ injection, Inject 0.5 mg into the skin once a week., Disp: 2 mL, Rfl: 0   Semaglutide -Weight Management 0.25 MG/0.5ML SOAJ, Inject 0.25 mg into the skin once a week., Disp: 2 mL, Rfl: 0   topiramate  (TOPAMAX ) 100 MG tablet, Take 1 tablet (100 mg total) by mouth at bedtime., Disp: 30 tablet, Rfl: 5   Vitamin D , Ergocalciferol , (DRISDOL ) 1.25 MG (50000 UNIT) CAPS capsule, Take 1 capsule (50,000 Units total) by mouth every 7 (seven) days., Disp: 8 capsule, Rfl: 0   Objective:     Vitals:   10/16/23 0845  Pulse: 71  SpO2: 98%  Weight: 224 lb (101.6 kg)  Height: 5' 2 (1.575 m)      Body mass index is 40.97 kg/m.    Physical Exam:    Gen: Appears well, nad, nontoxic and pleasant Neuro:sensation intact, strength is 5/5 with df/pf/inv/ev, muscle tone wnl Skin: no suspicious lesion or defmority Psych: A&O, appropriate mood and affect  Right shoulder:  No deformity, swelling  or muscle wasting No scapular winging FF 180, abd 180, int 0, ext 90 NTTP over the [place], clavicle, ac, coracoid, biceps groove, humerus, deltoid, trapezius, cervical spine Neg neer, hawkins, empty can, obriens, crossarm, subscap liftoff, speeds Neg ant drawer, sulcus sign, apprehension Negative Spurling's test bilat FROM of neck    Electronically signed by:  Odis Mace [facility] 8:50 AM 10/16/23

## 2023-10-16 ENCOUNTER — Ambulatory Visit: Admitting: Sports Medicine

## 2023-10-16 VITALS — HR 71 | Ht 62.0 in | Wt 224.0 lb

## 2023-10-16 DIAGNOSIS — M7551 Bursitis of right shoulder: Secondary | ICD-10-CM | POA: Diagnosis not present

## 2023-10-16 DIAGNOSIS — M25511 Pain in right shoulder: Secondary | ICD-10-CM | POA: Diagnosis not present

## 2023-10-19 DIAGNOSIS — F331 Major depressive disorder, recurrent, moderate: Secondary | ICD-10-CM | POA: Diagnosis not present

## 2023-10-19 DIAGNOSIS — F4312 Post-traumatic stress disorder, chronic: Secondary | ICD-10-CM | POA: Diagnosis not present

## 2023-10-23 DIAGNOSIS — Z20822 Contact with and (suspected) exposure to covid-19: Secondary | ICD-10-CM | POA: Diagnosis not present

## 2023-10-23 DIAGNOSIS — J029 Acute pharyngitis, unspecified: Secondary | ICD-10-CM | POA: Diagnosis not present

## 2023-10-26 DIAGNOSIS — F331 Major depressive disorder, recurrent, moderate: Secondary | ICD-10-CM | POA: Diagnosis not present

## 2023-10-26 DIAGNOSIS — F4312 Post-traumatic stress disorder, chronic: Secondary | ICD-10-CM | POA: Diagnosis not present

## 2023-10-28 IMAGING — US US THYROID
1 series · 14 of 25 positions shown · non-contrast
Comparison: None Available.

CLINICAL DATA: Palpable abnormality.  Goiter on physical exam.

EXAM:
THYROID ULTRASOUND
TECHNIQUE: Ultrasound examination of the thyroid gland and adjacent soft
tissues was performed.

[Series 1: us thyroid · 0.06mm/px · 14 of 44 slices shown]
[im 1/44]
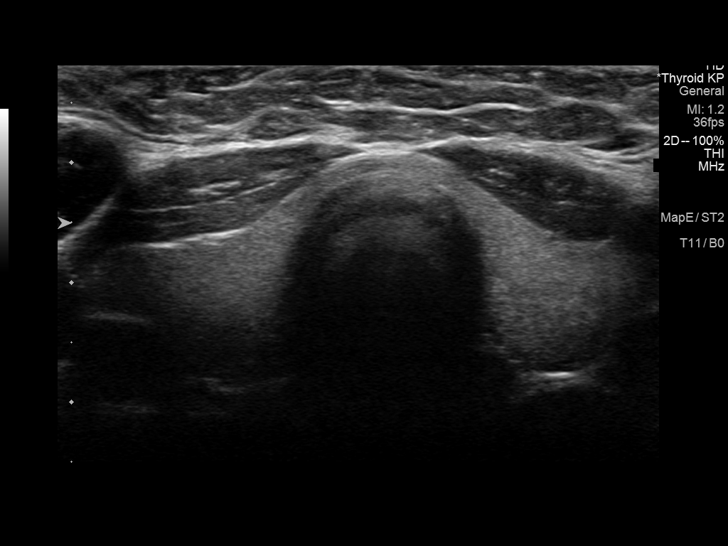
[im 4/44]
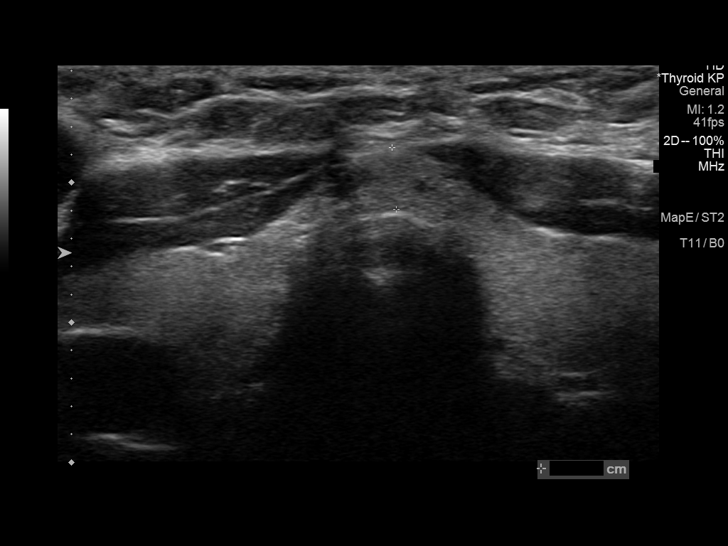
[im 8/44]
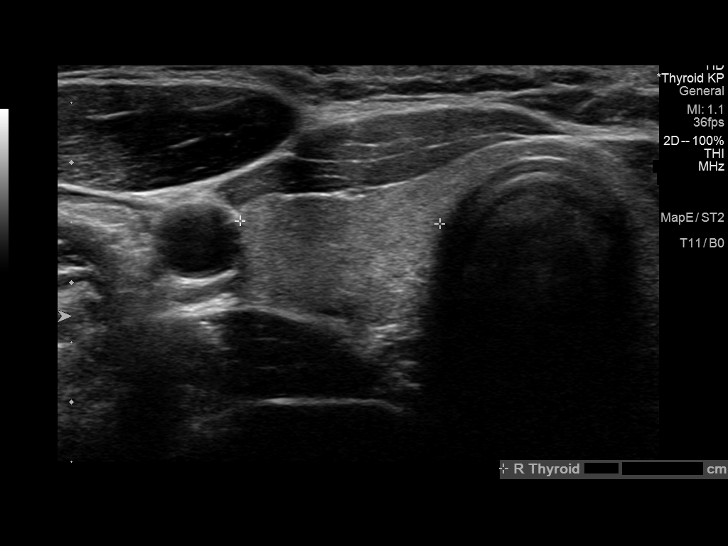
[im 11/44]
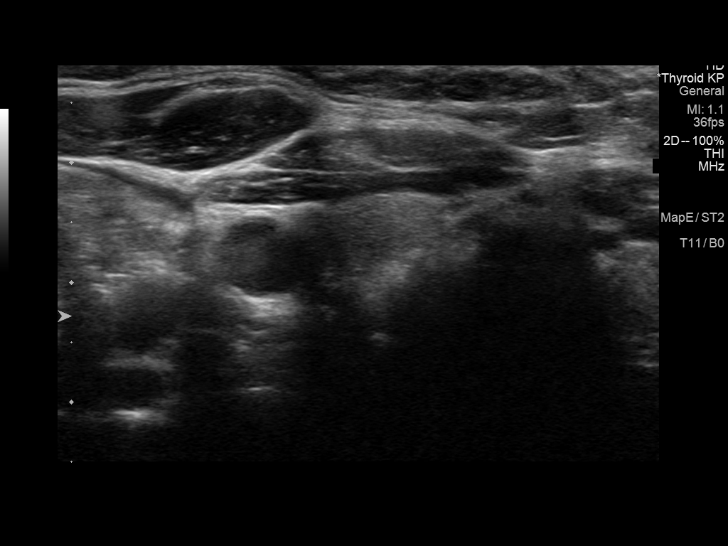
[im 15/44]
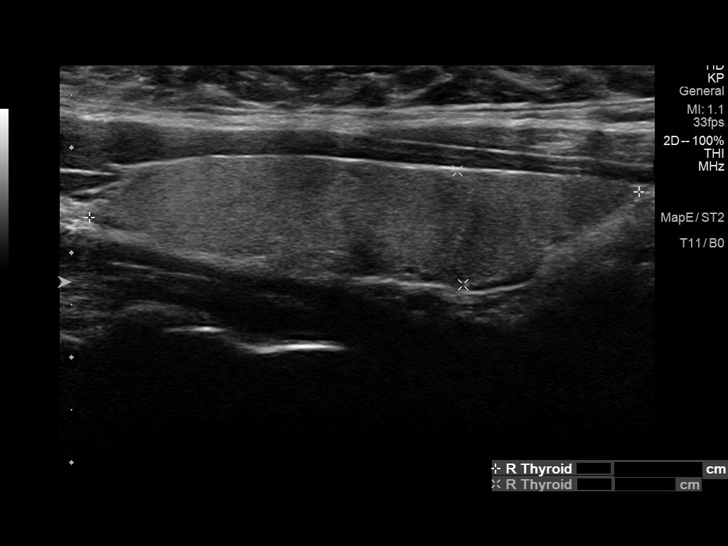
[im 17/44]
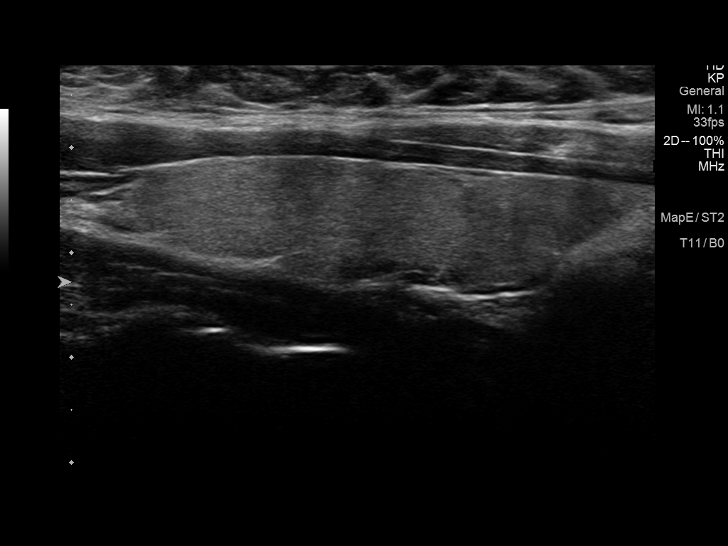
[im 20/44]
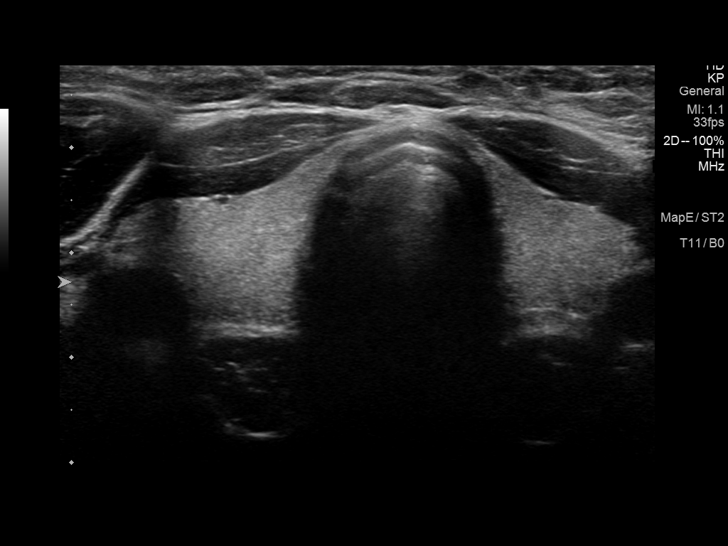
[im 24/44]
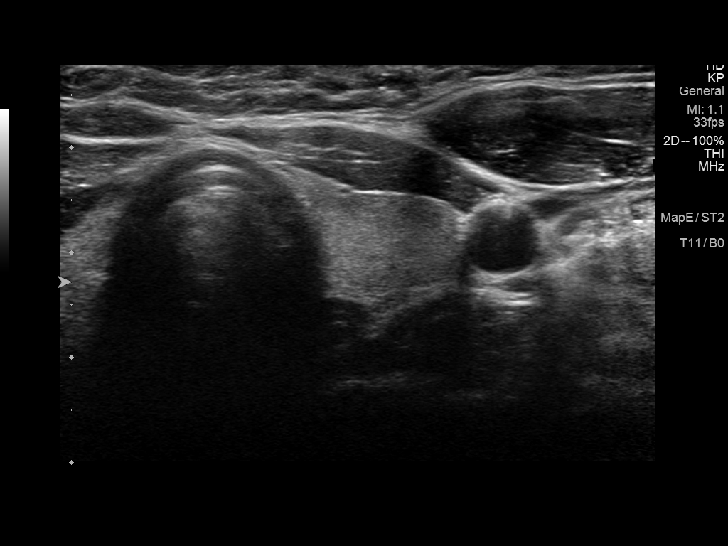
[im 27/44]
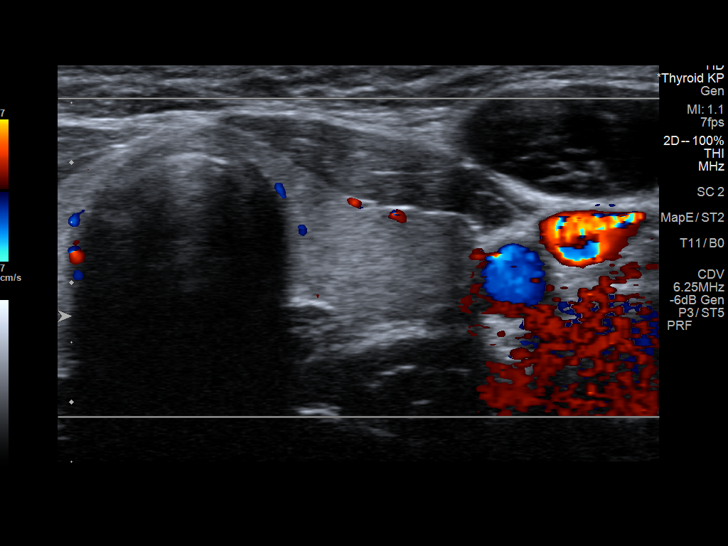
[im 29/44]
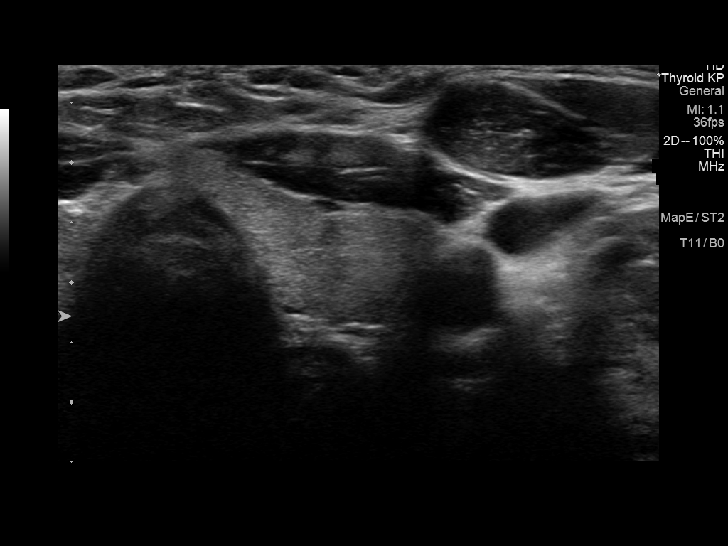
[im 33/44]
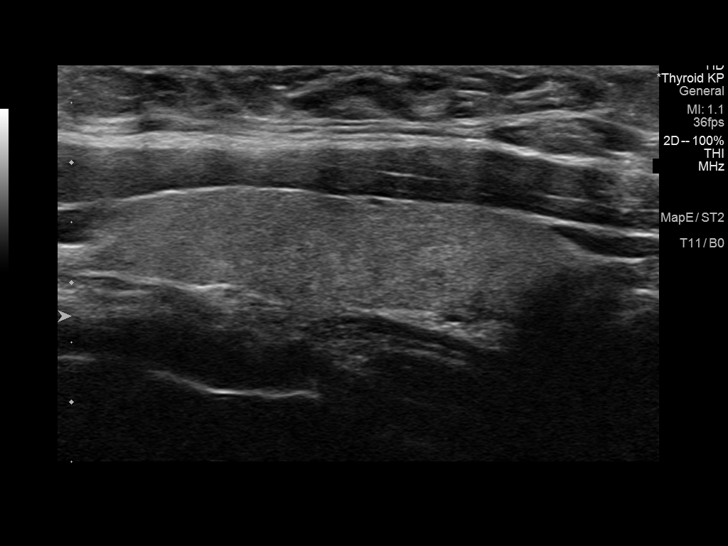
[im 36/44]
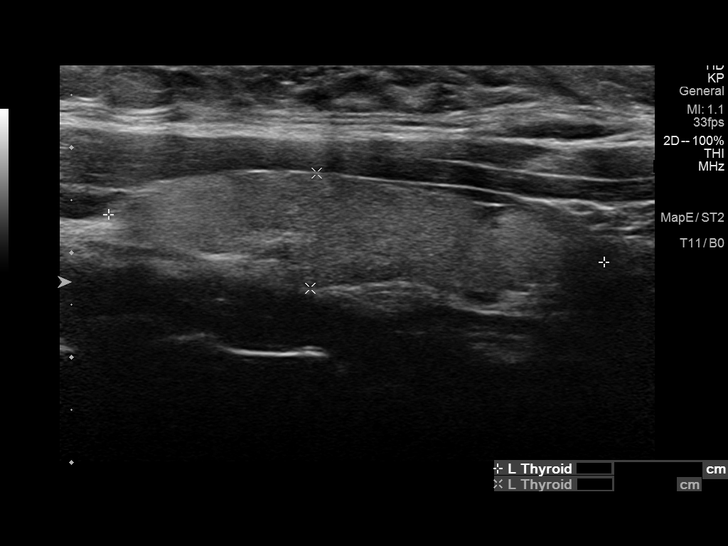
[im 40/44]
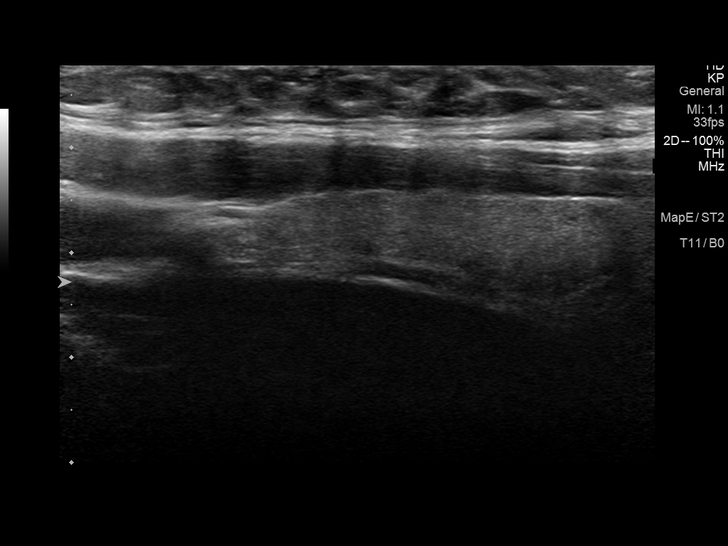
[im 44/44]
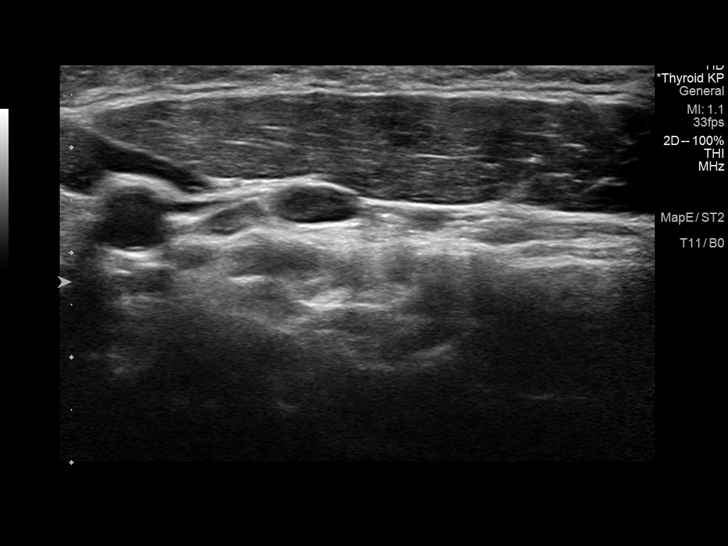

[14 of 25 positions shown; findings below may reference images not displayed]

FINDINGS: Parenchymal Echotexture: Normal

Isthmus: 0.4 cm

Right lobe: 5.3 x 1.1 x 1.7 cm

Left lobe: 4.8 x 1.1 x 1.5 cm

_________________________________________________________

Estimated total number of nodules >/= 1 cm: 0

Number of spongiform nodules >/=  2 cm not described below (TR1): 0

Number of mixed cystic and solid nodules >/= 1.5 cm not described
below (TR2): 0

_________________________________________________________

No discrete nodules are seen within the thyroid gland.
IMPRESSION: Normal sonographic appearance of the thyroid gland.

## 2023-11-02 DIAGNOSIS — F4312 Post-traumatic stress disorder, chronic: Secondary | ICD-10-CM | POA: Diagnosis not present

## 2023-11-02 DIAGNOSIS — F331 Major depressive disorder, recurrent, moderate: Secondary | ICD-10-CM | POA: Diagnosis not present

## 2023-11-05 DIAGNOSIS — Z7251 High risk heterosexual behavior: Secondary | ICD-10-CM | POA: Diagnosis not present

## 2023-11-09 DIAGNOSIS — F331 Major depressive disorder, recurrent, moderate: Secondary | ICD-10-CM | POA: Diagnosis not present

## 2023-11-09 DIAGNOSIS — F4312 Post-traumatic stress disorder, chronic: Secondary | ICD-10-CM | POA: Diagnosis not present

## 2023-11-16 DIAGNOSIS — F4312 Post-traumatic stress disorder, chronic: Secondary | ICD-10-CM | POA: Diagnosis not present

## 2023-11-19 DIAGNOSIS — R1011 Right upper quadrant pain: Secondary | ICD-10-CM | POA: Diagnosis not present

## 2023-11-19 DIAGNOSIS — Z113 Encounter for screening for infections with a predominantly sexual mode of transmission: Secondary | ICD-10-CM | POA: Diagnosis not present

## 2023-11-19 DIAGNOSIS — N760 Acute vaginitis: Secondary | ICD-10-CM | POA: Diagnosis not present

## 2023-11-19 NOTE — Progress Notes (Signed)
 Patient ID: Peggy Garza is a 23 y.o. female.  Allergies[1] Problem List[2]  Chief Complaint  Patient presents with  . Abdominal Pain    URQ pain since early this morning. Pt states she was woken out of her sleep/ pt states its stabbing pain. Also requesting STI testing due to her boyfriend having discharge. Pt denies any STI symptoms. No medicine on board.    History of Present Illness: HPI  History of Present Illness This is a 23 year old female presenting with abdominal pain and for STD screening.  The patient reports experiencing sharp, stabbing abdominal pain located under her right breast, which began this morning during sleep and was severe enough to awaken her. The pain has since subsided and is not currently present. She does not believe the pain was related to gas. Her last bowel movement was either yesterday or the day before, characterized by pebble-like stools. She endorses straining with the bowel movement but denies bright red rectal bleeding or black stool.  She denies nausea, vomiting or diarrhea.  No urinary symptoms, including frequency, urgency or dysuria.  The patient desires comprehensive STD screening for all possible etiologies, including gonorrhea, chlamydia, yeast, bacterial vaginosis, and trichomoniasis, despite being asymptomatic. She also requests blood work for HIV, syphilis, and hepatitis C. She expresses concern about potential exposure to these infections as her boyfriend has recently began experiencing symptoms.  He has not tested positive for anything at this time, but is also undergoing screening.    Denies/No report of: Rash, fever, fatigue, chest pain, exertional dyspnea, palpitations, joint aches/pains, urinary symptoms, nausea, vomiting, diarrhea, constipation Review of Systems: Review of Systems  All others reviewed and negative except as listed above.  Objective: Vitals:   11/19/23 1755  BP: 110/69  BP Location: Left arm  Patient Position:  Sitting  Pulse: 67  Resp: 17  Temp: 97.6 F (36.4 C)  TempSrc: Tympanic  SpO2: 100%  Weight: 100 kg (220 lb 12.8 oz)  Height: 1.575 m (5' 2)     Physical Exam: Physical Exam Constitutional:      General: Patient is not in acute distress.    Appearance: Normal appearance.  Neurological:     General: No focal deficit present.     Mental Status: alert and oriented to person, place, and time.  HENT:     Eyes:     Conjunctiva/sclera: Conjunctivae normal; sclera clear.     Pupils: Pupils are equal, round, and reactive to light.    Pharynx benign. Mucus membranes moist Cardiovascular:     Heart sounds: Normal heart sounds; regular. No murmur heard.    No Lower extremity edema noted Pulmonary:     Effort: Pulmonary effort is normal. No conversational dyspnea.     Breath sounds: Clear lung fields. No wheezing, rhonchi or rales.  Abdominal:     General: Bowel sounds are normal. There is no distension.     Tenderness: Soft. RUQ and epigastric abdominal tenderness. No rebound or guarding.    No HSM. Musculoskeletal:        General: No apparent joint or muscle limitation.     Neck:  No rigidity; supple.  Skin:    General: Skin is warm and dry. No rash, lesions, erythema or ecchymosis Psychiatric:        Mood and Affect: Normal affect. Mood good. Appears to have good judgement.  GU:  Vaginal exam not preformed; deferred per patient preference. Patient obtained self swab for analysis.   Radiologist interpretation:  XR Abdomen  1 View  Final Result by Rankin Ina Slice, MD (10/09 1906)  XR ABDOMEN 1 VIEW, 11/19/2023 6:54 PM     INDICATION: Right upper quadrant pain \ R10.11 Right upper quadrant pain   COMPARISON: None.    TECHNIQUE: Supine radiograph(s) of the abdomen were obtained.    FINDINGS:    . Gastrointestinal tract: Nonobstructive bowel gas pattern.  . Calcifications: No apparent pathologic calcification.  . Musculoskeletal: No acute bony abnormality.  . Lower  chest: No acute process.  . Lines/Tubes/Surgical: None.  . Additional comments: None.    IMPRESSION:    Nonobstructive bowel gas pattern.            I have personally reviewed radiographic images.   My interpretation is: Nonobstructive bowel gas pattern with moderate stool burden. No orders of the defined types were placed in this encounter.   Results for orders placed or performed in visit on 11/19/23  POC Urinalysis Auto without Microscopic  Result Value Ref Range   Color, Urine Amber (A) Yellow   Clarity, Urine Cloudy (A) Clear   Glucose, Urine Negative Negative mg/dL   Bilirubin, Urine Small (A) Negative   Ketones, Urine Negative Negative mg/dL   Specific Gravity, Urine >=1.030 (A) 1.010, 1.015, 1.020, 1.025   Blood, Urine Negative Negative   pH, Urine 5.5 5.0, 5.5, 6.0, 6.5, 7.0, 7.5, 8.0   Protein, Urine 30 (A) Negative mg/dL   Urobilinogen, Urine 0.2 <2.0 mg/dL   Nitrite, Urine Negative Negative   Leukocyte Esterase, Urine Negative Negative   Kit/Device Lot # [id number]    Kit/Device Expiration Date 08/09/2024   POC HCG Qualitative, Urine  Result Value Ref Range   HCG, Urine, POC Negative Negative   Internal Control Acceptable    Kit/Device Lot # [id number]    Kit/Device Expiration Date 01/09/2025    Urinalysis reviewed.  Proteinuria, otherwise unremarkable. Urine pregnancy test reviewed and negative.  Lab work, POC testing and/or xray(s) were reviewed and incorporated into the decision making process. Further disposition based on pending labs if needed  Assessment: Peggy Garza was seen today for abdominal pain.  Diagnoses and all orders for this visit:  Right upper quadrant abdominal pain -     POC Urinalysis Auto without Microscopic -     POC HCG Qualitative, Urine -     XR Abdomen 1 View  Screening for STD (sexually transmitted disease) -     Bacterial Vaginosis (BV), Candida, and Trichomonas via NAA (Swab) -     Chlamydia / Gonococcus (GC), NAAT -      Hepatitis C Virus (HCV) Antibody Screen With Confirmation -     HIV Screen with Reflex to Confirmation -     Rapid Plasma Reagin (RPR), Qualitative Test with Reflex to Titer and Confirmation   . Assessment & Plan Initial Assessment: 23 year old female presents with sharp abdominal pain under her breast that started this morning and woke her from sleep. Pain described as stabbing. No current pain. Last bowel movement the day before yesterday,  pebble-like stool; straining.  No melena or hematochezia.  Symptoms not resolved.  Differential Diagnosis: - Constipation: Tenderness in right epigastrium. Moderate stool burden on KUB x-ray. Fiber supplements and MiraLAX recommended. Regular exercise suggested. - Gas pain: Unlikely due to lack of bowel movement since pain onset. - PCOS: History of same.  No lower abdominal pain, no pelvic tenderness on exam.  Unlikely. STD:-Abdominal symptoms unlikely.  Comprehensive screening completed today, including gonorrhea, chlamydia, trichomoniasis, bacterial vaginosis, candidiasis, HIV, syphilis and  hepatitis C.  Treatment pending abnormal results.  UC course: - Urinalysis shows proteinuria, otherwise unremarkable. - Negative urine pregnancy test. - KUB x-ray read by me shows nonobstructive bowel gas pattern with  stool burden consistent with constipation. - Vaginal swab for gonorrhea, chlamydia. - Vaginal swab for bacterial vaginosis candidiasis and trichomoniasis. - Lab testing for HIV, syphilis and hepatitis C.  Final Assessment: Sharp abdominal pain likely due to constipation and associated gas. KUB x-ray confirms moderate stool burden. Urinalysis shows proteinuria. Negative urine pregnancy test.  Comprehensive STD testing due to partner with symptoms, treatment pending results.  Clinical Impression: - Constipation - STD screening  Disposition: - Follow-Up: Await results of STD testing. Contact clinic if symptoms develop. - Return to urgent care or  follow-up with family doctor for return of abdominal pain, worsening symptoms, onset of fever, diarrhea, nausea, vomiting, or other worrisome symptoms.  Patient Education: Advised to incorporate fiber into diet through foods or supplements like Benefiber gummies. MiraLAX powder recommended for treating constipation, safe to take daily or twice a day if needed. Regular exercise suggested to stimulate bowel movements. Information provided about discussed topics.    DDX: ovarian torsion, ectopic pregnancy, pregnancy, UTI, pelvic inflammatory disease, HIV, syphilis, hepatitis, gonorrhea, chlamydia, vaginal candidiasis, bacteria vaginosis, trichomoniasis  Anticipated course of illness has been reviewed. If not improving as discussed or in the expected timeframe, patient has been advised to seek follow up with their PCP, UC or the ED.  Management of their illness and symptoms has been discussed. Patient was given verbal and written instructions on symptoms that necessitate return to the UC/ED, and instructed to f/u with UC or PCP if not improving.  Anticipated follow up, or next steps in care was reviewed with the patient.  They were provided with written information for reference. Urgent Care Disposition: Home Care  Patient aware they will be contacted regarding results of any pending laboratory results ONLY if the results are abnormal. If abnormal, appropriate treatment measures will be taken based upon results.  Patient education (verbal/handout) given on diagnosis, pathophysiology, treatment of diagnosis, medications & side effects, exercises, the follow-up plan and supportives measures.  We discussed risks and side effects of medications, and also discussed red flags which would warrant immediate follow-up. They should activate EMS, or 911 if red flag symptoms emerge.  Patient and/or parent/guardian (if applicable) agreed with plan and voiced understanding.  No barriers to adherence perceived by  myself.  Electronically signed by Sonny Caldron Arledge  Thu 11/19/2023 7:14 PM        [1] Allergies Allergen Reactions  . Liraglutide  Other (See Comments)    Patient notes side effects of sore throat and hoarseness, however nurse documentation reports that the patient stated that her throat was closing.  Patient never sought emergency care and never had problems breathing.  [2] Patient Active Problem List Diagnosis  . Bacterial vaginosis  . Candidiasis of vagina  . Bilateral pendulous breasts  . Breast cyst, unspecified laterality  . Chronic abscess of breast  . Chronic migraine without aura without status migrainosus, not intractable  . Class 3 severe obesity with serious comorbidity and body mass index (BMI) of 45.0 to 49.9 in adult  . Genital herpes simplex  . Hydromyelia    (CMD)  . Hyperglycemia  . Hypertriglyceridemia  . MDD (major depressive disorder), recurrent episode, mild  . Morning headache  . PCOS (polycystic ovarian syndrome)  . Prediabetes  . PTSD (post-traumatic stress disorder)  . Vitamin D  deficiency  .  OSA (obstructive sleep apnea)

## 2023-11-20 DIAGNOSIS — A549 Gonococcal infection, unspecified: Secondary | ICD-10-CM | POA: Diagnosis not present

## 2023-11-20 NOTE — Telephone Encounter (Signed)
 Patient presents to [facility] urgent care for Rocephin  injection.  Doxy/7 day is rx'd due to partner positive for Chlamydia.  Complete lab report pending only RPR confirmatory testing to result.    All education required has been provided with verbal understanding.  DOH report via online forms/[place]

## 2023-11-20 NOTE — Progress Notes (Signed)
 Order placed. Please continue to try and reach patient.

## 2023-11-20 NOTE — Progress Notes (Signed)
 Patient returns to clinic for Rocephin  shot.  Doxy/7 day treatment rx by present provider in clinic since patient's partner was positive for Chlamydia.  All STI education given with medication warnings re: ETOH/Flagyl .  Patient verbalizes understanding

## 2023-11-20 NOTE — Progress Notes (Signed)
 Flagyl  sent to pharmacy on file. Will send note to pharmacy with script for pharmacy to alert the patient to contact us  for all of her results should she go to pick up the prescription.

## 2023-11-23 DIAGNOSIS — A515 Early syphilis, latent: Secondary | ICD-10-CM | POA: Diagnosis not present

## 2023-11-23 DIAGNOSIS — F4312 Post-traumatic stress disorder, chronic: Secondary | ICD-10-CM | POA: Diagnosis not present

## 2023-11-23 DIAGNOSIS — Z113 Encounter for screening for infections with a predominantly sexual mode of transmission: Secondary | ICD-10-CM | POA: Diagnosis not present

## 2023-11-23 DIAGNOSIS — Z114 Encounter for screening for human immunodeficiency virus [HIV]: Secondary | ICD-10-CM | POA: Diagnosis not present

## 2023-11-25 ENCOUNTER — Other Ambulatory Visit: Payer: Self-pay

## 2023-11-25 ENCOUNTER — Ambulatory Visit: Admitting: Obstetrics and Gynecology

## 2023-11-25 VITALS — BP 104/70 | HR 65 | Wt 222.1 lb

## 2023-11-25 DIAGNOSIS — N914 Secondary oligomenorrhea: Secondary | ICD-10-CM | POA: Diagnosis not present

## 2023-11-25 DIAGNOSIS — N979 Female infertility, unspecified: Secondary | ICD-10-CM

## 2023-11-25 DIAGNOSIS — Z1331 Encounter for screening for depression: Secondary | ICD-10-CM

## 2023-11-25 NOTE — Progress Notes (Signed)
    GYNECOLOGY VISIT  Patient name: Peggy Garza MRN [id number]  Date of birth: [birth year] Chief Complaint:   Gynecologic Exam  History:  Peggy Garza presents for follow up of AUB, previously seen for oligomenorrhea. Initial bleed felt like it wasn't enough and now having bleeding every other week. 9/29-10/3 with heavy flow with overnight flow and changing 2-3 times a day. Not bleeding now  Took provera  and estradiol  once and hasn't done it again and has been having menses/bleeding since. No ovulation kits with these Goal is pregnancy/conception.  No pain  Recently treated for gonorrhea and doxycyline because boyfriend treated for chlamydia.  [facility] because syphilis test came back positive but test was abnormal; had it done at the health department it was non-reactive but went ahead with bicillin treatment to be safe Had been bleeding every other week so thought it would come on Monday    The following portions of the patient's history were reviewed and updated as appropriate: allergies, current medications, past family history, past medical history, past social history, past surgical history and problem list.   Health Maintenance:   Last pap     Component Value Date/Time   DIAGPAP (A) 07/16/2023 0927    - Atypical squamous cells of undetermined significance (ASC-US )   HPVHIGH Negative 07/16/2023 0927   ADEQPAP  07/16/2023 0927    Satisfactory for evaluation; transformation zone component PRESENT.    Health Maintenance  Topic Date Due   Pneumococcal Vaccine (1 of 2 - PCV) 07/26/2017   COVID-19 Vaccine (2 - Moderna risk series) 07/10/2019   Flu Shot  09/11/2023   Pap Smear  07/16/2026   DTaP/Tdap/Td vaccine (8 - Td or Tdap) 03/27/2029   Hepatitis B Vaccine  Completed   HPV Vaccine  Completed   Hepatitis C Screening  Completed   HIV Screening  Completed   Meningitis B Vaccine  Aged Out      Review of Systems:  Pertinent items are noted in  HPI. Comprehensive review of systems was otherwise negative.   Objective:  Physical Exam BP 104/70   Pulse 65   Wt 222 lb 1.6 oz (100.7 kg)   LMP 11/09/2023   BMI 40.62 kg/m    Physical Exam Vitals and nursing note reviewed.  Constitutional:      Appearance: Normal appearance.  HENT:     Head: Normocephalic and atraumatic.  Pulmonary:     Effort: Pulmonary effort is normal.  Skin:    General: Skin is warm and dry.  Neurological:     General: No focal deficit present.     Mental Status: She is alert.  Psychiatric:        Mood and Affect: Mood normal.        Behavior: Behavior normal.        Thought Content: Thought content normal.        Judgment: Judgment normal.       Assessment & Plan:  1. Secondary oligomenorrhea (Primary) 2. Infertility, female Will re start provera  end of October/early November. If no withdrawal bleed with provera  alone, will do E2/provera  withdrawal  Ovulation kits at home Day 21 progesterone  to assess ovulation  If another prolonged episode of bleeding, repeat US  and likely have hysteroscopy for endometrial sampling    Carter Quarry, MD Minimally Invasive Gynecologic Surgery [facility]

## 2023-11-25 NOTE — Progress Notes (Deleted)
 Patient: Peggy Garza Date of Birth: 01-21-2001  Reason for Visit: Follow up History from: Patient Primary Neurologist: Ahern-migraines/Athar-OSA  ASSESSMENT AND PLAN 23 y.o. year old female   1.  Chronic migraine headaches  -She would like to try increasing Topamax  to help with migraines, continue weight loss.  On average 1 migraine every 2 weeks, has lost 30 pounds -Increase Topamax  to 100 mg at bedtime for migraine prevention.  I discussed that we do not recommend becoming pregnant on the medication as it can be teratogenic.  She is not in a relationship and not currently sexually active.  I will send in folic acid  1 mg daily.  Topamax  has been very helpful for her, she wishes to remain on this. -Continue Maxalt  10 mg as needed for severe headache  2.  Morning headache 3.  Daytime fatigue 4.  Obesity, BMI 48 5.  Mild OSA  -HST October 2024 showed overall mild OSA with total AHI 7.5/hour and O2 nadir 87%. Snoring was mild to moderate  -She will be diligent to try to use CPAP nightly for minimum 4 hours.  She continues with morning headache, daytime fatigue.  If she does not notice any change with CPAP, we may consider a repeat HST since she has lost 30 pounds at this point. Her data shows a good fitting mask, well controlled AHI 1.1. Encouraged her to follow up with my sooner about CPAP if any issues  -Follow up in 6 months with me  No orders of the defined types were placed in this encounter.  HISTORY OF PRESENT ILLNESS: Today 11/25/23 11/26/23 SS: New HST ordered, had to turn in her CPAP due to noncompliance.   05/05/23 SS: CPAP report 03/06/2023-05/04/23 shows 63% usage, greater than 4 hours 53%.  Average usage days used 4 hours 59 minutes. 6-11 centimeters water.  EPR 2.  Leak 6.2, AHI 1.1.  We could not pull a download through [employer], had to call and have it faxed over. Trying to use CPAP, it wakes her up at night, not sure why? Using FFM. Goes to bed around midnight, waking  up 2-3 times at night due to CPAP, wakes up around 8: 30 AM. Continues with morning headache, but not as often. Still tired during the day. Is now on Topamax  50 mg daily for migraine, no longer having 3 migraines a week, now 1 every 2 weeks. Has not needed the Maxalt , able to continue with activity. Has lost about 30 lbs, Topamax  has helped her lose weight.  Very pleased with this.  No longer planning for pregnancy, is not in a relationship or sexually active.  Update 02/18/23 SS: HST 11/11/2022 showed overall mild OSA with total AHI of 7.5/hour and O2 nadir of 87%.  Snoring was mild to moderate. Has not started her CPAP yet. Continues with morning headache, daytime fatigue, snoring.  Is no longer trying to get  pregnant, does not have a partner. 3 migraines a week, 4-5 moderate headaches weekly. Takes OTC Tylenol  or Motrin for relief. Has not needed the Maxalt . Migraines are frontal, right sided, with migraine features. Not currently on anything for weight loss.  Would like to go back on Topamax  for migraine preventative.   Update 10/16/22 SS: Saw Dr. Ines September 2022 for headaches.  Referred for sleep consult. Given Maxalt  and Topamax .  MRI of the brain with and without contrast was normal October 2022.  Sleep consult January 2023 with Dr. Buck. She never did the sleep study, didn't get around  to getting it. Waking up with headache, snoring is less, suboptimal energy level, non-restorative sleep. 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 Waking up in the morning with headache, right frontal, behind right eye. Will take Tylenol  or Motrin with good benefit. Also had migraines, once a week, generalized, pain is severe, sensitive to light or sound, no vomiting. Takes BC powder, it helps. Has gained 30 lbs since last seen, BMI 48. Would like to restart Topamax , Maxalt . Is trying to get pregnant currently, no children currently. ESS 8.  HISTORY  10/23/20 Dr. Ines: HPI:  Peggy Garza is a 23 y.o.  female here as requested by Darene Josette POUR, NP for headaches.   She wakes up with headaches and that is when they are the worst. She wakes up with them. She may try OTC from time to time but most times no medications. Ongoing for several years. She has gained weight. Mother and grandmother have sleep apnea. Mother has migraines and had a tumor on the brain that is being monitored. She has headache most of the day. They are all over the head. She also has another headache that is more migrainous, pulsating/pounding/throbbing, light and sound sensitivity, mild nausea, a dark room room makes it better, unknown triggers. Daily headaches. Worse in the morning and when laying down. She has migraines 8-12 days a month. No vision changes, no aura, no medication overuse but when she gets the headaches the vision can change to blurriness, does not wake her up but wakes up with headaches. Never feels refreshed. Takes naps throughout the day.No other focal neurologic deficits, associated symptoms, inciting events or modifiable factors.   Reviewed notes, labs and imaging from outside physicians, which showed:   Meds tried: Amitriptyline/nortriptyline, propranol is contraindicated due to low blood presure.    CMP unremarkable 08/23/2020 Hgba1c 5.9  REVIEW OF SYSTEMS: Out of a complete 14 system review of symptoms, the patient complains only of the following symptoms, and all other reviewed systems are negative.  See HPI  ALLERGIES: Allergies  Allergen Reactions   Victoza  [Liraglutide ] Other (See Comments)    Patient notes side effects of sore throat and hoarseness, however nurse documentation reports that the patient stated that her throat was closing.  Patient never sought emergency care and never had problems breathing.    HOME MEDICATIONS: Outpatient Medications Prior to Visit  Medication Sig Dispense Refill   acyclovir (ZOVIRAX) 200 MG/5ML suspension Take 800 mg by mouth 3 (three) times daily as  needed (Outbreak).     albuterol  (VENTOLIN  HFA) 108 (90 Base) MCG/ACT inhaler Inhale 2 puffs into the lungs every 6 (six) hours as needed for wheezing or shortness of breath (Cough). 18 g 0   cetirizine  (ZYRTEC ) 10 MG tablet Take 1 tablet (10 mg total) by mouth daily. 30 tablet 11   Cholecalciferol (VITAMIN D3) 50 MCG (2000 UT) capsule Take 1 capsule (2,000 Units total) by mouth daily. 90 capsule 1   estradiol  (ESTRACE ) 1 MG tablet Take 1 tablet (1 mg total) by mouth daily. 35 tablet 2   folic acid  (FOLVITE ) 1 MG tablet Take 1 tablet (1 mg total) by mouth daily. 30 tablet 5   hydrochlorothiazide  (MICROZIDE ) 12.5 MG capsule Take 1 capsule (12.5 mg total) by mouth daily. 30 capsule 1   medroxyPROGESTERone  (PROVERA ) 10 MG tablet Take 1 tablet (10 mg total) by mouth daily. Use for ten days (Patient not taking: Reported on 09/08/2023) 10 tablet 2   meloxicam  (MOBIC ) 15  MG tablet Take 1 tablet (15 mg total) by mouth daily. 30 tablet 0   semaglutide -weight management (WEGOVY ) 0.5 MG/0.5ML SOAJ SQ injection Inject 0.5 mg into the skin once a week. 2 mL 0   Semaglutide -Weight Management 0.25 MG/0.5ML SOAJ Inject 0.25 mg into the skin once a week. 2 mL 0   topiramate  (TOPAMAX ) 100 MG tablet Take 1 tablet (100 mg total) by mouth at bedtime. 30 tablet 5   Vitamin D , Ergocalciferol , (DRISDOL ) 1.25 MG (50000 UNIT) CAPS capsule Take 1 capsule (50,000 Units total) by mouth every 7 (seven) days. 8 capsule 0   No facility-administered medications prior to visit.    PAST MEDICAL HISTORY: Past Medical History:  Diagnosis Date   Allergy    Victoza    Anemia    Asthma    Genital herpes    GERD (gastroesophageal reflux disease)    Headache    03/25/22- has not had in a while   Other fatigue    PCOS (polycystic ovarian syndrome)    Pre-diabetes    Prediabetes    Shortness of breath on exertion    Sleep apnea     PAST SURGICAL HISTORY: Past Surgical History:  Procedure Laterality Date   BREAST CYST  EXCISION Bilateral 03/27/2022   Procedure: EXCISION OF BILATERAL BREAST CHRONIC ABSCESS;  Surgeon: Aron Shoulders, MD;  Location: [facility] OR;  Service: General;  Laterality: Bilateral;   TONSILLECTOMY      FAMILY HISTORY: Family History  Problem Relation Age of Onset   Hypertension Mother    Thyroid  disease Mother    Sleep apnea Mother    Obesity Mother    Migraines Mother    Obesity Father    Sleep apnea Maternal Grandmother    Asthma Maternal Grandmother    Cancer Maternal Grandmother    Asthma Paternal Grandmother    Diabetes Paternal Grandmother     SOCIAL HISTORY: Social History   Socioeconomic History   Marital status: Single    Spouse name: Not on file   Number of children: Not on file   Years of education: Not on file   Highest education level: Bachelor's degree (e.g., BA, AB, BS)  Occupational History   Occupation: Public relations account executive  Tobacco Use   Smoking status: Never   Smokeless tobacco: Never  Vaping Use   Vaping status: Never Used  Substance and Sexual Activity   Alcohol use: Yes    Comment: occasionally   Drug use: Not Currently    Types: Marijuana   Sexual activity: Yes    Birth control/protection: None  Other Topics Concern   Not on file  Social History Narrative   Not on file   Social Drivers of Health   Financial Resource Strain: Low Risk  (09/02/2023)   Overall Financial Resource Strain (CARDIA)    Difficulty of Paying Living Expenses: Not hard at all  Food Insecurity: No Food Insecurity (09/08/2023)   Hunger Vital Sign    Worried About Running Out of Food in the Last Year: Never true    Ran Out of Food in the Last Year: Never true  Transportation Needs: No Transportation Needs (09/02/2023)   PRAPARE - Administrator, Civil Service (Medical): No    Lack of Transportation (Non-Medical): No  Physical Activity: Insufficiently Active (09/02/2023)   Exercise Vital Sign    Days of Exercise per Week: 3 days    Minutes of Exercise per  Session: 30 min  Stress: No Stress Concern Present (09/02/2023)  [employer] of Occupational Health - Occupational Stress Questionnaire    Feeling of Stress: Not at all  Social Connections: Moderately Isolated (09/02/2023)   Social Connection and Isolation Panel    Frequency of Communication with Friends and Family: More than three times a week    Frequency of Social Gatherings with Friends and Family: Twice a week    Attends Religious Services: More than 4 times per year    Active Member of [employer] or Organizations: No    Attends Engineer, structural: Not on file    Marital Status: Never married  Intimate Partner Violence: Unknown (05/16/2021)   Received from [facility]   HITS    Physically Hurt: Not on file    Insult or Talk Down To: Not on file    Threaten Physical Harm: Not on file    Scream or Curse: Not on file   PHYSICAL EXAM  There were no vitals filed for this visit.  There is no height or weight on file to calculate BMI.  Generalized: Well developed, in no acute distress  Neurological examination  Mentation: Alert oriented to time, place, history taking. Follows all commands speech and language fluent Cranial nerve II-XII: Pupils were equal round reactive to light. Extraocular movements were full, visual field were full on confrontational test. Facial sensation and strength were normal.  Head turning and shoulder shrug  were normal and symmetric. Motor: The motor testing reveals 5 over 5 strength of all 4 extremities. Good symmetric motor tone is noted throughout.  Sensory: Sensory testing is intact to soft touch on all 4 extremities. No evidence of extinction is noted.  Coordination: Cerebellar testing reveals good finger-nose-finger and heel-to-shin bilaterally.  Gait and station: Gait is normal.  Reflexes: Deep tendon reflexes are symmetric and normal bilaterally.   DIAGNOSTIC DATA (LABS, IMAGING, TESTING) - I reviewed patient records, labs, notes,  testing and imaging myself where available.  Lab Results  Component Value Date   WBC 6.1 05/22/2023   HGB 12.3 05/22/2023   HCT 38.0 05/22/2023   MCV 84.9 05/22/2023   PLT 237.0 05/22/2023      Component Value Date/Time   NA 139 09/03/2023 0912   NA 142 06/12/2021 0914   K 3.9 09/03/2023 0912   CL 110 09/03/2023 0912   CO2 23 09/03/2023 0912   GLUCOSE 84 09/03/2023 0912   BUN 9 09/03/2023 0912   BUN 8 06/12/2021 0914   CREATININE 0.93 09/03/2023 0912   CALCIUM 9.2 09/03/2023 0912   PROT 7.9 09/03/2023 0912   PROT 6.9 06/12/2021 0914   ALBUMIN 4.0 09/03/2023 0912   ALBUMIN 3.5 (L) 06/12/2021 0914   AST 13 09/03/2023 0912   ALT 10 09/03/2023 0912   ALKPHOS 52 09/03/2023 0912   BILITOT 0.2 09/03/2023 0912   BILITOT <0.2 06/12/2021 0914   GFRNONAA 107 03/06/2020 1012   GFRAA 124 03/06/2020 1012   Lab Results  Component Value Date   CHOL 148 04/03/2023   HDL 40.50 04/03/2023   LDLCALC 77 04/03/2023   TRIG 152.0 (H) 04/03/2023   CHOLHDL 4 04/03/2023   Lab Results  Component Value Date   HGBA1C 5.8 (H) 07/16/2023   Lab Results  Component Value Date   VITAMINB12 262 03/06/2020   Lab Results  Component Value Date   TSH 0.84 04/03/2023    Lauraine Born, AGNP-C, DNP 11/25/2023, 2:35 PM [facility] [address], [address] [place], [place] [address] [phone]

## 2023-11-25 NOTE — Patient Instructions (Signed)
 If your cycle does not start on its own, take provera  to help bring it on.   If you have another

## 2023-11-26 ENCOUNTER — Encounter: Payer: Self-pay | Admitting: Neurology

## 2023-11-26 ENCOUNTER — Ambulatory Visit: Admitting: Neurology

## 2023-12-04 ENCOUNTER — Other Ambulatory Visit

## 2023-12-14 DIAGNOSIS — F4312 Post-traumatic stress disorder, chronic: Secondary | ICD-10-CM | POA: Diagnosis not present

## 2023-12-21 ENCOUNTER — Telehealth: Payer: Self-pay | Admitting: Plastic Surgery

## 2023-12-21 DIAGNOSIS — F4312 Post-traumatic stress disorder, chronic: Secondary | ICD-10-CM | POA: Diagnosis not present

## 2023-12-21 NOTE — Telephone Encounter (Signed)
 BILATERAL BREAST REDUCTION///INS//SX/01-12-24/730AM   Plz check for sx clearances

## 2023-12-22 NOTE — Progress Notes (Signed)
 Patient ID: Peggy Garza, female    DOB: Jan 14, 2001, 23 y.o.   MRN: [id number]  Chief Complaint  Patient presents with   Pre-op Exam      ICD-10-CM   1. Macromastia  N62        History of Present Illness: Peggy Garza is a 23 y.o.  female  with a history of macromastia.  She presents for preoperative evaluation for upcoming procedure, bilateral breast reduction, scheduled for 01/12/2024 with Dr. Waddell.  The patient has not had problems with anesthesia.  No previous complication with prior breast surgery.  She denies any personal or family history of blood clots or clotting disorder.  Denies any personal history of cardiac or pulmonary disease, cancer, nicotine use, varicosities, or inflammatory bowel disease.  Discussed the slightly increased risk of DVT when on oral estrogen perioperatively. She reports that she does not take it regularly and will continue to hold anticipation of surgery.  Discussed risks of surgery including but not limited to nipple areolar necrosis or free nipple graft and she is agreeable to proceed.  Summary of Previous Visit: She was seen for initial consult 09/07/2023.  At that time, she had lost a considerable amount of weight and BMI now measured 41 kg/m.  STN 37 cm on the right, 39 cm in the left.  Estimated tissue to be removed at time of surgery equals 500 g in the right breast, 700 g in the left breast.  Discussed risks of surgery and she was understanding agreeable to proceed.  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  She plans to take 1 week off from work.  This is reasonable.  PMH Significant for: Macromastia, obesity no longer on Wegovy , MDD, PCOS, genital herpes, migraine disorder, excision of likely infected sebaceous cysts of bilateral breasts 03/2020 and subsequent excision of chronic bilateral breast abscesses 03/27/2022 with Dr. Aron.   Past Medical History: Allergies: Allergies  Allergen Reactions   Victoza  [Liraglutide ] Other (See  Comments)    Patient notes side effects of sore throat and hoarseness, however nurse documentation reports that the patient stated that her throat was closing.  Patient never sought emergency care and never had problems breathing.    Current Medications:  Current Outpatient Medications:    acyclovir (ZOVIRAX) 200 MG/5ML suspension, Take 800 mg by mouth 3 (three) times daily as needed (Outbreak)., Disp: , Rfl:    albuterol  (VENTOLIN  HFA) 108 (90 Base) MCG/ACT inhaler, Inhale 2 puffs into the lungs every 6 (six) hours as needed for wheezing or shortness of breath (Cough)., Disp: 18 g, Rfl: 0   cetirizine  (ZYRTEC ) 10 MG tablet, Take 1 tablet (10 mg total) by mouth daily., Disp: 30 tablet, Rfl: 11   meloxicam  (MOBIC ) 15 MG tablet, Take 1 tablet (15 mg total) by mouth daily., Disp: 30 tablet, Rfl: 0   ondansetron  (ZOFRAN -ODT) 4 MG disintegrating tablet, Take 1 tablet (4 mg total) by mouth every 8 (eight) hours as needed for nausea or vomiting., Disp: 20 tablet, Rfl: 0   oxyCODONE  (ROXICODONE ) 5 MG immediate release tablet, Take 1 tablet (5 mg total) by mouth every 8 (eight) hours as needed for up to 5 days for severe pain (pain score 7-10)., Disp: 15 tablet, Rfl: 0   topiramate  (TOPAMAX ) 100 MG tablet, Take 1 tablet (100 mg total) by mouth at bedtime., Disp: 30 tablet, Rfl: 5   Cholecalciferol (VITAMIN D3) 50 MCG (2000 UT) capsule, Take 1 capsule (2,000 Units total) by mouth daily., Disp: 90 capsule,  Rfl: 1   estradiol  (ESTRACE ) 1 MG tablet, Take 1 tablet (1 mg total) by mouth daily., Disp: 35 tablet, Rfl: 2   folic acid  (FOLVITE ) 1 MG tablet, Take 1 tablet (1 mg total) by mouth daily., Disp: 30 tablet, Rfl: 5   hydrochlorothiazide  (MICROZIDE ) 12.5 MG capsule, Take 1 capsule (12.5 mg total) by mouth daily., Disp: 30 capsule, Rfl: 1   medroxyPROGESTERone  (PROVERA ) 10 MG tablet, Take 1 tablet (10 mg total) by mouth daily. Use for ten days (Patient not taking: Reported on 09/08/2023), Disp: 10 tablet, Rfl:  2   semaglutide -weight management (WEGOVY ) 0.5 MG/0.5ML SOAJ SQ injection, Inject 0.5 mg into the skin once a week., Disp: 2 mL, Rfl: 0   Semaglutide -Weight Management 0.25 MG/0.5ML SOAJ, Inject 0.25 mg into the skin once a week., Disp: 2 mL, Rfl: 0   Vitamin D , Ergocalciferol , (DRISDOL ) 1.25 MG (50000 UNIT) CAPS capsule, Take 1 capsule (50,000 Units total) by mouth every 7 (seven) days., Disp: 8 capsule, Rfl: 0  Past Medical Problems: Past Medical History:  Diagnosis Date   Allergy    Victoza    Anemia    Asthma    Genital herpes    GERD (gastroesophageal reflux disease)    Headache    03/25/22- has not had in a while   Other fatigue    PCOS (polycystic ovarian syndrome)    Pre-diabetes    Prediabetes    Shortness of breath on exertion    Sleep apnea     Past Surgical History: Past Surgical History:  Procedure Laterality Date   BREAST CYST EXCISION Bilateral 03/27/2022   Procedure: EXCISION OF BILATERAL BREAST CHRONIC ABSCESS;  Surgeon: Aron Shoulders, MD;  Location: [facility] OR;  Service: General;  Laterality: Bilateral;   TONSILLECTOMY      Social History: Social History   Socioeconomic History   Marital status: Single    Spouse name: Not on file   Number of children: Not on file   Years of education: Not on file   Highest education level: Bachelor's degree (e.g., BA, AB, BS)  Occupational History   Occupation: public relations account executive  Tobacco Use   Smoking status: Never   Smokeless tobacco: Never  Vaping Use   Vaping status: Never Used  Substance and Sexual Activity   Alcohol use: Yes    Comment: occasionally   Drug use: Not Currently    Types: Marijuana   Sexual activity: Yes    Birth control/protection: None  Other Topics Concern   Not on file  Social History Narrative   Not on file   Social Drivers of Health   Financial Resource Strain: Low Risk  (09/02/2023)   Overall Financial Resource Strain (CARDIA)    Difficulty of Paying Living Expenses: Not hard at all   Food Insecurity: Food Insecurity Present (11/25/2023)   Hunger Vital Sign    Worried About Running Out of Food in the Last Year: Sometimes true    Ran Out of Food in the Last Year: Sometimes true  Transportation Needs: No Transportation Needs (11/25/2023)   PRAPARE - Administrator, Civil Service (Medical): No    Lack of Transportation (Non-Medical): No  Physical Activity: Insufficiently Active (09/02/2023)   Exercise Vital Sign    Days of Exercise per Week: 3 days    Minutes of Exercise per Session: 30 min  Stress: No Stress Concern Present (09/02/2023)   [employer] of Occupational Health - Occupational Stress Questionnaire    Feeling of Stress: Not at  all  Social Connections: Moderately Isolated (09/02/2023)   Social Connection and Isolation Panel    Frequency of Communication with Friends and Family: More than three times a week    Frequency of Social Gatherings with Friends and Family: Twice a week    Attends Religious Services: More than 4 times per year    Active Member of [employer] or Organizations: No    Attends Engineer, Structural: Not on file    Marital Status: Never married  Intimate Partner Violence: Unknown (05/16/2021)   Received from [facility]   HITS    Physically Hurt: Not on file    Insult or Talk Down To: Not on file    Threaten Physical Harm: Not on file    Scream or Curse: Not on file    Family History: Family History  Problem Relation Age of Onset   Hypertension Mother    Thyroid  disease Mother    Sleep apnea Mother    Obesity Mother    Migraines Mother    Obesity Father    Sleep apnea Maternal Grandmother    Asthma Maternal Grandmother    Cancer Maternal Grandmother    Asthma Paternal Grandmother    Diabetes Paternal Grandmother     Review of Systems: ROS Denies any recent infection or trauma.  Physical Exam: Vital Signs BP 109/70 (BP Location: Left Arm, Patient Position: Sitting, Cuff Size: Large)   Pulse 65   Ht  5' 2 (1.575 m)   Wt 218 lb (98.9 kg)   LMP 11/09/2023   SpO2 100%   BMI 39.87 kg/m   Physical Exam Constitutional:      General: Not in acute distress.    Appearance: Normal appearance. Not ill-appearing.  HENT:     Head: Normocephalic and atraumatic.  Eyes:     Pupils: Pupils are equal, round. Cardiovascular:     Rate and Rhythm: Normal rate.    Pulses: Normal pulses.  Pulmonary:     Effort: No respiratory distress or increased work of breathing.  Speaks in full sentences. Abdominal:     General: Abdomen is flat. No distension.   Musculoskeletal: Normal range of motion. No lower extremity swelling or edema. Skin:    General: Skin is warm and dry.     Findings: No erythema or rash.  Neurological:     Mental Status: Alert and oriented to person, place, and time.  Psychiatric:        Mood and Affect: Mood normal.        Behavior: Behavior normal.    Assessment/Plan: The patient is scheduled for bilateral breast reduction with Dr. Waddell.  Risks, benefits, and alternatives of procedure discussed, questions answered and consent obtained.    Smoking Status: She denies use of any nicotine containing products.  Last Mammogram: N/A due to age, but previous imaging for her bilateral breast cysts.  Caprini Score: 3; Risk Factors include: BMI greater than 25 and length of planned surgery. Recommendation for mechanical prophylaxis. Encourage early ambulation.   Pictures obtained: 09/07/2023  Post-op Rx sent to pharmacy: Zofran  and oxycodone .  Patient was provided with the General Surgical Risk consent document and Pain Medication Agreement prior to their appointment.  They had adequate time to read through the risk consent documents and Pain Medication Agreement. We also discussed them in person together during this preop appointment. All of their questions were answered to their satisfaction.  Recommended calling if they have any further questions.  Risk consent form and Pain  Medication Agreement to be scanned into patient's chart.  The risk that can be encountered with breast reduction were discussed and include the following but not limited to these:  Breast asymmetry, fluid accumulation, firmness of the breast, inability to breast feed, loss of nipple or areola, skin loss, decrease or no nipple sensation, fat necrosis of the breast tissue, bleeding, infection, healing delay.  There are risks of anesthesia, changes to skin sensation and injury to nerves or blood vessels.  The muscle can be temporarily or permanently injured.  You may have an allergic reaction to tape, suture, glue, blood products which can result in skin discoloration, swelling, pain, skin lesions, poor healing.  Any of these can lead to the need for revisonal surgery or stage procedures.  A reduction has potential to interfere with diagnostic procedures.  Nipple or breast piercing can increase risks of infection.  This procedure is best done when the breast is fully developed.  Changes in the breast will continue to occur over time.  Pregnancy can alter the outcomes of previous breast reduction surgery, weight gain and weigh loss can also effect the long term appearance.   We discussed the possibility of amputation/free nipple graft technique due to the length of her STN.  She is understanding of the possibility that we would need to transition from a pedicle technique to a free nipple graft technique intraoperatively.  We discussed the risks associated with free nipple graft breast reductions, including but not limited to failure of the graft, partial loss of the graft, loss of sensation of bilateral nipple areola, complete loss of the nipple areola graft, inability to breast-feed, postoperative wounds, ongoing wound care.  We also discussed the risks associated with the pedicle technique.  We discussed that with the pedicle technique she could develop nipple areolar necrosis which would result in loss of the  nipple, this would also result in ongoing wound care and possible changes in the shape of her breast.      Electronically signed by: Honora Seip, PA-C 12/23/2023 9:30 AM

## 2023-12-22 NOTE — H&P (View-Only) (Signed)
 Patient ID: Peggy Garza, female    DOB: Jan 14, 2001, 23 y.o.   MRN: [id number]  Chief Complaint  Patient presents with   Pre-op Exam      ICD-10-CM   1. Macromastia  N62        History of Present Illness: Peggy Garza is a 23 y.o.  female  with a history of macromastia.  She presents for preoperative evaluation for upcoming procedure, bilateral breast reduction, scheduled for 01/12/2024 with Dr. Waddell.  The patient has not had problems with anesthesia.  No previous complication with prior breast surgery.  She denies any personal or family history of blood clots or clotting disorder.  Denies any personal history of cardiac or pulmonary disease, cancer, nicotine use, varicosities, or inflammatory bowel disease.  Discussed the slightly increased risk of DVT when on oral estrogen perioperatively. She reports that she does not take it regularly and will continue to hold anticipation of surgery.  Discussed risks of surgery including but not limited to nipple areolar necrosis or free nipple graft and she is agreeable to proceed.  Summary of Previous Visit: She was seen for initial consult 09/07/2023.  At that time, she had lost a considerable amount of weight and BMI now measured 41 kg/m.  STN 37 cm on the right, 39 cm in the left.  Estimated tissue to be removed at time of surgery equals 500 g in the right breast, 700 g in the left breast.  Discussed risks of surgery and she was understanding agreeable to proceed.  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  She plans to take 1 week off from work.  This is reasonable.  PMH Significant for: Macromastia, obesity no longer on Wegovy , MDD, PCOS, genital herpes, migraine disorder, excision of likely infected sebaceous cysts of bilateral breasts 03/2020 and subsequent excision of chronic bilateral breast abscesses 03/27/2022 with Dr. Aron.   Past Medical History: Allergies: Allergies  Allergen Reactions   Victoza  [Liraglutide ] Other (See  Comments)    Patient notes side effects of sore throat and hoarseness, however nurse documentation reports that the patient stated that her throat was closing.  Patient never sought emergency care and never had problems breathing.    Current Medications:  Current Outpatient Medications:    acyclovir (ZOVIRAX) 200 MG/5ML suspension, Take 800 mg by mouth 3 (three) times daily as needed (Outbreak)., Disp: , Rfl:    albuterol  (VENTOLIN  HFA) 108 (90 Base) MCG/ACT inhaler, Inhale 2 puffs into the lungs every 6 (six) hours as needed for wheezing or shortness of breath (Cough)., Disp: 18 g, Rfl: 0   cetirizine  (ZYRTEC ) 10 MG tablet, Take 1 tablet (10 mg total) by mouth daily., Disp: 30 tablet, Rfl: 11   meloxicam  (MOBIC ) 15 MG tablet, Take 1 tablet (15 mg total) by mouth daily., Disp: 30 tablet, Rfl: 0   ondansetron  (ZOFRAN -ODT) 4 MG disintegrating tablet, Take 1 tablet (4 mg total) by mouth every 8 (eight) hours as needed for nausea or vomiting., Disp: 20 tablet, Rfl: 0   oxyCODONE  (ROXICODONE ) 5 MG immediate release tablet, Take 1 tablet (5 mg total) by mouth every 8 (eight) hours as needed for up to 5 days for severe pain (pain score 7-10)., Disp: 15 tablet, Rfl: 0   topiramate  (TOPAMAX ) 100 MG tablet, Take 1 tablet (100 mg total) by mouth at bedtime., Disp: 30 tablet, Rfl: 5   Cholecalciferol (VITAMIN D3) 50 MCG (2000 UT) capsule, Take 1 capsule (2,000 Units total) by mouth daily., Disp: 90 capsule,  Rfl: 1   estradiol  (ESTRACE ) 1 MG tablet, Take 1 tablet (1 mg total) by mouth daily., Disp: 35 tablet, Rfl: 2   folic acid  (FOLVITE ) 1 MG tablet, Take 1 tablet (1 mg total) by mouth daily., Disp: 30 tablet, Rfl: 5   hydrochlorothiazide  (MICROZIDE ) 12.5 MG capsule, Take 1 capsule (12.5 mg total) by mouth daily., Disp: 30 capsule, Rfl: 1   medroxyPROGESTERone  (PROVERA ) 10 MG tablet, Take 1 tablet (10 mg total) by mouth daily. Use for ten days (Patient not taking: Reported on 09/08/2023), Disp: 10 tablet, Rfl:  2   semaglutide -weight management (WEGOVY ) 0.5 MG/0.5ML SOAJ SQ injection, Inject 0.5 mg into the skin once a week., Disp: 2 mL, Rfl: 0   Semaglutide -Weight Management 0.25 MG/0.5ML SOAJ, Inject 0.25 mg into the skin once a week., Disp: 2 mL, Rfl: 0   Vitamin D , Ergocalciferol , (DRISDOL ) 1.25 MG (50000 UNIT) CAPS capsule, Take 1 capsule (50,000 Units total) by mouth every 7 (seven) days., Disp: 8 capsule, Rfl: 0  Past Medical Problems: Past Medical History:  Diagnosis Date   Allergy    Victoza    Anemia    Asthma    Genital herpes    GERD (gastroesophageal reflux disease)    Headache    03/25/22- has not had in a while   Other fatigue    PCOS (polycystic ovarian syndrome)    Pre-diabetes    Prediabetes    Shortness of breath on exertion    Sleep apnea     Past Surgical History: Past Surgical History:  Procedure Laterality Date   BREAST CYST EXCISION Bilateral 03/27/2022   Procedure: EXCISION OF BILATERAL BREAST CHRONIC ABSCESS;  Surgeon: Aron Shoulders, MD;  Location: [facility] OR;  Service: General;  Laterality: Bilateral;   TONSILLECTOMY      Social History: Social History   Socioeconomic History   Marital status: Single    Spouse name: Not on file   Number of children: Not on file   Years of education: Not on file   Highest education level: Bachelor's degree (e.g., BA, AB, BS)  Occupational History   Occupation: public relations account executive  Tobacco Use   Smoking status: Never   Smokeless tobacco: Never  Vaping Use   Vaping status: Never Used  Substance and Sexual Activity   Alcohol use: Yes    Comment: occasionally   Drug use: Not Currently    Types: Marijuana   Sexual activity: Yes    Birth control/protection: None  Other Topics Concern   Not on file  Social History Narrative   Not on file   Social Drivers of Health   Financial Resource Strain: Low Risk  (09/02/2023)   Overall Financial Resource Strain (CARDIA)    Difficulty of Paying Living Expenses: Not hard at all   Food Insecurity: Food Insecurity Present (11/25/2023)   Hunger Vital Sign    Worried About Running Out of Food in the Last Year: Sometimes true    Ran Out of Food in the Last Year: Sometimes true  Transportation Needs: No Transportation Needs (11/25/2023)   PRAPARE - Administrator, Civil Service (Medical): No    Lack of Transportation (Non-Medical): No  Physical Activity: Insufficiently Active (09/02/2023)   Exercise Vital Sign    Days of Exercise per Week: 3 days    Minutes of Exercise per Session: 30 min  Stress: No Stress Concern Present (09/02/2023)   [employer] of Occupational Health - Occupational Stress Questionnaire    Feeling of Stress: Not at  all  Social Connections: Moderately Isolated (09/02/2023)   Social Connection and Isolation Panel    Frequency of Communication with Friends and Family: More than three times a week    Frequency of Social Gatherings with Friends and Family: Twice a week    Attends Religious Services: More than 4 times per year    Active Member of [employer] or Organizations: No    Attends Engineer, Structural: Not on file    Marital Status: Never married  Intimate Partner Violence: Unknown (05/16/2021)   Received from [facility]   HITS    Physically Hurt: Not on file    Insult or Talk Down To: Not on file    Threaten Physical Harm: Not on file    Scream or Curse: Not on file    Family History: Family History  Problem Relation Age of Onset   Hypertension Mother    Thyroid  disease Mother    Sleep apnea Mother    Obesity Mother    Migraines Mother    Obesity Father    Sleep apnea Maternal Grandmother    Asthma Maternal Grandmother    Cancer Maternal Grandmother    Asthma Paternal Grandmother    Diabetes Paternal Grandmother     Review of Systems: ROS Denies any recent infection or trauma.  Physical Exam: Vital Signs BP 109/70 (BP Location: Left Arm, Patient Position: Sitting, Cuff Size: Large)   Pulse 65   Ht  5' 2 (1.575 m)   Wt 218 lb (98.9 kg)   LMP 11/09/2023   SpO2 100%   BMI 39.87 kg/m   Physical Exam Constitutional:      General: Not in acute distress.    Appearance: Normal appearance. Not ill-appearing.  HENT:     Head: Normocephalic and atraumatic.  Eyes:     Pupils: Pupils are equal, round. Cardiovascular:     Rate and Rhythm: Normal rate.    Pulses: Normal pulses.  Pulmonary:     Effort: No respiratory distress or increased work of breathing.  Speaks in full sentences. Abdominal:     General: Abdomen is flat. No distension.   Musculoskeletal: Normal range of motion. No lower extremity swelling or edema. Skin:    General: Skin is warm and dry.     Findings: No erythema or rash.  Neurological:     Mental Status: Alert and oriented to person, place, and time.  Psychiatric:        Mood and Affect: Mood normal.        Behavior: Behavior normal.    Assessment/Plan: The patient is scheduled for bilateral breast reduction with Dr. Waddell.  Risks, benefits, and alternatives of procedure discussed, questions answered and consent obtained.    Smoking Status: She denies use of any nicotine containing products.  Last Mammogram: N/A due to age, but previous imaging for her bilateral breast cysts.  Caprini Score: 3; Risk Factors include: BMI greater than 25 and length of planned surgery. Recommendation for mechanical prophylaxis. Encourage early ambulation.   Pictures obtained: 09/07/2023  Post-op Rx sent to pharmacy: Zofran  and oxycodone .  Patient was provided with the General Surgical Risk consent document and Pain Medication Agreement prior to their appointment.  They had adequate time to read through the risk consent documents and Pain Medication Agreement. We also discussed them in person together during this preop appointment. All of their questions were answered to their satisfaction.  Recommended calling if they have any further questions.  Risk consent form and Pain  Medication Agreement to be scanned into patient's chart.  The risk that can be encountered with breast reduction were discussed and include the following but not limited to these:  Breast asymmetry, fluid accumulation, firmness of the breast, inability to breast feed, loss of nipple or areola, skin loss, decrease or no nipple sensation, fat necrosis of the breast tissue, bleeding, infection, healing delay.  There are risks of anesthesia, changes to skin sensation and injury to nerves or blood vessels.  The muscle can be temporarily or permanently injured.  You may have an allergic reaction to tape, suture, glue, blood products which can result in skin discoloration, swelling, pain, skin lesions, poor healing.  Any of these can lead to the need for revisonal surgery or stage procedures.  A reduction has potential to interfere with diagnostic procedures.  Nipple or breast piercing can increase risks of infection.  This procedure is best done when the breast is fully developed.  Changes in the breast will continue to occur over time.  Pregnancy can alter the outcomes of previous breast reduction surgery, weight gain and weigh loss can also effect the long term appearance.   We discussed the possibility of amputation/free nipple graft technique due to the length of her STN.  She is understanding of the possibility that we would need to transition from a pedicle technique to a free nipple graft technique intraoperatively.  We discussed the risks associated with free nipple graft breast reductions, including but not limited to failure of the graft, partial loss of the graft, loss of sensation of bilateral nipple areola, complete loss of the nipple areola graft, inability to breast-feed, postoperative wounds, ongoing wound care.  We also discussed the risks associated with the pedicle technique.  We discussed that with the pedicle technique she could develop nipple areolar necrosis which would result in loss of the  nipple, this would also result in ongoing wound care and possible changes in the shape of her breast.      Electronically signed by: Honora Seip, PA-C 12/23/2023 9:30 AM

## 2023-12-22 NOTE — Telephone Encounter (Signed)
 Will address at tomorrow's pre-op appt.

## 2023-12-23 ENCOUNTER — Ambulatory Visit (INDEPENDENT_AMBULATORY_CARE_PROVIDER_SITE_OTHER): Admitting: Physician Assistant

## 2023-12-23 ENCOUNTER — Encounter: Payer: Self-pay | Admitting: Physician Assistant

## 2023-12-23 VITALS — BP 109/70 | HR 65 | Ht 62.0 in | Wt 218.0 lb

## 2023-12-23 DIAGNOSIS — N62 Hypertrophy of breast: Secondary | ICD-10-CM

## 2023-12-23 MED ORDER — OXYCODONE HCL 5 MG PO TABS: 5.0000 mg | ORAL_TABLET | Freq: Three times a day (TID) | ORAL | 0 refills | Status: AC | PRN

## 2023-12-23 MED ORDER — ONDANSETRON 4 MG PO TBDP: 4.0000 mg | ORAL_TABLET | Freq: Three times a day (TID) | ORAL | 0 refills | Status: AC | PRN

## 2023-12-28 DIAGNOSIS — Z113 Encounter for screening for infections with a predominantly sexual mode of transmission: Secondary | ICD-10-CM | POA: Diagnosis not present

## 2023-12-28 DIAGNOSIS — F4312 Post-traumatic stress disorder, chronic: Secondary | ICD-10-CM | POA: Diagnosis not present

## 2023-12-28 DIAGNOSIS — F411 Generalized anxiety disorder: Secondary | ICD-10-CM | POA: Diagnosis not present

## 2024-01-01 ENCOUNTER — Other Ambulatory Visit: Payer: Self-pay

## 2024-01-01 ENCOUNTER — Encounter (HOSPITAL_BASED_OUTPATIENT_CLINIC_OR_DEPARTMENT_OTHER): Payer: Self-pay | Admitting: Plastic Surgery

## 2024-01-04 DIAGNOSIS — F331 Major depressive disorder, recurrent, moderate: Secondary | ICD-10-CM | POA: Diagnosis not present

## 2024-01-04 DIAGNOSIS — F4312 Post-traumatic stress disorder, chronic: Secondary | ICD-10-CM | POA: Diagnosis not present

## 2024-01-04 DIAGNOSIS — F411 Generalized anxiety disorder: Secondary | ICD-10-CM | POA: Diagnosis not present

## 2024-01-05 ENCOUNTER — Encounter: Payer: Self-pay | Admitting: Obstetrics and Gynecology

## 2024-01-06 NOTE — Telephone Encounter (Signed)
 Additional Diflucan  sent to pharmacy/[facility]

## 2024-01-12 ENCOUNTER — Ambulatory Visit (HOSPITAL_BASED_OUTPATIENT_CLINIC_OR_DEPARTMENT_OTHER): Admitting: Anesthesiology

## 2024-01-12 ENCOUNTER — Ambulatory Visit (HOSPITAL_BASED_OUTPATIENT_CLINIC_OR_DEPARTMENT_OTHER)
Admission: RE | Admit: 2024-01-12 | Discharge: 2024-01-12 | Disposition: A | Discharge status: Home / Self Care | Attending: Plastic Surgery | Admitting: Plastic Surgery

## 2024-01-12 ENCOUNTER — Encounter (HOSPITAL_BASED_OUTPATIENT_CLINIC_OR_DEPARTMENT_OTHER)
Admission: RE | Disposition: A | Discharge status: Home / Self Care | Payer: Self-pay | Source: Home / Self Care | Attending: Plastic Surgery

## 2024-01-12 ENCOUNTER — Encounter (HOSPITAL_BASED_OUTPATIENT_CLINIC_OR_DEPARTMENT_OTHER): Payer: Self-pay | Admitting: Plastic Surgery

## 2024-01-12 DIAGNOSIS — M546 Pain in thoracic spine: Secondary | ICD-10-CM

## 2024-01-12 DIAGNOSIS — N62 Hypertrophy of breast: Secondary | ICD-10-CM

## 2024-01-12 DIAGNOSIS — M542 Cervicalgia: Secondary | ICD-10-CM | POA: Diagnosis not present

## 2024-01-12 HISTORY — PX: BREAST REDUCTION SURGERY: SHX8

## 2024-01-12 HISTORY — DX: Personal history of other specified conditions: Z87.898

## 2024-01-12 LAB — POCT PREGNANCY, URINE: Preg Test, Ur: NEGATIVE

## 2024-01-12 SURGERY — MAMMOPLASTY, REDUCTION
Anesthesia: General | Site: Breast | Laterality: Bilateral

## 2024-01-12 MED ORDER — CEFAZOLIN SODIUM-DEXTROSE 2-4 GM/100ML-% IV SOLN
2.0000 g | INTRAVENOUS | Status: AC
  Administered 2024-01-12: 2 g via INTRAVENOUS

## 2024-01-12 MED ORDER — 0.9 % SODIUM CHLORIDE (POUR BTL) OPTIME
TOPICAL | Status: DC | PRN
  Administered 2024-01-12: 2000 mL

## 2024-01-12 MED ORDER — FENTANYL CITRATE (PF) 100 MCG/2ML IJ SOLN
INTRAMUSCULAR | Status: AC
  Filled 2024-01-12: qty 2

## 2024-01-12 MED ORDER — BUPIVACAINE LIPOSOME 1.3 % IJ SUSP
INTRAMUSCULAR | Status: DC | PRN
  Administered 2024-01-12: 20 mL

## 2024-01-12 MED ORDER — EPHEDRINE 5 MG/ML INJ
INTRAVENOUS | Status: AC
  Filled 2024-01-12: qty 5

## 2024-01-12 MED ORDER — OXYCODONE HCL 5 MG/5ML PO SOLN: 5.0000 mg | Freq: Once | ORAL | Status: AC | PRN

## 2024-01-12 MED ORDER — FENTANYL CITRATE (PF) 100 MCG/2ML IJ SOLN
25.0000 ug | INTRAMUSCULAR | Status: DC | PRN
  Administered 2024-01-12 (×2): 25 ug via INTRAVENOUS

## 2024-01-12 MED ORDER — SCOPOLAMINE 1 MG/3DAYS TD PT72
MEDICATED_PATCH | TRANSDERMAL | Status: DC | PRN
  Administered 2024-01-12: 1 via TRANSDERMAL

## 2024-01-12 MED ORDER — MIDAZOLAM HCL 5 MG/5ML IJ SOLN
INTRAMUSCULAR | Status: DC | PRN
  Administered 2024-01-12: 2 mg via INTRAVENOUS

## 2024-01-12 MED ORDER — ATROPINE SULFATE (PF) 0.4 MG/ML IJ SOLN
INTRAMUSCULAR | Status: AC
  Filled 2024-01-12: qty 1

## 2024-01-12 MED ORDER — LIDOCAINE HCL (CARDIAC) PF 100 MG/5ML IV SOSY
PREFILLED_SYRINGE | INTRAVENOUS | Status: DC | PRN
  Administered 2024-01-12: 60 mg via INTRAVENOUS

## 2024-01-12 MED ORDER — SUGAMMADEX SODIUM 200 MG/2ML IV SOLN
INTRAVENOUS | Status: DC | PRN
  Administered 2024-01-12: 200 mg via INTRAVENOUS

## 2024-01-12 MED ORDER — LACTATED RINGERS IV SOLN: INTRAVENOUS | Status: DC | PRN

## 2024-01-12 MED ORDER — AMISULPRIDE (ANTIEMETIC) 5 MG/2ML IV SOLN: 10.0000 mg | Freq: Once | INTRAVENOUS | Status: DC | PRN

## 2024-01-12 MED ORDER — DIPHENHYDRAMINE HCL 50 MG/ML IJ SOLN
INTRAMUSCULAR | Status: AC
  Filled 2024-01-12: qty 1

## 2024-01-12 MED ORDER — DEXAMETHASONE SODIUM PHOSPHATE 4 MG/ML IJ SOLN
INTRAMUSCULAR | Status: DC | PRN
  Administered 2024-01-12: 5 mg via INTRAVENOUS

## 2024-01-12 MED ORDER — MIDAZOLAM HCL 2 MG/2ML IJ SOLN
INTRAMUSCULAR | Status: AC
  Filled 2024-01-12: qty 2

## 2024-01-12 MED ORDER — SUCCINYLCHOLINE CHLORIDE 200 MG/10ML IV SOSY
PREFILLED_SYRINGE | INTRAVENOUS | Status: AC
  Filled 2024-01-12: qty 10

## 2024-01-12 MED ORDER — DEXMEDETOMIDINE HCL IN NACL 80 MCG/20ML IV SOLN
INTRAVENOUS | Status: AC
  Filled 2024-01-12: qty 20

## 2024-01-12 MED ORDER — CHLORHEXIDINE GLUCONATE CLOTH 2 % EX PADS: 6.0000 | MEDICATED_PAD | Freq: Once | CUTANEOUS | Status: DC

## 2024-01-12 MED ORDER — EPHEDRINE SULFATE (PRESSORS) 25 MG/5ML IV SOSY
PREFILLED_SYRINGE | INTRAVENOUS | Status: DC | PRN
  Administered 2024-01-12: 10 mg via INTRAVENOUS

## 2024-01-12 MED ORDER — SODIUM CHLORIDE (PF) 0.9 % IJ SOLN
INTRAMUSCULAR | Status: DC | PRN
  Administered 2024-01-12: 50 mL via INTRAVENOUS

## 2024-01-12 MED ORDER — PROPOFOL 10 MG/ML IV BOLUS
INTRAVENOUS | Status: DC | PRN
  Administered 2024-01-12: 200 mg via INTRAVENOUS

## 2024-01-12 MED ORDER — SCOPOLAMINE 1 MG/3DAYS TD PT72: 1.0000 | MEDICATED_PATCH | TRANSDERMAL | Status: DC

## 2024-01-12 MED ORDER — ROCURONIUM BROMIDE 100 MG/10ML IV SOLN
INTRAVENOUS | Status: DC | PRN
  Administered 2024-01-12: 50 mg via INTRAVENOUS
  Administered 2024-01-12: 10 mg via INTRAVENOUS

## 2024-01-12 MED ORDER — BUPIVACAINE HCL (PF) 0.25 % IJ SOLN
INTRAMUSCULAR | Status: DC | PRN
  Administered 2024-01-12: 30 mL

## 2024-01-12 MED ORDER — ROCURONIUM BROMIDE 10 MG/ML (PF) SYRINGE
PREFILLED_SYRINGE | INTRAVENOUS | Status: AC
  Filled 2024-01-12: qty 10

## 2024-01-12 MED ORDER — DIPHENHYDRAMINE HCL 50 MG/ML IJ SOLN
INTRAMUSCULAR | Status: DC | PRN
  Administered 2024-01-12: 6.25 mg via INTRAVENOUS

## 2024-01-12 MED ORDER — ACETAMINOPHEN 500 MG PO TABS: 1000.0000 mg | ORAL_TABLET | Freq: Once | ORAL | Status: DC

## 2024-01-12 MED ORDER — HYDROMORPHONE HCL 1 MG/ML IJ SOLN
INTRAMUSCULAR | Status: AC
  Filled 2024-01-12: qty 0.5

## 2024-01-12 MED ORDER — DEXMEDETOMIDINE HCL IN NACL 80 MCG/20ML IV SOLN
INTRAVENOUS | Status: DC | PRN
  Administered 2024-01-12: 8 ug via INTRAVENOUS

## 2024-01-12 MED ORDER — OXYCODONE HCL 5 MG PO TABS
5.0000 mg | ORAL_TABLET | Freq: Once | ORAL | Status: AC | PRN
  Administered 2024-01-12: 5 mg via ORAL

## 2024-01-12 MED ORDER — CEFAZOLIN SODIUM-DEXTROSE 2-4 GM/100ML-% IV SOLN
INTRAVENOUS | Status: AC
  Filled 2024-01-12: qty 100

## 2024-01-12 MED ORDER — OXYCODONE HCL 5 MG PO TABS
ORAL_TABLET | ORAL | Status: AC
  Filled 2024-01-12: qty 1

## 2024-01-12 MED ORDER — ONDANSETRON HCL 4 MG/2ML IJ SOLN
INTRAMUSCULAR | Status: DC | PRN
  Administered 2024-01-12: 4 mg via INTRAVENOUS

## 2024-01-12 MED ORDER — HYDROMORPHONE HCL 1 MG/ML IJ SOLN
INTRAMUSCULAR | Status: DC | PRN
  Administered 2024-01-12: .5 mg via INTRAVENOUS

## 2024-01-12 MED ORDER — FENTANYL CITRATE (PF) 100 MCG/2ML IJ SOLN
INTRAMUSCULAR | Status: DC | PRN
  Administered 2024-01-12 (×4): 50 ug via INTRAVENOUS

## 2024-01-12 MED ORDER — SODIUM CHLORIDE 0.9 % IV SOLN
12.5000 mg | INTRAVENOUS | Status: DC | PRN
  Filled 2024-01-12: qty 0.5

## 2024-01-12 MED ORDER — ONDANSETRON HCL 4 MG/2ML IJ SOLN
INTRAMUSCULAR | Status: AC
  Filled 2024-01-12: qty 2

## 2024-01-12 MED ORDER — PHENYLEPHRINE 80 MCG/ML (10ML) SYRINGE FOR IV PUSH (FOR BLOOD PRESSURE SUPPORT)
PREFILLED_SYRINGE | INTRAVENOUS | Status: AC
  Filled 2024-01-12: qty 10

## 2024-01-12 MED ORDER — SUCCINYLCHOLINE CHLORIDE 200 MG/10ML IV SOSY
PREFILLED_SYRINGE | INTRAVENOUS | Status: DC | PRN
  Administered 2024-01-12: 120 mg via INTRAVENOUS

## 2024-01-12 MED ORDER — ACETAMINOPHEN 10 MG/ML IV SOLN
INTRAVENOUS | Status: DC | PRN
  Administered 2024-01-12: 1000 mg via INTRAVENOUS

## 2024-01-12 MED ORDER — LIDOCAINE 2% (20 MG/ML) 5 ML SYRINGE
INTRAMUSCULAR | Status: AC
  Filled 2024-01-12: qty 5

## 2024-01-12 SURGICAL SUPPLY — 57 items
BINDER BREAST 3XL (GAUZE/BANDAGES/DRESSINGS) IMPLANT
BINDER BREAST LRG (GAUZE/BANDAGES/DRESSINGS) IMPLANT
BINDER BREAST MEDIUM (GAUZE/BANDAGES/DRESSINGS) IMPLANT
BINDER BREAST XLRG (GAUZE/BANDAGES/DRESSINGS) IMPLANT
BINDER BREAST XXLRG (GAUZE/BANDAGES/DRESSINGS) IMPLANT
BIOPATCH RED 1 DISK 7.0 (GAUZE/BANDAGES/DRESSINGS) ×2 IMPLANT
BLADE HEX COATED 2.75 (ELECTRODE) IMPLANT
BLADE SURG 10 STRL SS (BLADE) ×6 IMPLANT
BLADE SURG 15 STRL LF DISP TIS (BLADE) ×1 IMPLANT
CANISTER SUCT 1200ML W/VALVE (MISCELLANEOUS) IMPLANT
COTTONBALL LRG STERILE PKG (GAUZE/BANDAGES/DRESSINGS) IMPLANT
DERMABOND ADVANCED .7 DNX12 (GAUZE/BANDAGES/DRESSINGS) ×4 IMPLANT
DRAIN CHANNEL 19F RND (DRAIN) ×2 IMPLANT
DRAPE IMP U-DRAPE 54X76 (DRAPES) IMPLANT
DRAPE UTILITY XL STRL (DRAPES) ×1 IMPLANT
DRSG TEGADERM 4X4.75 (GAUZE/BANDAGES/DRESSINGS) ×2 IMPLANT
ELECTRODE BLDE 4.0 EZ CLN MEGD (MISCELLANEOUS) ×1 IMPLANT
ELECTRODE REM PT RTRN 9FT ADLT (ELECTROSURGICAL) ×2 IMPLANT
EVACUATOR SILICONE 100CC (DRAIN) ×2 IMPLANT
GAUZE PAD ABD 8X10 STRL (GAUZE/BANDAGES/DRESSINGS) ×2 IMPLANT
GAUZE SPONGE 2X2 STRL 8-PLY (GAUZE/BANDAGES/DRESSINGS) ×2 IMPLANT
GAUZE XEROFORM 5X9 LF (GAUZE/BANDAGES/DRESSINGS) IMPLANT
GLOVE BIO SURGEON STRL SZ 6.5 (GLOVE) IMPLANT
GLOVE BIO SURGEON STRL SZ7.5 (GLOVE) IMPLANT
GLOVE BIO SURGEON STRL SZ8 (GLOVE) ×1 IMPLANT
GLOVE BIOGEL PI IND STRL 7.0 (GLOVE) IMPLANT
GLOVE BIOGEL PI IND STRL 8 (GLOVE) ×1 IMPLANT
GOWN STRL REUS W/ TWL LRG LVL3 (GOWN DISPOSABLE) ×1 IMPLANT
GOWN STRL REUS W/TWL XL LVL3 (GOWN DISPOSABLE) ×1 IMPLANT
HIBICLENS CHG 4% 4OZ BTL (MISCELLANEOUS) ×1 IMPLANT
MANIFOLD NEPTUNE II (INSTRUMENTS) ×1 IMPLANT
MARKER SKIN DUAL TIP RULER LAB (MISCELLANEOUS) ×1 IMPLANT
NDL HYPO 22X1.5 SAFETY MO (MISCELLANEOUS) ×2 IMPLANT
PACK BASIN DAY SURGERY FS (CUSTOM PROCEDURE TRAY) ×1 IMPLANT
PACK UNIVERSAL I (CUSTOM PROCEDURE TRAY) ×1 IMPLANT
PENCIL SMOKE EVACUATOR (MISCELLANEOUS) ×2 IMPLANT
PIN SAFETY STERILE (MISCELLANEOUS) ×1 IMPLANT
SLEEVE SCD COMPRESS KNEE MED (STOCKING) ×1 IMPLANT
SOLN 0.9% NACL POUR BTL 1000ML (IV SOLUTION) ×1 IMPLANT
SPONGE T-LAP 18X18 ~~LOC~~+RFID (SPONGE) ×3 IMPLANT
STAPLER SKIN PROX WIDE 3.9 (STAPLE) ×1 IMPLANT
STRIP CLOSURE SKIN 1/2X4 (GAUZE/BANDAGES/DRESSINGS) IMPLANT
SUT MNCRL AB 3-0 PS2 27 (SUTURE) ×4 IMPLANT
SUT MNCRL AB 4-0 PS2 18 (SUTURE) ×4 IMPLANT
SUT MON AB 2-0 CT1 36 (SUTURE) ×1 IMPLANT
SUT MON AB 5-0 PS2 18 (SUTURE) IMPLANT
SUT SILK 2 0 SH (SUTURE) ×2 IMPLANT
SUT VIC AB 3-0 SH 27X BRD (SUTURE) IMPLANT
SYR 20ML LL LF (SYRINGE) ×2 IMPLANT
SYR BULB IRRIG 60ML STRL (SYRINGE) IMPLANT
SYR CONTROL 10ML LL (SYRINGE) ×1 IMPLANT
TAPE MEASURE VINYL STERILE (MISCELLANEOUS) IMPLANT
TOWEL GREEN STERILE FF (TOWEL DISPOSABLE) ×2 IMPLANT
TRAY DSU PREP LF (CUSTOM PROCEDURE TRAY) ×1 IMPLANT
TUBE CONNECTING 20X1/4 (TUBING) ×1 IMPLANT
UNDERPAD 30X36 HEAVY ABSORB (UNDERPADS AND DIAPERS) ×2 IMPLANT
YANKAUER SUCT BULB TIP NO VENT (SUCTIONS) ×1 IMPLANT

## 2024-01-12 NOTE — Interval H&P Note (Signed)
 History and Physical Interval Note: No change in exam or indication for surgery All questions answered. Reminded patient of the importance of not smoking and avoiding secondhand smoke Marked for a bilateral breast reduction with her assistance Will proceed at her request  01/12/2024 7:14 AM  Peggy Garza  has presented today for surgery, with the diagnosis of HYPERTROPHY OF BILATERAL BREAST.  The various methods of treatment have been discussed with the patient and family. After consideration of risks, benefits and other options for treatment, the patient has consented to  Procedure(s): MAMMOPLASTY, REDUCTION (Bilateral) as a surgical intervention.  The patient's history has been reviewed, patient examined, no change in status, stable for surgery.  I have reviewed the patient's chart and labs.  Questions were answered to the patient's satisfaction.     Leonce KATHEE Birmingham

## 2024-01-12 NOTE — Anesthesia Preprocedure Evaluation (Addendum)
 Anesthesia Evaluation  Patient identified by MRN, date of birth, ID band Patient awake    Reviewed: Allergy & Precautions, NPO status , Patient's Chart, lab work & pertinent test results  History of Anesthesia Complications Negative for: history of anesthetic complications  Airway Mallampati: II  TM Distance: >3 FB Neck ROM: Full    Dental  (+) Dental Advisory Given, Teeth Intact   Pulmonary asthma , sleep apnea , Current Smoker and Patient abstained from smoking.   Pulmonary exam normal        Cardiovascular negative cardio ROS Normal cardiovascular exam     Neuro/Psych  Headaches PSYCHIATRIC DISORDERS Anxiety Depression       GI/Hepatic ,GERD  Controlled,,(+)     substance abuse  marijuana use  Endo/Other   Obesity Pre-DM   Renal/GU negative Renal ROS     Musculoskeletal negative musculoskeletal ROS (+)    Abdominal  (+) + obese  Peds  Hematology negative hematology ROS (+)   Anesthesia Other Findings HSV  Reproductive/Obstetrics  PCOS                               Anesthesia Physical Anesthesia Plan  ASA: 2  Anesthesia Plan: General   Post-op Pain Management: Toradol  IV (intra-op)* and Tylenol  PO (pre-op)*   Induction: Intravenous  PONV Risk Score and Plan: 2 and Treatment may vary due to age or medical condition, Ondansetron , Dexamethasone , Midazolam  and Scopolamine patch - Pre-op  Airway Management Planned: Oral ETT  Additional Equipment: None  Intra-op Plan:   Post-operative Plan: Extubation in OR  Informed Consent: I have reviewed the patients History and Physical, chart, labs and discussed the procedure including the risks, benefits and alternatives for the proposed anesthesia with the patient or authorized representative who has indicated his/her understanding and acceptance.     Dental advisory given  Plan Discussed with: CRNA and  Anesthesiologist  Anesthesia Plan Comments:          Anesthesia Quick Evaluation

## 2024-01-12 NOTE — Anesthesia Procedure Notes (Signed)
 Procedure Name: Intubation Date/Time: 01/12/2024 7:32 AM  Performed by: Emilio Rock BIRCH, CRNAPre-anesthesia Checklist: Patient identified, Emergency Drugs available, Suction available and Patient being monitored Patient Re-evaluated:Patient Re-evaluated prior to induction Oxygen Delivery Method: Circle system utilized Preoxygenation: Pre-oxygenation with 100% oxygen Induction Type: IV induction Ventilation: Mask ventilation without difficulty Laryngoscope Size: Mac and 3 Grade View: Grade I Tube type: Oral Number of attempts: 1 Airway Equipment and Method: Stylet and Oral airway Placement Confirmation: ETT inserted through vocal cords under direct vision, positive ETCO2 and breath sounds checked- equal and bilateral Secured at: 22 cm Tube secured with: Tape Dental Injury: Teeth and Oropharynx as per pre-operative assessment

## 2024-01-12 NOTE — Op Note (Signed)
 DATE OF OPERATION: 01/12/2024   LOCATION: [facility] operating Room   PREOPERATIVE DIAGNOSIS: Symptomatic macromastia   POSTOPERATIVE DIAGNOSIS: Same   PROCEDURE: Bilateral breast reduction   SURGEON: Marinell Birmingham, MD   ASSISTANT: Honora Seip   EBL: 75 cc   CONDITION: Stable   COMPLICATIONS: None   INDICATION: The patient, Peggy Garza, is a 23 y.o. female born on May 03, 2000, is here for treatment upper back and neck pain secondary to large breast size.     PROCEDURE DETAILS:  The patient was seen prior to surgery and marked.  IV antibiotics were given. The patient was taken to the operating room,SCDs were placed, and given a general anesthetic. A standard time out was performed and all information was confirmed by those in the room.    The chest was prepped and draped in usual sterile manner.  A 42 mm cookie cutter was used to outline the proposed nipple areolar complexes bilaterally and an 8 cm based inferior pedicle was outlined on each breast.  The right breast was addressed first.  Laparotomy tape was placed at the base of breast as a tourniquet and the pedicle was de-epithelialized sharply.  Electrocautery was used to dissect the borders of the pedicle to the chest wall.  Electrocautery was then used to resect the medial lateral and superior triangles of breast tissue tissue and to develop within the superior skin flap.  The breast tissue removed constituted the bulk of the reduction and weighed 628gms.  The subfascial space over the pectoralis muscle and the subcutaneous tissues were infiltrated with 50 mL of a mixture of Exparel , quarter percent plain Marcaine , and saline.  The surgical wound was irrigated and hemostasis ensured with electrocautery.  A 19 French round drain was placed behind the pedicle and brought out through a separate stab incision.  The T point was approximated with a single 2-0 Monocryl suture and the skin edges were tailor tacked in place with  skin clips.  Dermis was approximated with interrupted and running 3-0 Monocryl sutures and the skin was closed with a running 4-0 Monocryl subcuticular stitch.  Attention was turned to the left breast where similar procedure was performed.  After placing a laparotomy tape at the base of the breast as a tourniquet the pedicle was de-epithelialized sharply and dissected to the chest wall with electrocautery.  Medial lateral and superior triangles of breast tissue were resected and the superior skin flap was developed and thinned.  The breast tissue removed constituted the bulk of the reduction and weighed 782 gms.  All breast tissue was sent to pathology for routine examination.  Again the subfascial space was infiltrated with the Exparel  mixture and the surgical bed irrigated.  After ensuring hemostasis a 19 French round drain was placed behind the pedicle and brought out through a separate stab incision. The T point was approximated with a 2-0 Monocryl suture and the skin edges were tailor tacked in place with skin clips.  Dermis was closed with interrupted and running 3-0 Monocryl sutures and skin was closed with running 4-0 Monocryl subcuticular stitch.  All incisions were then sealed with Dermabond the patient was placed in a supportive, compressive garment.  She was awakened from anesthesia without incident and transferred to the recovery room in good condition.  All instrument needle and sponge counts were reported as correct and there were no complications appreciated during the procedure. The patient was allowed to wake up and taken to recovery room in stable condition at  the end of the case. The family was notified at the end of the case.    The advanced practice practitioner (APP) assisted throughout the case.  The APP was essential in retraction and counter traction when needed to make the case progress smoothly.  This retraction and assistance made it possible to see the tissue plans for the procedure.   The assistance was needed for blood control, tissue re-approximation and assisted with closure of the incision site.

## 2024-01-12 NOTE — Discharge Instructions (Addendum)
 Activity: Avoid strenuous activity.  No lifting, pushing, or pulling greater than 15 pounds.  Diet: No restrictions.  Try to optimize nutrition with plenty of proteins, fruits, and vegetables to improve healing.   Wound Care: Leave breast binder on for the first week and then you may transition to a front-clipping or front-zipping compression bra.  Sponge bathe only until our visit tomorrow, then we can discuss transition to showering with the emphasis on keeping the surgical site dry.  Replace the ABD pads over incisions with gauze or Maxi pads as needed for incisional drainage.   If you have drains placed, be sure to record the daily output from each side. They will be removed at your appointment tomorrow. Please make sure that the bulbs are charged.  If you experience any issues, please call the office.    Follow-Up: Scheduled for tomorrow.  Things to watch for:  Call the office if you experience fever, chills, intractable vomiting, or significant bleeding.  Mild wound drainage is common after breast reduction surgery and should not be cause for alarm.  No Tylenol  before 4:30pm today.   Post Anesthesia Home Care Instructions  Activity: Get plenty of rest for the remainder of the day. A responsible individual must stay with you for 24 hours following the procedure.  For the next 24 hours, DO NOT: -Drive a car -Advertising copywriter -Drink alcoholic beverages -Take any medication unless instructed by your physician -Make any legal decisions or sign important papers.  Meals: Start with liquid foods such as gelatin or soup. Progress to regular foods as tolerated. Avoid greasy, spicy, heavy foods. If nausea and/or vomiting occur, drink only clear liquids until the nausea and/or vomiting subsides. Call your physician if vomiting continues.  Special Instructions/Symptoms: Your throat may feel dry or sore from the anesthesia or the breathing tube placed in your throat during surgery. If this  causes discomfort, gargle with warm salt water. The discomfort should disappear within 24 hours.  If you had a scopolamine patch placed behind your ear for the management of post- operative nausea and/or vomiting:  1. The medication in the patch is effective for 72 hours, after which it should be removed.  Wrap patch in a tissue and discard in the trash. Wash hands thoroughly with soap and water. 2. You may remove the patch earlier than 72 hours if you experience unpleasant side effects which may include dry mouth, dizziness or visual disturbances. 3. Avoid touching the patch. Wash your hands with soap and water after contact with the patch.   Information for Discharge Teaching: EXPAREL  (bupivacaine  liposome injectable suspension)   Pain relief is important to your recovery. The goal is to control your pain so you can move easier and return to your normal activities as soon as possible after your procedure. Your physician may use several types of medicines to manage pain, swelling, and more.  Your surgeon or anesthesiologist gave you EXPAREL (bupivacaine ) to help control your pain after surgery.  EXPAREL  is a local anesthetic designed to release slowly over an extended period of time to provide pain relief by numbing the tissue around the surgical site. EXPAREL  is designed to release pain medication over time and can control pain for up to 72 hours. Depending on how you respond to EXPAREL , you may require less pain medication during your recovery. EXPAREL  can help reduce or eliminate the need for opioids during the first few days after surgery when pain relief is needed the most. EXPAREL  is not an opioid  and is not addictive. It does not cause sleepiness or sedation.   Important! A teal colored band has been placed on your arm with the date, time and amount of EXPAREL  you have received. Please leave this armband in place for the full 96 hours following administration, and then you may remove the  band. If you return to the hospital for any reason within 96 hours following the administration of EXPAREL , the armband provides important information that your health care providers to know, and alerts them that you have received this anesthetic.    Possible side effects of EXPAREL : Temporary loss of sensation or ability to move in the area where medication was injected. Nausea, vomiting, constipation Rarely, numbness and tingling in your mouth or lips, lightheadedness, or anxiety may occur. Call your doctor right away if you think you may be experiencing any of these sensations, or if you have other questions regarding possible side effects.  Follow all other discharge instructions given to you by your surgeon or nurse. Eat a healthy diet and drink plenty of water or other fluids.  You had 5mg  of Oxycodone  at 12:44pm today.

## 2024-01-12 NOTE — Transfer of Care (Signed)
 Immediate Anesthesia Transfer of Care Note  Patient: Peggy Garza  Procedure(s) Performed: MAMMOPLASTY, REDUCTION (Bilateral: Breast)  Patient Location: PACU  Anesthesia Type:General  Level of Consciousness: sedated  Airway & Oxygen Therapy: Patient Spontanous Breathing and Patient connected to face mask oxygen  Post-op Assessment: Report given to RN and Post -op Vital signs reviewed and stable  Post vital signs: Reviewed and stable  Last Vitals:  Vitals Value Taken Time  BP    Temp    Pulse    Resp    SpO2      Last Pain:  Vitals:   01/12/24 0634  TempSrc: Temporal  PainSc: 0-No pain      Patients Stated Pain Goal: 3 (01/12/24 9365)  Complications: No notable events documented.

## 2024-01-12 NOTE — Anesthesia Postprocedure Evaluation (Signed)
 Anesthesia Post Note  Patient: Peggy Garza  Procedure(s) Performed: MAMMOPLASTY, REDUCTION (Bilateral: Breast)     Patient location during evaluation: PACU Anesthesia Type: General Level of consciousness: awake and alert Pain management: pain level controlled Vital Signs Assessment: post-procedure vital signs reviewed and stable Respiratory status: spontaneous breathing, nonlabored ventilation and respiratory function stable Cardiovascular status: stable and blood pressure returned to baseline Anesthetic complications: no   No notable events documented.  Last Vitals:  Vitals:   01/12/24 1215 01/12/24 1223  BP: 103/65   Pulse: (!) 57 78  Resp: 13 16  Temp:    SpO2: 99% 98%    Last Pain:  Vitals:   01/12/24 1223  TempSrc:   PainSc: 4                  Debby FORBES Like

## 2024-01-13 ENCOUNTER — Encounter: Admitting: Physician Assistant

## 2024-01-13 ENCOUNTER — Encounter (HOSPITAL_BASED_OUTPATIENT_CLINIC_OR_DEPARTMENT_OTHER): Payer: Self-pay | Admitting: Plastic Surgery

## 2024-01-13 ENCOUNTER — Ambulatory Visit: Admitting: Plastic Surgery

## 2024-01-13 VITALS — BP 96/92 | HR 60

## 2024-01-13 DIAGNOSIS — Z9889 Other specified postprocedural states: Secondary | ICD-10-CM

## 2024-01-13 NOTE — Progress Notes (Signed)
 Peggy Garza returns today postop day 1 after bilateral breast reduction.  She is uncomfortable but her pain is reasonably well-controlled.  She had no issues overnight.  She did have some pain between her breasts but this was intermittent and not associated with any shortness of breath.  She has no pain in her legs, no swelling in her legs.  Drains have put out a minimal amount of serosanguineous fluid.  Incisions are intact.  Nipples are warm and well-perfused.  Drains were removed today without difficulty.  I have encouraged her to increase her activity.  If she has any further pain in her chest even if it seems to be associated with the breast I have instructed her to proceed to the emergency department.  Follow-up next week.

## 2024-01-14 LAB — SURGICAL PATHOLOGY

## 2024-01-15 ENCOUNTER — Encounter: Payer: Self-pay | Admitting: Plastic Surgery

## 2024-01-18 ENCOUNTER — Ambulatory Visit: Admitting: Plastic Surgery

## 2024-01-18 DIAGNOSIS — Z9889 Other specified postprocedural states: Secondary | ICD-10-CM

## 2024-01-18 NOTE — Progress Notes (Signed)
 Ms. Bernardy returns today approximately 1 week postop from bilateral breast reduction.  She is doing very well with no complaints other than some hypersensitivity along the lateral aspect of the left breast.  She is tolerating diet, having bowel movements, with activity as desired.  On examination she has good shape and symmetry and the incisions are all clean dry and intact.  She has feeling in both nipples.  She does endorse some anesthesia on the upper portion of the breast.  Status post breast reduction: Doing well.  May slowly increase activity as tolerated.  Discussed use of scar tape for the incisions.  Keep scheduled follow-up appointment.

## 2024-01-21 ENCOUNTER — Telehealth (INDEPENDENT_AMBULATORY_CARE_PROVIDER_SITE_OTHER): Admitting: Physician Assistant

## 2024-01-21 DIAGNOSIS — Z9889 Other specified postprocedural states: Secondary | ICD-10-CM

## 2024-01-21 NOTE — Telephone Encounter (Signed)
 Patient had requested FMLA/STD paperwork.  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  Called the patient to discuss the forms and she stated that it is no longer required.  Paperwork discarded.  Continue to follow-up for postoperative appointments as scheduled.

## 2024-02-01 ENCOUNTER — Ambulatory Visit: Admitting: Physician Assistant

## 2024-02-01 VITALS — BP 109/73 | HR 68

## 2024-02-01 DIAGNOSIS — Z9889 Other specified postprocedural states: Secondary | ICD-10-CM

## 2024-02-01 NOTE — Progress Notes (Signed)
 Patient is a pleasant 23 year old female s/p bilateral breast reduction performed 01/12/2024 by Dr. Waddell who presents to clinic for postoperative follow-up.  She was last seen here in clinic on 01/18/2024.  At that time, her exam was benign.    Today, patient is doing well.  Mild soreness, but taking Tylenol  and ibuprofen.  She denies any leg swelling, difficulty breathing, fevers, or exertional chest pain.  She states that she does have chest soreness/tenderness that is relieved when she takes off her compressive bra.  Overall she is pleased with the results from a functional standpoint and notes an improvement in her upper back/neck discomfort.  It is an adjustment for her from a cosmetic standpoint as she is used to having larger breasts.  On exam, breasts have excellent shape and symmetry.  NAC's are healthy, well-perfused.  Incisions CDI throughout, but there is a moderate amount of residual overlying Dermabond.  Breasts are soft without discrete areas of firmness.  No overlying skin changes.  Recommending a moderate amount of Vaseline application daily to help with residual Dermabond.  This can be covered with ABD pads and secured with compressive bra or binder.  Follow-up in 2 weeks for likely final postoperative encounter.  At that time, we will likely transition her to silicone scar gel versus tape.  She can call the office should she have any questions or concerns in interim.

## 2024-02-17 ENCOUNTER — Encounter: Admitting: Student

## 2024-02-25 ENCOUNTER — Ambulatory Visit: Admitting: Family Medicine

## 2024-02-25 ENCOUNTER — Other Ambulatory Visit (HOSPITAL_COMMUNITY)
Admission: RE | Admit: 2024-02-25 | Discharge: 2024-02-25 | Disposition: A | Discharge status: Home / Self Care | Source: Ambulatory Visit | Attending: Family Medicine | Admitting: Family Medicine

## 2024-02-25 VITALS — BP 110/82 | HR 75 | Temp 98.1°F | Ht 62.0 in | Wt 214.8 lb

## 2024-02-25 DIAGNOSIS — R739 Hyperglycemia, unspecified: Secondary | ICD-10-CM

## 2024-02-25 DIAGNOSIS — Z6839 Body mass index (BMI) 39.0-39.9, adult: Secondary | ICD-10-CM | POA: Diagnosis not present

## 2024-02-25 DIAGNOSIS — Z0000 Encounter for general adult medical examination without abnormal findings: Secondary | ICD-10-CM

## 2024-02-25 DIAGNOSIS — E66812 Obesity, class 2: Secondary | ICD-10-CM

## 2024-02-25 DIAGNOSIS — Z113 Encounter for screening for infections with a predominantly sexual mode of transmission: Secondary | ICD-10-CM

## 2024-02-25 DIAGNOSIS — K5909 Other constipation: Secondary | ICD-10-CM | POA: Diagnosis not present

## 2024-02-25 DIAGNOSIS — Z79899 Other long term (current) drug therapy: Secondary | ICD-10-CM

## 2024-02-25 DIAGNOSIS — E559 Vitamin D deficiency, unspecified: Secondary | ICD-10-CM | POA: Diagnosis not present

## 2024-02-25 DIAGNOSIS — Z0001 Encounter for general adult medical examination with abnormal findings: Secondary | ICD-10-CM | POA: Diagnosis not present

## 2024-02-25 DIAGNOSIS — E282 Polycystic ovarian syndrome: Secondary | ICD-10-CM

## 2024-02-25 DIAGNOSIS — G4733 Obstructive sleep apnea (adult) (pediatric): Secondary | ICD-10-CM

## 2024-02-25 DIAGNOSIS — Z136 Encounter for screening for cardiovascular disorders: Secondary | ICD-10-CM | POA: Diagnosis not present

## 2024-02-25 LAB — COMPREHENSIVE METABOLIC PANEL WITH GFR
ALT: 7 U/L (ref 3–35)
AST: 12 U/L (ref 5–37)
Albumin: 4.2 g/dL (ref 3.5–5.2)
Alkaline Phosphatase: 50 U/L (ref 39–117)
BUN: 11 mg/dL (ref 6–23)
CO2: 22 meq/L (ref 19–32)
Calcium: 9.2 mg/dL (ref 8.4–10.5)
Chloride: 110 meq/L (ref 96–112)
Creatinine, Ser: 0.93 mg/dL (ref 0.40–1.20)
GFR: 85.38 mL/min
Glucose, Bld: 69 mg/dL — ABNORMAL LOW (ref 70–99)
Potassium: 3.8 meq/L (ref 3.5–5.1)
Sodium: 139 meq/L (ref 135–145)
Total Bilirubin: 0.2 mg/dL (ref 0.2–1.2)
Total Protein: 8.2 g/dL (ref 6.0–8.3)

## 2024-02-25 LAB — CBC WITH DIFFERENTIAL/PLATELET
Basophils Absolute: 0 K/uL (ref 0.0–0.1)
Basophils Relative: 0.7 % (ref 0.0–3.0)
Eosinophils Absolute: 0.2 K/uL (ref 0.0–0.7)
Eosinophils Relative: 3.7 % (ref 0.0–5.0)
HCT: 37.5 % (ref 36.0–46.0)
Hemoglobin: 12.5 g/dL (ref 12.0–15.0)
Lymphocytes Relative: 47 % — ABNORMAL HIGH (ref 12.0–46.0)
Lymphs Abs: 2.4 K/uL (ref 0.7–4.0)
MCHC: 33.3 g/dL (ref 30.0–36.0)
MCV: 84.9 fl (ref 78.0–100.0)
Monocytes Absolute: 0.4 K/uL (ref 0.1–1.0)
Monocytes Relative: 7.9 % (ref 3.0–12.0)
Neutro Abs: 2.1 K/uL (ref 1.4–7.7)
Neutrophils Relative %: 40.7 % — ABNORMAL LOW (ref 43.0–77.0)
Platelets: 239 K/uL (ref 150.0–400.0)
RBC: 4.42 Mil/uL (ref 3.87–5.11)
RDW: 13.9 % (ref 11.5–15.5)
WBC: 5.1 K/uL (ref 4.0–10.5)

## 2024-02-25 LAB — TSH: TSH: 0.41 u[IU]/mL (ref 0.35–5.50)

## 2024-02-25 LAB — LIPID PANEL
Cholesterol: 159 mg/dL (ref 28–200)
HDL: 52.7 mg/dL
LDL Cholesterol: 66 mg/dL (ref 10–99)
NonHDL: 106.79
Total CHOL/HDL Ratio: 3
Triglycerides: 205 mg/dL — ABNORMAL HIGH (ref 10.0–149.0)
VLDL: 41 mg/dL — ABNORMAL HIGH (ref 0.0–40.0)

## 2024-02-25 LAB — VITAMIN B12: Vitamin B-12: 239 pg/mL (ref 211–911)

## 2024-02-25 LAB — VITAMIN D 25 HYDROXY (VIT D DEFICIENCY, FRACTURES): VITD: 23.37 ng/mL — ABNORMAL LOW (ref 30.00–100.00)

## 2024-02-25 MED ORDER — VITAMIN D3 50 MCG (2000 UT) PO CAPS: 2000.0000 [IU] | ORAL_CAPSULE | Freq: Every day | ORAL | 1 refills | Status: AC

## 2024-02-25 NOTE — Patient Instructions (Signed)
 We have completed your physical today.  May try magnesium, the higher dosage on the bottle that you can get over the counter.   We are checking labs today, will be in contact with any results that require further attention.  Follow up with me in a year for physical, sooner if needed.

## 2024-02-25 NOTE — Progress Notes (Signed)
 "  Annual Physical Exam  Subjective:     Patient ID: Peggy Garza, female    DOB: [birth year], 24 y.o.   MRN: [id number]  No chief complaint on file.   HPI  Discussed the use of AI scribe software for clinical note transcription with the patient, who gave verbal consent to proceed.  History of Present Illness Peggy Garza is a 24 year old female who presents for an annual physical exam and follow-up on previous medical issues.  Postoperative status - Significant improvement in overall well-being following recent surgery.  Anemia evaluation - Requests iron testing due to concern for anemia.  Weight management - Weight is improving. - Interested in medication to support further weight loss.  Sexually transmitted infections - Syphilis diagnosed in September after prior negative STD panel; treated at health department. - Prior partner tested negative for syphilis. - Chlamydia exposure from a partner with chlamydia and gonorrhea; treated with doxycycline and an injection. - Not currently sexually active. - Prefers STD screening as needed rather than routine testing.  Bowel habits - Bowel movements are small, hard, and pellet-like. - Straining present with defecation. - No infrequent stools.  General well-being - Good appetite. - Sleep is adequate.     ROS Per HPI  Most recent fall risk assessment:    07/21/2023    9:04 AM  Fall Risk   Falls in the past year? 0  Injury with Fall? 0      Data saved with a previous flowsheet row definition    Most recent depression screenings:    02/25/2024    3:17 PM 11/25/2023    5:12 PM  PHQ 2/9 Scores  PHQ - 2 Score 0 0  PHQ- 9 Score 6 2      Data saved with a previous flowsheet row definition    Vision:Within last year, Dental: No current dental problems and Receives regular dental care, and STD: The patient reports a past history of: syphilis, gonorrhea and has completed treatment for both  Patient Care  Team: Alvia Corean CROME, FNP as PCP - General (Family Medicine)   Show/hide medication list[1]     Objective:    BP 110/82 (BP Location: Left Arm, Patient Position: Sitting)   Pulse 75   Temp 98.1 F (36.7 C) (Temporal)   Ht 5' 2 (1.575 m)   Wt 214 lb 12.8 oz (97.4 kg)   SpO2 99%   BMI 39.29 kg/m    Physical Exam Vitals and nursing note reviewed.  Constitutional:      General: She is not in acute distress.    Appearance: Normal appearance. She is obese.  HENT:     Head: Normocephalic and atraumatic.     Right Ear: External ear normal.     Left Ear: External ear normal.     Nose: Nose normal.     Mouth/Throat:     Mouth: Mucous membranes are moist.     Pharynx: Oropharynx is clear.  Eyes:     Extraocular Movements: Extraocular movements intact.     Pupils: Pupils are equal, round, and reactive to light.  Cardiovascular:     Rate and Rhythm: Normal rate and regular rhythm.     Pulses: Normal pulses.     Heart sounds: Normal heart sounds.  Pulmonary:     Effort: Pulmonary effort is normal. No respiratory distress.     Breath sounds: Normal breath sounds. No wheezing, rhonchi or rales.  Musculoskeletal:  General: Normal range of motion.     Cervical back: Normal range of motion.     Right lower leg: No edema.     Left lower leg: No edema.  Lymphadenopathy:     Cervical: No cervical adenopathy.  Neurological:     General: No focal deficit present.     Mental Status: She is alert and oriented to person, place, and time.  Psychiatric:        Mood and Affect: Mood normal.        Thought Content: Thought content normal.     Results for orders placed or performed in visit on 02/25/24  CBC with Differential/Platelet  Result Value Ref Range   WBC 5.1 4.0 - 10.5 K/uL   RBC 4.42 3.87 - 5.11 Mil/uL   Hemoglobin 12.5 12.0 - 15.0 g/dL   HCT 62.4 63.9 - 53.9 %   MCV 84.9 78.0 - 100.0 fl   MCHC 33.3 30.0 - 36.0 g/dL   RDW 86.0 88.4 - 84.4 %   Platelets  239.0 150.0 - 400.0 K/uL   Neutrophils Relative % 40.7 (L) 43.0 - 77.0 %   Lymphocytes Relative 47.0 (H) 12.0 - 46.0 %   Monocytes Relative 7.9 3.0 - 12.0 %   Eosinophils Relative 3.7 0.0 - 5.0 %   Basophils Relative 0.7 0.0 - 3.0 %   Neutro Abs 2.1 1.4 - 7.7 K/uL   Lymphs Abs 2.4 0.7 - 4.0 K/uL   Monocytes Absolute 0.4 0.1 - 1.0 K/uL   Eosinophils Absolute 0.2 0.0 - 0.7 K/uL   Basophils Absolute 0.0 0.0 - 0.1 K/uL  Comprehensive metabolic panel with GFR  Result Value Ref Range   Sodium 139 135 - 145 mEq/L   Potassium 3.8 3.5 - 5.1 mEq/L   Chloride 110 96 - 112 mEq/L   CO2 22 19 - 32 mEq/L   Glucose, Bld 69 (L) 70 - 99 mg/dL   BUN 11 6 - 23 mg/dL   Creatinine, Ser 9.06 0.40 - 1.20 mg/dL   Total Bilirubin 0.2 0.2 - 1.2 mg/dL   Alkaline Phosphatase 50 39 - 117 U/L   AST 12 5 - 37 U/L   ALT 7 3 - 35 U/L   Total Protein 8.2 6.0 - 8.3 g/dL   Albumin 4.2 3.5 - 5.2 g/dL   GFR 14.61 >39.99 mL/min   Calcium 9.2 8.4 - 10.5 mg/dL  Lipid panel  Result Value Ref Range   Cholesterol 159 28 - 200 mg/dL   Triglycerides 794.9 (H) 10.0 - 149.0 mg/dL   HDL 47.29 >60.99 mg/dL   VLDL 58.9 (H) 0.0 - 59.9 mg/dL   LDL Cholesterol 66 10 - 99 mg/dL   Total CHOL/HDL Ratio 3    NonHDL 106.79   TSH  Result Value Ref Range   TSH 0.41 0.35 - 5.50 uIU/mL  VITAMIN D  25 Hydroxy (Vit-D Deficiency, Fractures)  Result Value Ref Range   VITD 23.37 (L) 30.00 - 100.00 ng/mL  Vitamin B12  Result Value Ref Range   Vitamin B-12 239 211 - 911 pg/mL  HIV antibody (with reflex)  Result Value Ref Range   HIV FINAL INTERPRETATION HIV NEGATIVE    HIV 1&2 Ab, 4th Generation NON-REACTIVE NON-REACTIVE  RPR W/RFLX TO RPR TITER, TREPONEMAL AB, SCREEN AND DIAGNOSIS  Result Value Ref Range   RPR Ser Ql NON-REACTIVE NON-REACTIVE  Hepatitis C Antibody  Result Value Ref Range   Hepatitis C Ab NON-REACTIVE NON-REACTIVE  Cervicovaginal ancillary only  Result Value  Ref Range   Neisseria Gonorrhea Negative    Chlamydia  Negative    Trichomonas Negative    Bacterial Vaginitis (gardnerella) Negative    Candida Vaginitis Negative    Candida Glabrata Negative    Comment Normal Reference Ranger Chlamydia - Negative    Comment      Normal Reference Range Neisseria Gonorrhea - Negative   Comment Normal Reference Range Candida Species - Negative    Comment Normal Reference Range Candida Galbrata - Negative    Comment Normal Reference Range Trichomonas - Negative    Comment      Normal Reference Range Bacterial Vaginosis - Negative    BP Readings from Last 3 Encounters:  02/26/24 104/68  02/25/24 110/82  02/01/24 109/73   Wt Readings from Last 3 Encounters:  02/25/24 214 lb 12.8 oz (97.4 kg)  01/12/24 216 lb 11.4 oz (98.3 kg)  12/23/23 218 lb (98.9 kg)      Last CBC Lab Results  Component Value Date   WBC 5.1 02/25/2024   HGB 12.5 02/25/2024   HCT 37.5 02/25/2024   MCV 84.9 02/25/2024   MCH 27.0 03/27/2022   RDW 13.9 02/25/2024   PLT 239.0 02/25/2024   Last metabolic panel Lab Results  Component Value Date   GLUCOSE 69 (L) 02/25/2024   NA 139 02/25/2024   K 3.8 02/25/2024   CL 110 02/25/2024   CO2 22 02/25/2024   BUN 11 02/25/2024   CREATININE 0.93 02/25/2024   GFR 85.38 02/25/2024   CALCIUM 9.2 02/25/2024   PROT 8.2 02/25/2024   ALBUMIN 4.2 02/25/2024   LABGLOB 3.4 06/12/2021   AGRATIO 1.0 (L) 06/12/2021   BILITOT 0.2 02/25/2024   ALKPHOS 50 02/25/2024   AST 12 02/25/2024   ALT 7 02/25/2024   Last lipids Lab Results  Component Value Date   CHOL 159 02/25/2024   HDL 52.70 02/25/2024   LDLCALC 66 02/25/2024   TRIG 205.0 (H) 02/25/2024   CHOLHDL 3 02/25/2024   Last hemoglobin A1c Lab Results  Component Value Date   HGBA1C 5.8 (H) 07/16/2023   Last thyroid  functions Lab Results  Component Value Date   TSH 0.41 02/25/2024   T3TOTAL 154 03/06/2020   T4TOTAL 9.5 03/06/2020   Last vitamin D  Lab Results  Component Value Date   VD25OH 23.37 (L) 02/25/2024   Last  vitamin B12 and Folate Lab Results  Component Value Date   VITAMINB12 239 02/25/2024   FOLATE 16.4 03/06/2020      No results found.     Assessment & Plan:   Assessment and Plan Assessment & Plan General Health Maintenance Routine health maintenance discussed, including lab work and STD screening.  - Ordered labs including A1c, thyroid , iron, liver, kidneys, blood sugar, B12, and STD panel.  Vitamin D  deficiency Requiring supplementation. - Continue vitamin D  supplementation.  Class II severe obesity due to excess calories with serious comorbidity and BMI 39 Management discussed with a focus on weight loss. She is interested in Wegovy  but faces insurance coverage issues. Current weight is on the edge of BMI categories. She aims to lose 30 more pounds, which is reasonable for her height. -Comorbid PCOS, OSA - Continue to monitor weight and BMI. - Discussed insurance coverage for Wegovy .  OSA - Chronic, stable, requires ongoing monitoring -Will likely improve with weight loss  PCOS, hyperglycemia - History of ovarian cyst, severe obesity, hirsutism - CMP, A1c, TSH today  Constipation Reports constipation with hard, tablet-like stools. Magnesium supplementation suggested as  a natural remedy, preferred over Miralax due to fewer side effects. - Recommended magnesium supplementation at night.  STD screening -Will run panel today - Discussed STD screening schedule; she prefers to call when needed.     Health Maintenance  Topic Date Due   Pneumococcal Vaccine (1 of 2 - PCV) 07/26/2017   COVID-19 Vaccine (2 - Moderna risk series) 07/10/2019   Flu Shot  05/10/2024*   Pap Smear  07/16/2026   DTaP/Tdap/Td vaccine (8 - Td or Tdap) 03/27/2029   Hepatitis B Vaccine  Completed   HPV Vaccine  Completed   Hepatitis C Screening  Completed   HIV Screening  Completed   Meningitis B Vaccine  Aged Out  *Topic was postponed. The date shown is not the original due date.      Discussed health benefits of physical activity, and encouraged her to engage in regular exercise appropriate for her age and condition.  Orders Placed This Encounter  Procedures   CBC with Differential/Platelet    Release to patient:   Immediate [1]   Comprehensive metabolic panel with GFR    Release to patient:   Immediate [1]   Lipid panel   TSH   VITAMIN D  25 Hydroxy (Vit-D Deficiency, Fractures)   Vitamin B12   HIV antibody (with reflex)   RPR W/RFLX TO RPR TITER, TREPONEMAL AB, SCREEN AND DIAGNOSIS   Hepatitis C Antibody     Meds ordered this encounter  Medications   Cholecalciferol (VITAMIN D3) 50 MCG (2000 UT) capsule    Sig: Take 1 capsule (2,000 Units total) by mouth daily.    Dispense:  90 capsule    Refill:  1    Return in about 1 year (around 02/24/2025) for CPE.  Corean LITTIE Ku, FNP     [1]  Outpatient Medications Prior to Visit  Medication Sig   acyclovir (ZOVIRAX) 200 MG/5ML suspension Take 800 mg by mouth 3 (three) times daily as needed (Outbreak).   albuterol  (VENTOLIN  HFA) 108 (90 Base) MCG/ACT inhaler Inhale 2 puffs into the lungs every 6 (six) hours as needed for wheezing or shortness of breath (Cough).   estradiol  (ESTRACE ) 1 MG tablet Take 1 tablet (1 mg total) by mouth daily.   medroxyPROGESTERone  (PROVERA ) 10 MG tablet Take 1 tablet (10 mg total) by mouth daily. Use for ten days   meloxicam  (MOBIC ) 15 MG tablet Take 1 tablet (15 mg total) by mouth daily.   ondansetron  (ZOFRAN -ODT) 4 MG disintegrating tablet Take 1 tablet (4 mg total) by mouth every 8 (eight) hours as needed for nausea or vomiting.   topiramate  (TOPAMAX ) 100 MG tablet Take 1 tablet (100 mg total) by mouth at bedtime.   [DISCONTINUED] Cholecalciferol (VITAMIN D3) 50 MCG (2000 UT) capsule Take 1 capsule (2,000 Units total) by mouth daily.   No facility-administered medications prior to visit.   "

## 2024-02-26 ENCOUNTER — Encounter: Payer: Self-pay | Admitting: Student

## 2024-02-26 ENCOUNTER — Ambulatory Visit: Admitting: Student

## 2024-02-26 VITALS — BP 104/68 | HR 83

## 2024-02-26 DIAGNOSIS — Z9889 Other specified postprocedural states: Secondary | ICD-10-CM

## 2024-02-26 LAB — CERVICOVAGINAL ANCILLARY ONLY
Bacterial Vaginitis (gardnerella): NEGATIVE
Candida Glabrata: NEGATIVE
Candida Vaginitis: NEGATIVE
Chlamydia: NEGATIVE
Comment: NEGATIVE
Comment: NEGATIVE
Comment: NEGATIVE
Comment: NEGATIVE
Comment: NEGATIVE
Comment: NORMAL
Neisseria Gonorrhea: NEGATIVE
Trichomonas: NEGATIVE

## 2024-02-26 LAB — HEPATITIS C ANTIBODY: Hepatitis C Ab: NONREACTIVE

## 2024-02-26 LAB — SYPHILIS: RPR W/REFLEX TO RPR TITER AND TREPONEMAL ANTIBODIES, TRADITIONAL SCREENING AND DIAGNOSIS ALGORITHM: RPR Ser Ql: NONREACTIVE

## 2024-02-26 LAB — HIV ANTIBODY (ROUTINE TESTING W REFLEX)
HIV 1&2 Ab, 4th Generation: NONREACTIVE
HIV FINAL INTERPRETATION: NEGATIVE

## 2024-02-26 NOTE — Progress Notes (Signed)
 Patient is a 24 year old female who recently underwent bilateral breast reduction with Dr. Waddell on 01/12/2024.  Patient is a little over 6 weeks postop.  She presents to the clinic today for postoperative follow-up.  Patient was last seen in the clinic on 02/01/2024.  At this visit, patient was doing well.  She did report some mild soreness, but was taking Tylenol  and ibuprofen.  Patient was overall pleased with her results from a functional standpoint.  On exam, breast had excellent shape and symmetry.  NAC's were healthy and well-perfused.  Incisions were CDI throughout.    Today, patient reports she is doing really well.  She denies any new issues or concerns.  She does not report any infectious symptoms.  Chaperone present on exam.  On exam, patient sitting upright in no acute distress.  Breasts are overall soft and symmetric.  There is a little bit of firmness noted to the right lateral breast consistent with scar tissue.  There is no overlying erythema, no overlying skin changes.  NAC's appear to be healthy bilaterally.  Incisions appear to be intact and overall healing well.  There were a few Monocryl sutures that were cut and removed.  Patient tolerated well.  There are no signs of infection on exam.  Discussed with the patient that she overall seems to be healing well from her breast reduction surgery.  Recommend that she apply Vaseline to her incisions throughout for about another week, and then she may transition to scar creams or tapes that she would like.  Patient expressed understanding.  Discussed with the patient that she may transition into a regular bra with no underwire.  Discussed with the patient that she no longer has restrictions and may start increasing her activities.  Patient expressed understanding.  Did that patient gently massage the area of firmness to her lateral right breast.  I discussed with her that if this area becomes bigger or persist she should let us  know.   Patient expressed understanding.  Patient to follow-up as needed.  Instructed her to call with any questions or concerns.  Pictures were obtained of the patient and placed in the chart with the patient's or guardian's permission.

## 2024-02-29 ENCOUNTER — Encounter: Payer: Self-pay | Admitting: Family Medicine

## 2024-02-29 ENCOUNTER — Encounter: Admitting: Family Medicine

## 2024-02-29 ENCOUNTER — Encounter: Admitting: Student

## 2024-03-09 ENCOUNTER — Ambulatory Visit: Payer: Self-pay | Admitting: Family Medicine

## 2024-03-17 ENCOUNTER — Other Ambulatory Visit: Payer: Self-pay

## 2024-03-17 MED ORDER — TOPIRAMATE 100 MG PO TABS: 100.0000 mg | ORAL_TABLET | Freq: Every day | ORAL | 3 refills | Status: AC
